# Patient Record
Sex: Male | Born: 1940
Health system: Southern US, Community
[De-identification: ages and names within clinical notes are randomized; demographics above are authoritative.]

## PROBLEM LIST (undated history)

## (undated) DIAGNOSIS — N200 Calculus of kidney: Secondary | ICD-10-CM

## (undated) DIAGNOSIS — G4700 Insomnia, unspecified: Secondary | ICD-10-CM

## (undated) DIAGNOSIS — K5792 Diverticulitis of intestine, part unspecified, without perforation or abscess without bleeding: Secondary | ICD-10-CM

## (undated) DIAGNOSIS — K5780 Diverticulitis of intestine, part unspecified, with perforation and abscess without bleeding: Secondary | ICD-10-CM

## (undated) DIAGNOSIS — Z95828 Presence of other vascular implants and grafts: Secondary | ICD-10-CM

## (undated) DIAGNOSIS — S32010A Wedge compression fracture of first lumbar vertebra, initial encounter for closed fracture: Secondary | ICD-10-CM

## (undated) DIAGNOSIS — K7460 Unspecified cirrhosis of liver: Secondary | ICD-10-CM

## (undated) DIAGNOSIS — I82409 Acute embolism and thrombosis of unspecified deep veins of unspecified lower extremity: Secondary | ICD-10-CM

## (undated) DIAGNOSIS — Z87442 Personal history of urinary calculi: Secondary | ICD-10-CM

## (undated) DIAGNOSIS — Z960 Presence of urogenital implants: Secondary | ICD-10-CM

## (undated) DIAGNOSIS — E785 Hyperlipidemia, unspecified: Secondary | ICD-10-CM

## (undated) DIAGNOSIS — I10 Essential (primary) hypertension: Secondary | ICD-10-CM

## (undated) DIAGNOSIS — R160 Hepatomegaly, not elsewhere classified: Secondary | ICD-10-CM

## (undated) DIAGNOSIS — I639 Cerebral infarction, unspecified: Secondary | ICD-10-CM

## (undated) DIAGNOSIS — R0600 Dyspnea, unspecified: Secondary | ICD-10-CM

## (undated) DIAGNOSIS — K862 Cyst of pancreas: Secondary | ICD-10-CM

## (undated) DIAGNOSIS — I2699 Other pulmonary embolism without acute cor pulmonale: Secondary | ICD-10-CM

## (undated) DIAGNOSIS — D6869 Other thrombophilia: Secondary | ICD-10-CM

## (undated) HISTORY — PX: HERNIA REPAIR: SHX51

## (undated) HISTORY — PX: TONSILLECTOMY AND ADENOIDECTOMY: SHX28

## (undated) HISTORY — DX: Presence of other vascular implants and grafts: Z95.828

## (undated) HISTORY — DX: Essential (primary) hypertension: I10

## (undated) HISTORY — DX: Cerebral infarction, unspecified: I63.9

## (undated) HISTORY — DX: Calculus of kidney: N20.0

## (undated) HISTORY — DX: Hyperlipidemia, unspecified: E78.5

## (undated) HISTORY — PX: BACK SURGERY: SHX140

## (undated) HISTORY — PX: KIDNEY STONE SURGERY: SHX686

## (undated) HISTORY — PX: CARPAL TUNNEL RELEASE: SHX101

---
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
## 1999-02-20 ENCOUNTER — Encounter: Admission: RE | Admit: 1999-02-20 | Discharge: 1999-03-17 | Payer: Self-pay | Admitting: Anesthesiology

## 1999-05-09 ENCOUNTER — Encounter: Payer: Self-pay | Admitting: Orthopedic Surgery

## 1999-05-09 ENCOUNTER — Ambulatory Visit (HOSPITAL_COMMUNITY): Admission: RE | Admit: 1999-05-09 | Discharge: 1999-05-09 | Payer: Self-pay | Admitting: Orthopedic Surgery

## 1999-05-21 ENCOUNTER — Ambulatory Visit (HOSPITAL_COMMUNITY): Admission: RE | Admit: 1999-05-21 | Discharge: 1999-05-21 | Payer: Self-pay | Admitting: Orthopedic Surgery

## 1999-05-21 ENCOUNTER — Encounter: Payer: Self-pay | Admitting: Orthopedic Surgery

## 1999-06-04 ENCOUNTER — Ambulatory Visit (HOSPITAL_COMMUNITY): Admission: RE | Admit: 1999-06-04 | Discharge: 1999-06-04 | Payer: Self-pay | Admitting: Orthopedic Surgery

## 1999-06-04 ENCOUNTER — Encounter: Payer: Self-pay | Admitting: Orthopedic Surgery

## 1999-06-16 ENCOUNTER — Encounter: Payer: Self-pay | Admitting: Orthopedic Surgery

## 1999-06-16 ENCOUNTER — Ambulatory Visit (HOSPITAL_COMMUNITY): Admission: RE | Admit: 1999-06-16 | Discharge: 1999-06-16 | Payer: Self-pay | Admitting: Orthopedic Surgery

## 1999-08-05 ENCOUNTER — Ambulatory Visit (HOSPITAL_COMMUNITY): Admission: RE | Admit: 1999-08-05 | Discharge: 1999-08-05 | Payer: Self-pay | Admitting: Orthopedic Surgery

## 1999-08-05 ENCOUNTER — Encounter: Payer: Self-pay | Admitting: Orthopedic Surgery

## 1999-08-28 ENCOUNTER — Encounter: Payer: Self-pay | Admitting: Orthopedic Surgery

## 1999-08-28 ENCOUNTER — Ambulatory Visit (HOSPITAL_COMMUNITY): Admission: RE | Admit: 1999-08-28 | Discharge: 1999-08-28 | Payer: Self-pay | Admitting: Orthopedic Surgery

## 2000-01-06 DIAGNOSIS — I82409 Acute embolism and thrombosis of unspecified deep veins of unspecified lower extremity: Secondary | ICD-10-CM

## 2000-01-06 DIAGNOSIS — Z95828 Presence of other vascular implants and grafts: Secondary | ICD-10-CM

## 2000-01-06 DIAGNOSIS — I639 Cerebral infarction, unspecified: Secondary | ICD-10-CM

## 2000-01-06 DIAGNOSIS — I2699 Other pulmonary embolism without acute cor pulmonale: Secondary | ICD-10-CM

## 2000-01-06 HISTORY — DX: Presence of other vascular implants and grafts: Z95.828

## 2000-01-06 HISTORY — DX: Cerebral infarction, unspecified: I63.9

## 2000-01-06 HISTORY — DX: Acute embolism and thrombosis of unspecified deep veins of unspecified lower extremity: I82.409

## 2000-01-06 HISTORY — DX: Other pulmonary embolism without acute cor pulmonale: I26.99

## 2000-01-12 ENCOUNTER — Encounter: Payer: Self-pay | Admitting: Orthopedic Surgery

## 2000-01-13 ENCOUNTER — Inpatient Hospital Stay (HOSPITAL_COMMUNITY): Admission: RE | Admit: 2000-01-13 | Discharge: 2000-01-18 | Payer: Self-pay | Admitting: Orthopedic Surgery

## 2000-01-13 ENCOUNTER — Encounter: Payer: Self-pay | Admitting: Orthopedic Surgery

## 2000-07-30 ENCOUNTER — Inpatient Hospital Stay (HOSPITAL_COMMUNITY): Admission: EM | Admit: 2000-07-30 | Discharge: 2000-08-18 | Payer: Self-pay

## 2000-07-30 ENCOUNTER — Encounter: Payer: Self-pay | Admitting: Emergency Medicine

## 2000-07-31 ENCOUNTER — Encounter: Payer: Self-pay | Admitting: Internal Medicine

## 2000-08-01 ENCOUNTER — Encounter: Payer: Self-pay | Admitting: Internal Medicine

## 2000-08-02 ENCOUNTER — Encounter: Payer: Self-pay | Admitting: Internal Medicine

## 2000-08-03 ENCOUNTER — Encounter: Payer: Self-pay | Admitting: Internal Medicine

## 2000-08-04 ENCOUNTER — Encounter: Payer: Self-pay | Admitting: Internal Medicine

## 2000-08-04 ENCOUNTER — Encounter: Payer: Self-pay | Admitting: Oncology

## 2000-08-05 ENCOUNTER — Encounter: Payer: Self-pay | Admitting: Internal Medicine

## 2000-08-09 ENCOUNTER — Encounter: Payer: Self-pay | Admitting: Internal Medicine

## 2000-10-05 ENCOUNTER — Encounter: Admission: RE | Admit: 2000-10-05 | Discharge: 2001-01-03 | Payer: Self-pay | Admitting: Family Medicine

## 2001-01-22 ENCOUNTER — Emergency Department (HOSPITAL_COMMUNITY): Admission: EM | Admit: 2001-01-22 | Discharge: 2001-01-22 | Payer: Self-pay | Admitting: Emergency Medicine

## 2001-04-26 ENCOUNTER — Inpatient Hospital Stay (HOSPITAL_COMMUNITY): Admission: EM | Admit: 2001-04-26 | Discharge: 2001-05-06 | Payer: Self-pay | Admitting: Internal Medicine

## 2001-04-29 ENCOUNTER — Encounter: Payer: Self-pay | Admitting: Urology

## 2001-05-01 ENCOUNTER — Encounter: Payer: Self-pay | Admitting: Internal Medicine

## 2001-05-03 ENCOUNTER — Encounter: Payer: Self-pay | Admitting: Urology

## 2001-05-05 ENCOUNTER — Encounter: Payer: Self-pay | Admitting: Internal Medicine

## 2001-09-23 ENCOUNTER — Ambulatory Visit (HOSPITAL_COMMUNITY): Admission: RE | Admit: 2001-09-23 | Discharge: 2001-09-23 | Payer: Self-pay | Admitting: Orthopedic Surgery

## 2001-09-25 ENCOUNTER — Inpatient Hospital Stay (HOSPITAL_COMMUNITY): Admission: AD | Admit: 2001-09-25 | Discharge: 2001-10-01 | Payer: Self-pay | Admitting: Internal Medicine

## 2001-09-26 ENCOUNTER — Encounter: Payer: Self-pay | Admitting: Internal Medicine

## 2002-05-17 ENCOUNTER — Encounter: Payer: Self-pay | Admitting: Family Medicine

## 2002-05-17 ENCOUNTER — Ambulatory Visit (HOSPITAL_COMMUNITY): Admission: RE | Admit: 2002-05-17 | Discharge: 2002-05-17 | Payer: Self-pay | Admitting: Family Medicine

## 2003-04-13 ENCOUNTER — Emergency Department (HOSPITAL_COMMUNITY): Admission: EM | Admit: 2003-04-13 | Discharge: 2003-04-13 | Payer: Self-pay | Admitting: Emergency Medicine

## 2003-10-23 ENCOUNTER — Encounter: Admission: RE | Admit: 2003-10-23 | Discharge: 2003-12-11 | Payer: Self-pay | Admitting: Family Medicine

## 2006-11-27 ENCOUNTER — Emergency Department (HOSPITAL_COMMUNITY): Admission: EM | Admit: 2006-11-27 | Discharge: 2006-11-27 | Payer: Self-pay | Admitting: *Deleted

## 2008-01-06 HISTORY — PX: LITHOTRIPSY: SUR834

## 2008-01-17 ENCOUNTER — Encounter: Payer: Self-pay | Admitting: Urology

## 2008-01-17 ENCOUNTER — Ambulatory Visit (HOSPITAL_BASED_OUTPATIENT_CLINIC_OR_DEPARTMENT_OTHER): Admission: RE | Admit: 2008-01-17 | Discharge: 2008-01-17 | Payer: Self-pay | Admitting: Urology

## 2009-01-05 HISTORY — PX: OTHER SURGICAL HISTORY: SHX169

## 2009-06-09 ENCOUNTER — Inpatient Hospital Stay (HOSPITAL_COMMUNITY): Admission: EM | Admit: 2009-06-09 | Discharge: 2009-06-23 | Payer: Self-pay | Admitting: Emergency Medicine

## 2009-06-28 ENCOUNTER — Encounter: Admission: RE | Admit: 2009-06-28 | Discharge: 2009-06-28 | Payer: Self-pay | Admitting: Obstetrics and Gynecology

## 2009-07-12 ENCOUNTER — Encounter: Payer: Self-pay | Admitting: Surgery

## 2009-07-12 ENCOUNTER — Inpatient Hospital Stay (HOSPITAL_COMMUNITY): Admission: EM | Admit: 2009-07-12 | Discharge: 2009-07-26 | Payer: Self-pay | Admitting: Emergency Medicine

## 2009-07-19 ENCOUNTER — Encounter (INDEPENDENT_AMBULATORY_CARE_PROVIDER_SITE_OTHER): Payer: Self-pay | Admitting: Surgery

## 2009-07-19 HISTORY — PX: COLON SURGERY: SHX602

## 2010-01-31 LAB — COMPREHENSIVE METABOLIC PANEL
ALT: 52 U/L (ref 0–53)
Alkaline Phosphatase: 42 U/L (ref 39–117)
CO2: 28 mEq/L (ref 19–32)
Calcium: 9.8 mg/dL (ref 8.4–10.5)
Chloride: 105 mEq/L (ref 96–112)
GFR calc non Af Amer: 55 mL/min — ABNORMAL LOW (ref >60)
Total Bilirubin: 0.7 mg/dL (ref 0.3–1.2)
Total Protein: 7.5 g/dL (ref 6.0–8.3)

## 2010-01-31 LAB — CBC
Hemoglobin: 14.7 g/dL (ref 13.0–17.0)
MCH: 30.9 pg (ref 26.0–34.0)
MCV: 89.7 fL (ref 78.0–100.0)
RDW: 14.5 % (ref 11.5–15.5)
WBC: 8.5 10*3/uL (ref 4.0–10.5)

## 2010-01-31 LAB — DIFFERENTIAL
Eosinophils Absolute: 0.3 10*3/uL (ref 0.0–0.7)
Eosinophils Relative: 3 % (ref 0–5)
Lymphocytes Relative: 26 % (ref 12–46)
Monocytes Absolute: 0.7 10*3/uL (ref 0.1–1.0)
Neutrophils Relative %: 63 % (ref 43–77)

## 2010-01-31 LAB — SURGICAL PCR SCREEN: Staphylococcus aureus: POSITIVE — AB

## 2010-02-03 ENCOUNTER — Inpatient Hospital Stay (HOSPITAL_COMMUNITY)
Admission: RE | Admit: 2010-02-03 | Discharge: 2010-02-06 | DRG: 336 | Disposition: A | Discharge status: Home / Self Care | Payer: Medicare HMO | Attending: Surgery | Admitting: Surgery

## 2010-02-03 DIAGNOSIS — K432 Incisional hernia without obstruction or gangrene: Principal | ICD-10-CM | POA: Diagnosis present

## 2010-02-03 DIAGNOSIS — Z8673 Personal history of transient ischemic attack (TIA), and cerebral infarction without residual deficits: Secondary | ICD-10-CM

## 2010-02-03 DIAGNOSIS — I10 Essential (primary) hypertension: Secondary | ICD-10-CM | POA: Diagnosis present

## 2010-02-03 DIAGNOSIS — Z7901 Long term (current) use of anticoagulants: Secondary | ICD-10-CM

## 2010-02-03 DIAGNOSIS — K660 Peritoneal adhesions (postprocedural) (postinfection): Secondary | ICD-10-CM | POA: Diagnosis present

## 2010-02-03 DIAGNOSIS — E7800 Pure hypercholesterolemia, unspecified: Secondary | ICD-10-CM | POA: Diagnosis present

## 2010-02-03 DIAGNOSIS — N200 Calculus of kidney: Secondary | ICD-10-CM | POA: Diagnosis present

## 2010-02-03 DIAGNOSIS — D6859 Other primary thrombophilia: Secondary | ICD-10-CM | POA: Diagnosis present

## 2010-02-03 DIAGNOSIS — Z9049 Acquired absence of other specified parts of digestive tract: Secondary | ICD-10-CM

## 2010-02-03 DIAGNOSIS — E119 Type 2 diabetes mellitus without complications: Secondary | ICD-10-CM | POA: Diagnosis present

## 2010-02-03 LAB — GLUCOSE, CAPILLARY: Glucose-Capillary: 142 mg/dL — ABNORMAL HIGH (ref 70–99)

## 2010-02-03 LAB — PROTIME-INR: Prothrombin Time: 15.1 seconds (ref 11.6–15.2)

## 2010-02-03 LAB — APTT: aPTT: 32 seconds (ref 24–37)

## 2010-02-04 LAB — GLUCOSE, CAPILLARY: Glucose-Capillary: 117 mg/dL — ABNORMAL HIGH (ref 70–99)

## 2010-02-05 LAB — DIFFERENTIAL
Eosinophils Relative: 4 % (ref 0–5)
Lymphocytes Relative: 17 % (ref 12–46)
Monocytes Relative: 10 % (ref 3–12)
Neutro Abs: 6 10*3/uL (ref 1.7–7.7)

## 2010-02-05 LAB — BASIC METABOLIC PANEL
BUN: 9 mg/dL (ref 6–23)
CO2: 25 mEq/L (ref 19–32)
GFR calc Af Amer: >60 mL/min (ref >60)
GFR calc non Af Amer: >60 mL/min (ref >60)
Glucose, Bld: 133 mg/dL — ABNORMAL HIGH (ref 70–99)
Potassium: 3.9 mEq/L (ref 3.5–5.1)

## 2010-02-05 LAB — CBC
HCT: 38.9 % — ABNORMAL LOW (ref 39.0–52.0)
RDW: 14.7 % (ref 11.5–15.5)
WBC: 8.7 10*3/uL (ref 4.0–10.5)

## 2010-02-05 LAB — GLUCOSE, CAPILLARY: Glucose-Capillary: 100 mg/dL — ABNORMAL HIGH (ref 70–99)

## 2010-02-06 LAB — GLUCOSE, CAPILLARY
Glucose-Capillary: 138 mg/dL — ABNORMAL HIGH (ref 70–99)
Glucose-Capillary: 139 mg/dL — ABNORMAL HIGH (ref 70–99)
Glucose-Capillary: 161 mg/dL — ABNORMAL HIGH (ref 70–99)
Glucose-Capillary: 237 mg/dL — ABNORMAL HIGH (ref 70–99)

## 2010-03-18 NOTE — Discharge Summary (Signed)
[name], [name]                   ACCOUNT NO.:  [id number]  MEDICAL RECORD NO.:  [id number]          PATIENT TYPE:  INP  LOCATION:  1523                         FACILITY:  [facility]  PHYSICIAN:  [name], M.D.  DATE OF BIRTH:  10-05-1940  DATE OF ADMISSION:  02/03/2010 DATE OF DISCHARGE:  02/06/2010                              DISCHARGE SUMMARY   DISCHARGE DIAGNOSES: 1. Ventral incisional hernia. 2. Multiple intra-abdominal adhesions. 3. Status post colectomy for diverticular disease. 4. History of nephrolithiasis. 5. History of stroke in 2002. 6. On chronic Coumadin for hypercoagulable state. 7. History of inferior vena cava filter. 8. Morbid obesity. 9. Diabetes mellitus. 10.Hypertension. 11.Hypercholesterolemia.  OPERATION PERFORMED:  The patient had a laparoscopic ventral incisional hernia repair with mesh and enterolysis of adhesions for 45 minutes by Dr. Algis Downs. Ezzard Standing on 03 February 2010.  HISTORY OF ILLNESS:  [name] is a 70 year old white male who sees Dr. Miguel Aschoff as a primary medical doctor.  He did see Dr. Lanell Persons before he left.  He had a sigmoid colectomy on July 19, 2009, for complicated diverticular disease.  Postoperatively, he developed abdominal wall hernia with increasing bulge of his abdomen.  It has caused minimal pain.  He has also added some 20 pounds since his surgery, this has contributed to development of hernia.  Besides his colon surgery, he did have an appendectomy through right upper quadrant incision, then kidney stones operated through the same incisions some 15 to 20 years ago and looks like he has a recurrent of hernia in the right lower quadrant on right flank for which he saw Dr. Zachery Dakins who repaired this hernia through an incision in the past.  His comorbid problems include: 1. He had a stroke in 2002 from which he has recovered. 2. He was diagnosed with a hypercoagulable state  for which he is on     chronic Coumadin.   This was stopped 5 days before surgery. 3. He has his right lower quadrant abdominal hernia separate from the     ventral hernia. 4. He is morbidly obese. 5. He has non-insulin-dependent diabetes mellitus. 6. He has hypertension. 7. He has hypercholesterolemia. 8. He has nephrolithiasis, followed by Dr. Bjorn Pippin.  On the day of admission, the patient was taken to the operating room where he underwent a laparoscopic repair of his ventral incisional hernia.  I spent about 45 minutes doing enterolysis of adhesions through the anterior abdominal wall.  Of note, he does have this right, sort of, lower quadrant flank hernia. It was not amendable to repair at the same time.  He has had it for long time.  It is not really bothering.  So, I just fixed the midline hernia at this operation.  Postoperatively, he was kept on subcu heparin for VTE prophylaxis.  His labs on the second postop day showed white blood count of 8700  and a creatinine of 0.8.  By the third postoperative day, he is tolerating regular diet.  He is passing gas.  His abdominal pain is better controlled.  He is doing well.  His incisions  all look good and he is ready for discharge.  DISCHARGE INSTRUCTIONS: 1. His discharge instructions will include he did not have a job to     get back to.  He will be given Vicodin for pain.  He will resume     his home medications which include Coumadin, he will restart on     Sunday, February 09, 2010; and then check with Dr. Ross within 7 to     10  days for lab checks. 2. Medicine, will resume his Cozaar 100 mg daily. 3. He will restart his glimepiride 8 mg daily. 4. He will restart hydrochlorothiazide 25 mg daily. 5. He will restart metformin 1000 mg XR twice daily.  He has Tylenol     for pain and Zocor, cholesterol medicine.  He can shower.  He should not drive for 4 to 5 days.  [name].  He knows to call for interval problem.   [name],  M.D., FACS   DHN/MEDQ  D:  02/06/2010  T:  02/06/2010  Job:  [id number]  cc:   Miguel Aschoff, M.D.  Electronically Signed by Ovidio Kin M.D. on 03/18/2010 07:21:59 PM

## 2010-03-18 NOTE — Op Note (Signed)
[name], [name]                   ACCOUNT NO.:  [id number]  MEDICAL RECORD NO.:  [id number]          PATIENT TYPE:  INP  LOCATION:  0012                         FACILITY:  [facility]  PHYSICIAN:  [name], M.D.  DATE OF BIRTH:  06/01/1940  DATE OF PROCEDURE:  03 February 2010                              OPERATIVE REPORT   PREOPERATIVE DIAGNOSIS:  Ventral incisional hernia, midline (9 x 15 cm).  POSTOPERATIVE DIAGNOSIS:  Ventral incisional hernia, midline (9 x 15 cm), right flank incisional hernia.  PROCEDURE:  Laparoscopic repair of ventral incisional hernia with 15 x 25 cm Parietex mesh, and lysis of adhesions 45 minutes.  SURGEON:  [name], M.D.  FIRST ASSISTANT:  None.  ANESTHESIA:  General endotracheal with 30 mL of 0.25% Marcaine.  COMPLICATIONS:  None.  INDICATIONS FOR PROCEDURE:  [name] is a 70 year old, white male who sees Dr. Duane Lope as his primary medical doctor.  He had a sigmoid colectomy on July 19, 2009 by Dr. Ovidio Kin and has developed abdominal hernias since that operation.  He now comes for repair of this hernia.  I discussed with him the indications and potential complications of hernia repair.  Potential complications include, but are not limited to, bleeding, infection, nerve injury, recurrence of the hernia.  He also has this right flank hernia that I will look at.  I doubt if I will try to repair this at the same time.  He has had this hernia for some period of time, and my guess it will take  a different approach to access that hernia.  OPERATIVE NOTE:  The patient was placed in the supine position and given a general endotracheal anesthetic.  His right arm was out, his left arm tucked to his side, a Foley catheter was placed, and he was given 1 gram of Ancef at initiation of procedure.  His abdomen was prepped with ChloraPrep and sterilely draped with Ioban drape.  A time-out was held, and a surgical checklist run.  I  accessed the abdominal cavity through the right upper quadrant and a 11-mm Ethicon Optiview trocar.  I then placed a 5-mm on the right lateral abdominal wall and a 5-mm on the left lateral abdominal wall.  I spent about 45 minutes taking down primarily omentum off the anterior wall and out of the hernia defects.  He actually had sort of a combined defect, which included the upper half was more of a Swiss cheese sort of defect, and the lower half more of just an open breakdown of his abdominal wall.  Then in the left lower quadrant, he had about a 1 cm hole that may have been an old trocar site that had herniated.  This was fairly small, and I put a single stitch in this, and then in the right abdomen where he had colon sores stuck along this,  he had this right lower flank hernia that I only took enough adhesions down to get my sutures in but did not try to expose or visualize this hernia.  After taking down the omentum from the hernia, the  hernia measured approximately 15 cm in total.  Again, the upper half of this was more Swiss cheese.  The bottom half was about 8 or 9 cm wide, maybe about 8 or 9 cm in length.  I used a #1 Novafil to parachute the lower incision closed.  I used three of these sutures to pull this together.  After pulling down the midline incision, I then used a piece of 20 x 25 cm Parietex, and I cut the 15 x 25 cm and I placed 10 sutures in a clockwise fashion in this mesh.  The mesh was introduced in the abdominal cavity.  These retention sutures, which was 0 Novafil were retrieved through the abdominal wall to do a transabdominal fixation.   I then used a combination of the ProTack metal stapler with the absorbable Secure Strap staple.  I used a 25 of the Secure Strap and 24 of the ProTack to tack the mesh up to the abdominal wall.  I tried to go around the edge of the mesh about every centimeter to a centimeter and a half. I desufflated the abdomen twice to look  for any defects around the edges of the mesh in which I could find none.  I then removed all the trocars in turn.  The three incisions were closed with 5-0 Vicryl suture, cleaned with Benzoin and Steri-Strips.  He had 13 puncture wounds, 3 for the midline incision and 10 for the mesh retention sutures, and these wounds were Steri-Stripped.  Sponge and needle count were correct in the case.  The patient was transferred to recovery room in good condition.  The patient tolerated the procedure well.   [name], M.D., FACS   DHN/MEDQ  D:  02/03/2010  T:  02/03/2010  Job:  [id number]  cc:   C. Duane Lope, M.D. Fax: [phone]  Electronically Signed by Ovidio Kin M.D. on 03/18/2010 07:20:22 PM

## 2010-03-22 LAB — CBC
HCT: 32.3 % — ABNORMAL LOW (ref 39.0–52.0)
HCT: 38 % — ABNORMAL LOW (ref 39.0–52.0)
MCHC: 33.6 g/dL (ref 30.0–36.0)
MCV: 91.2 fL (ref 78.0–100.0)
Platelets: 245 10*3/uL (ref 150–400)
Platelets: 253 10*3/uL (ref 150–400)
Platelets: 353 10*3/uL (ref 150–400)
RBC: 3.54 MIL/uL — ABNORMAL LOW (ref 4.22–5.81)
RDW: 14.2 % (ref 11.5–15.5)
RDW: 14.3 % (ref 11.5–15.5)
RDW: 14.5 % (ref 11.5–15.5)
WBC: 13.2 10*3/uL — ABNORMAL HIGH (ref 4.0–10.5)
WBC: 9.6 10*3/uL (ref 4.0–10.5)

## 2010-03-22 LAB — GLUCOSE, CAPILLARY
Glucose-Capillary: 108 mg/dL — ABNORMAL HIGH (ref 70–99)
Glucose-Capillary: 112 mg/dL — ABNORMAL HIGH (ref 70–99)
Glucose-Capillary: 116 mg/dL — ABNORMAL HIGH (ref 70–99)
Glucose-Capillary: 116 mg/dL — ABNORMAL HIGH (ref 70–99)
Glucose-Capillary: 118 mg/dL — ABNORMAL HIGH (ref 70–99)
Glucose-Capillary: 120 mg/dL — ABNORMAL HIGH (ref 70–99)
Glucose-Capillary: 129 mg/dL — ABNORMAL HIGH (ref 70–99)
Glucose-Capillary: 131 mg/dL — ABNORMAL HIGH (ref 70–99)
Glucose-Capillary: 131 mg/dL — ABNORMAL HIGH (ref 70–99)
Glucose-Capillary: 134 mg/dL — ABNORMAL HIGH (ref 70–99)
Glucose-Capillary: 135 mg/dL — ABNORMAL HIGH (ref 70–99)
Glucose-Capillary: 141 mg/dL — ABNORMAL HIGH (ref 70–99)
Glucose-Capillary: 141 mg/dL — ABNORMAL HIGH (ref 70–99)
Glucose-Capillary: 143 mg/dL — ABNORMAL HIGH (ref 70–99)
Glucose-Capillary: 144 mg/dL — ABNORMAL HIGH (ref 70–99)
Glucose-Capillary: 150 mg/dL — ABNORMAL HIGH (ref 70–99)
Glucose-Capillary: 157 mg/dL — ABNORMAL HIGH (ref 70–99)
Glucose-Capillary: 185 mg/dL — ABNORMAL HIGH (ref 70–99)
Glucose-Capillary: 81 mg/dL (ref 70–99)
Glucose-Capillary: 83 mg/dL (ref 70–99)
Glucose-Capillary: 84 mg/dL (ref 70–99)
Glucose-Capillary: 95 mg/dL (ref 70–99)

## 2010-03-22 LAB — DIFFERENTIAL
Basophils Absolute: 0 10*3/uL (ref 0.0–0.1)
Eosinophils Relative: 0 % (ref 0–5)
Lymphocytes Relative: 8 % — ABNORMAL LOW (ref 12–46)
Lymphs Abs: 1.1 10*3/uL (ref 0.7–4.0)
Neutro Abs: 11.2 10*3/uL — ABNORMAL HIGH (ref 1.7–7.7)

## 2010-03-22 LAB — BASIC METABOLIC PANEL
BUN: 7 mg/dL (ref 6–23)
BUN: 8 mg/dL (ref 6–23)
CO2: 27 mEq/L (ref 19–32)
Creatinine, Ser: 0.7 mg/dL (ref 0.4–1.5)
Creatinine, Ser: 0.92 mg/dL (ref 0.4–1.5)
GFR calc non Af Amer: >60 mL/min (ref >60)
GFR calc non Af Amer: >60 mL/min (ref >60)
Glucose, Bld: 144 mg/dL — ABNORMAL HIGH (ref 70–99)
Glucose, Bld: 155 mg/dL — ABNORMAL HIGH (ref 70–99)
Potassium: 3.8 mEq/L (ref 3.5–5.1)
Potassium: 4.3 mEq/L (ref 3.5–5.1)
Sodium: 135 mEq/L (ref 135–145)

## 2010-03-22 LAB — TYPE AND SCREEN: ABO/RH(D): O POS

## 2010-03-23 LAB — BASIC METABOLIC PANEL
BUN: 10 mg/dL (ref 6–23)
BUN: 11 mg/dL (ref 6–23)
BUN: 14 mg/dL (ref 6–23)
BUN: 18 mg/dL (ref 6–23)
BUN: 21 mg/dL (ref 6–23)
CO2: 26 mEq/L (ref 19–32)
CO2: 26 mEq/L (ref 19–32)
Calcium: 8.2 mg/dL — ABNORMAL LOW (ref 8.4–10.5)
Calcium: 8.3 mg/dL — ABNORMAL LOW (ref 8.4–10.5)
Calcium: 8.9 mg/dL (ref 8.4–10.5)
Calcium: 9.2 mg/dL (ref 8.4–10.5)
Chloride: 107 mEq/L (ref 96–112)
Creatinine, Ser: 0.81 mg/dL (ref 0.4–1.5)
Creatinine, Ser: 0.84 mg/dL (ref 0.4–1.5)
Creatinine, Ser: 0.9 mg/dL (ref 0.4–1.5)
Creatinine, Ser: 0.97 mg/dL (ref 0.4–1.5)
GFR calc Af Amer: >60 mL/min (ref >60)
GFR calc non Af Amer: >60 mL/min (ref >60)
GFR calc non Af Amer: >60 mL/min (ref >60)
GFR calc non Af Amer: >60 mL/min (ref >60)
Glucose, Bld: 125 mg/dL — ABNORMAL HIGH (ref 70–99)
Glucose, Bld: 46 mg/dL — ABNORMAL LOW (ref 70–99)
Glucose, Bld: 90 mg/dL (ref 70–99)
Potassium: 3.5 mEq/L (ref 3.5–5.1)
Potassium: 3.7 mEq/L (ref 3.5–5.1)
Sodium: 134 mEq/L — ABNORMAL LOW (ref 135–145)

## 2010-03-23 LAB — DIFFERENTIAL
Basophils Absolute: 0.1 10*3/uL (ref 0.0–0.1)
Basophils Absolute: 0.1 10*3/uL (ref 0.0–0.1)
Basophils Relative: 1 % (ref 0–1)
Eosinophils Absolute: 0 10*3/uL (ref 0.0–0.7)
Eosinophils Relative: 0 % (ref 0–5)
Lymphocytes Relative: 11 % — ABNORMAL LOW (ref 12–46)
Lymphocytes Relative: 11 % — ABNORMAL LOW (ref 12–46)
Lymphs Abs: 1.2 10*3/uL (ref 0.7–4.0)
Neutro Abs: 8.6 10*3/uL — ABNORMAL HIGH (ref 1.7–7.7)
Neutrophils Relative %: 77 % (ref 43–77)
Neutrophils Relative %: 78 % — ABNORMAL HIGH (ref 43–77)

## 2010-03-23 LAB — CBC
HCT: 34.9 % — ABNORMAL LOW (ref 39.0–52.0)
HCT: 36.4 % — ABNORMAL LOW (ref 39.0–52.0)
HCT: 38.2 % — ABNORMAL LOW (ref 39.0–52.0)
Hemoglobin: 12.8 g/dL — ABNORMAL LOW (ref 13.0–17.0)
MCH: 31.1 pg (ref 26.0–34.0)
MCH: 31.4 pg (ref 26.0–34.0)
MCHC: 34 g/dL (ref 30.0–36.0)
MCHC: 34.6 g/dL (ref 30.0–36.0)
MCHC: 35.3 g/dL (ref 30.0–36.0)
MCV: 91.2 fL (ref 78.0–100.0)
Platelets: 262 10*3/uL (ref 150–400)
Platelets: 335 10*3/uL (ref 150–400)
Platelets: 341 10*3/uL (ref 150–400)
RBC: 3.99 MIL/uL — ABNORMAL LOW (ref 4.22–5.81)
RBC: 4.34 MIL/uL (ref 4.22–5.81)
RDW: 13 % (ref 11.5–15.5)
RDW: 13.1 % (ref 11.5–15.5)
RDW: 13.7 % (ref 11.5–15.5)
RDW: 14.1 % (ref 11.5–15.5)
RDW: 14.1 % (ref 11.5–15.5)
WBC: 11.1 10*3/uL — ABNORMAL HIGH (ref 4.0–10.5)
WBC: 11.1 10*3/uL — ABNORMAL HIGH (ref 4.0–10.5)
WBC: 6.9 10*3/uL (ref 4.0–10.5)
WBC: 7.7 10*3/uL (ref 4.0–10.5)

## 2010-03-23 LAB — GLUCOSE, CAPILLARY
Glucose-Capillary: 105 mg/dL — ABNORMAL HIGH (ref 70–99)
Glucose-Capillary: 106 mg/dL — ABNORMAL HIGH (ref 70–99)
Glucose-Capillary: 111 mg/dL — ABNORMAL HIGH (ref 70–99)
Glucose-Capillary: 125 mg/dL — ABNORMAL HIGH (ref 70–99)
Glucose-Capillary: 130 mg/dL — ABNORMAL HIGH (ref 70–99)
Glucose-Capillary: 134 mg/dL — ABNORMAL HIGH (ref 70–99)
Glucose-Capillary: 139 mg/dL — ABNORMAL HIGH (ref 70–99)
Glucose-Capillary: 140 mg/dL — ABNORMAL HIGH (ref 70–99)
Glucose-Capillary: 150 mg/dL — ABNORMAL HIGH (ref 70–99)
Glucose-Capillary: 157 mg/dL — ABNORMAL HIGH (ref 70–99)
Glucose-Capillary: 223 mg/dL — ABNORMAL HIGH (ref 70–99)
Glucose-Capillary: 225 mg/dL — ABNORMAL HIGH (ref 70–99)
Glucose-Capillary: 37 mg/dL — CL (ref 70–99)
Glucose-Capillary: 41 mg/dL — CL (ref 70–99)
Glucose-Capillary: 54 mg/dL — ABNORMAL LOW (ref 70–99)
Glucose-Capillary: 54 mg/dL — ABNORMAL LOW (ref 70–99)
Glucose-Capillary: 83 mg/dL (ref 70–99)
Glucose-Capillary: 88 mg/dL (ref 70–99)
Glucose-Capillary: 92 mg/dL (ref 70–99)
Glucose-Capillary: 96 mg/dL (ref 70–99)
Glucose-Capillary: 97 mg/dL (ref 70–99)

## 2010-03-23 LAB — HEMOGLOBIN A1C
Hgb A1c MFr Bld: 6.2 % — ABNORMAL HIGH (ref <5.7)
Mean Plasma Glucose: 131 mg/dL — ABNORMAL HIGH (ref <117)

## 2010-03-23 LAB — CULTURE, ROUTINE-ABSCESS

## 2010-03-23 LAB — PROTIME-INR
INR: 1.52 — ABNORMAL HIGH (ref 0.00–1.49)
Prothrombin Time: 16.2 seconds — ABNORMAL HIGH (ref 11.6–15.2)
Prothrombin Time: 19.9 seconds — ABNORMAL HIGH (ref 11.6–15.2)

## 2010-03-23 LAB — APTT: aPTT: 37 seconds (ref 24–37)

## 2010-03-24 LAB — GLUCOSE, CAPILLARY
Glucose-Capillary: 106 mg/dL — ABNORMAL HIGH (ref 70–99)
Glucose-Capillary: 106 mg/dL — ABNORMAL HIGH (ref 70–99)
Glucose-Capillary: 109 mg/dL — ABNORMAL HIGH (ref 70–99)
Glucose-Capillary: 111 mg/dL — ABNORMAL HIGH (ref 70–99)
Glucose-Capillary: 111 mg/dL — ABNORMAL HIGH (ref 70–99)
Glucose-Capillary: 119 mg/dL — ABNORMAL HIGH (ref 70–99)
Glucose-Capillary: 122 mg/dL — ABNORMAL HIGH (ref 70–99)
Glucose-Capillary: 122 mg/dL — ABNORMAL HIGH (ref 70–99)
Glucose-Capillary: 125 mg/dL — ABNORMAL HIGH (ref 70–99)
Glucose-Capillary: 129 mg/dL — ABNORMAL HIGH (ref 70–99)
Glucose-Capillary: 135 mg/dL — ABNORMAL HIGH (ref 70–99)
Glucose-Capillary: 136 mg/dL — ABNORMAL HIGH (ref 70–99)
Glucose-Capillary: 136 mg/dL — ABNORMAL HIGH (ref 70–99)
Glucose-Capillary: 144 mg/dL — ABNORMAL HIGH (ref 70–99)
Glucose-Capillary: 162 mg/dL — ABNORMAL HIGH (ref 70–99)
Glucose-Capillary: 76 mg/dL (ref 70–99)
Glucose-Capillary: 84 mg/dL (ref 70–99)
Glucose-Capillary: 88 mg/dL (ref 70–99)
Glucose-Capillary: 90 mg/dL (ref 70–99)
Glucose-Capillary: 91 mg/dL (ref 70–99)
Glucose-Capillary: 92 mg/dL (ref 70–99)
Glucose-Capillary: 94 mg/dL (ref 70–99)
Glucose-Capillary: 95 mg/dL (ref 70–99)
Glucose-Capillary: 96 mg/dL (ref 70–99)

## 2010-03-24 LAB — CBC
HCT: 38.8 % — ABNORMAL LOW (ref 39.0–52.0)
HCT: 39 % (ref 39.0–52.0)
HCT: 39.4 % (ref 39.0–52.0)
HCT: 40.4 % (ref 39.0–52.0)
HCT: 41.1 % (ref 39.0–52.0)
HCT: 41.2 % (ref 39.0–52.0)
HCT: 41.3 % (ref 39.0–52.0)
HCT: 43.3 % (ref 39.0–52.0)
Hemoglobin: 13.2 g/dL (ref 13.0–17.0)
Hemoglobin: 13.2 g/dL (ref 13.0–17.0)
Hemoglobin: 13.4 g/dL (ref 13.0–17.0)
Hemoglobin: 13.6 g/dL (ref 13.0–17.0)
Hemoglobin: 13.8 g/dL (ref 13.0–17.0)
Hemoglobin: 14 g/dL (ref 13.0–17.0)
Hemoglobin: 14.4 g/dL (ref 13.0–17.0)
Hemoglobin: 14.6 g/dL (ref 13.0–17.0)
Hemoglobin: 15.3 g/dL (ref 13.0–17.0)
MCHC: 32.9 g/dL (ref 30.0–36.0)
MCHC: 33.2 g/dL (ref 30.0–36.0)
MCHC: 33.9 g/dL (ref 30.0–36.0)
MCHC: 34 g/dL (ref 30.0–36.0)
MCHC: 34 g/dL (ref 30.0–36.0)
MCHC: 34.1 g/dL (ref 30.0–36.0)
MCHC: 34.4 g/dL (ref 30.0–36.0)
MCV: 92.8 fL (ref 78.0–100.0)
MCV: 93.5 fL (ref 78.0–100.0)
MCV: 93.5 fL (ref 78.0–100.0)
MCV: 93.5 fL (ref 78.0–100.0)
MCV: 93.7 fL (ref 78.0–100.0)
MCV: 93.9 fL (ref 78.0–100.0)
MCV: 94 fL (ref 78.0–100.0)
Platelets: 172 10*3/uL (ref 150–400)
Platelets: 196 10*3/uL (ref 150–400)
Platelets: 223 10*3/uL (ref 150–400)
Platelets: 293 10*3/uL (ref 150–400)
Platelets: 371 10*3/uL (ref 150–400)
Platelets: 383 10*3/uL (ref 150–400)
Platelets: 393 10*3/uL (ref 150–400)
Platelets: 408 10*3/uL — ABNORMAL HIGH (ref 150–400)
RBC: 4.12 MIL/uL — ABNORMAL LOW (ref 4.22–5.81)
RBC: 4.15 MIL/uL — ABNORMAL LOW (ref 4.22–5.81)
RBC: 4.32 MIL/uL (ref 4.22–5.81)
RBC: 4.37 MIL/uL (ref 4.22–5.81)
RBC: 4.4 MIL/uL (ref 4.22–5.81)
RDW: 13.4 % (ref 11.5–15.5)
RDW: 13.7 % (ref 11.5–15.5)
RDW: 13.7 % (ref 11.5–15.5)
RDW: 13.7 % (ref 11.5–15.5)
RDW: 13.8 % (ref 11.5–15.5)
RDW: 13.9 % (ref 11.5–15.5)
RDW: 14 % (ref 11.5–15.5)
RDW: 14 % (ref 11.5–15.5)
WBC: 10 10*3/uL (ref 4.0–10.5)
WBC: 10.2 10*3/uL (ref 4.0–10.5)
WBC: 11.8 10*3/uL — ABNORMAL HIGH (ref 4.0–10.5)
WBC: 12.1 10*3/uL — ABNORMAL HIGH (ref 4.0–10.5)
WBC: 12.3 10*3/uL — ABNORMAL HIGH (ref 4.0–10.5)
WBC: 13.1 10*3/uL — ABNORMAL HIGH (ref 4.0–10.5)
WBC: 14.6 10*3/uL — ABNORMAL HIGH (ref 4.0–10.5)
WBC: 16.1 10*3/uL — ABNORMAL HIGH (ref 4.0–10.5)

## 2010-03-24 LAB — DIFFERENTIAL
Basophils Absolute: 0 10*3/uL (ref 0.0–0.1)
Basophils Absolute: 0 10*3/uL (ref 0.0–0.1)
Basophils Absolute: 0 10*3/uL (ref 0.0–0.1)
Basophils Absolute: 0.1 10*3/uL (ref 0.0–0.1)
Basophils Relative: 0 % (ref 0–1)
Basophils Relative: 0 % (ref 0–1)
Basophils Relative: 0 % (ref 0–1)
Basophils Relative: 0 % (ref 0–1)
Basophils Relative: 0 % (ref 0–1)
Basophils Relative: 0 % (ref 0–1)
Eosinophils Absolute: 0.1 10*3/uL (ref 0.0–0.7)
Eosinophils Absolute: 0.2 10*3/uL (ref 0.0–0.7)
Eosinophils Absolute: 0.2 10*3/uL (ref 0.0–0.7)
Eosinophils Absolute: 0.2 10*3/uL (ref 0.0–0.7)
Eosinophils Relative: 0 % (ref 0–5)
Eosinophils Relative: 2 % (ref 0–5)
Eosinophils Relative: 2 % (ref 0–5)
Eosinophils Relative: 2 % (ref 0–5)
Eosinophils Relative: 2 % (ref 0–5)
Lymphocytes Relative: 14 % (ref 12–46)
Lymphocytes Relative: 15 % (ref 12–46)
Lymphocytes Relative: 16 % (ref 12–46)
Lymphocytes Relative: 18 % (ref 12–46)
Lymphocytes Relative: 7 % — ABNORMAL LOW (ref 12–46)
Lymphocytes Relative: 9 % — ABNORMAL LOW (ref 12–46)
Lymphs Abs: 1 10*3/uL (ref 0.7–4.0)
Lymphs Abs: 1.5 10*3/uL (ref 0.7–4.0)
Lymphs Abs: 1.6 10*3/uL (ref 0.7–4.0)
Lymphs Abs: 1.7 10*3/uL (ref 0.7–4.0)
Lymphs Abs: 1.8 10*3/uL (ref 0.7–4.0)
Lymphs Abs: 1.9 10*3/uL (ref 0.7–4.0)
Monocytes Absolute: 0.4 10*3/uL (ref 0.1–1.0)
Monocytes Absolute: 0.6 10*3/uL (ref 0.1–1.0)
Monocytes Absolute: 0.7 10*3/uL (ref 0.1–1.0)
Monocytes Absolute: 0.7 10*3/uL (ref 0.1–1.0)
Monocytes Relative: 10 % (ref 3–12)
Monocytes Relative: 5 % (ref 3–12)
Monocytes Relative: 5 % (ref 3–12)
Monocytes Relative: 6 % (ref 3–12)
Monocytes Relative: 6 % (ref 3–12)
Neutro Abs: 11.5 10*3/uL — ABNORMAL HIGH (ref 1.7–7.7)
Neutro Abs: 13.1 10*3/uL — ABNORMAL HIGH (ref 1.7–7.7)
Neutro Abs: 13.2 10*3/uL — ABNORMAL HIGH (ref 1.7–7.7)
Neutro Abs: 9.2 10*3/uL — ABNORMAL HIGH (ref 1.7–7.7)
Neutrophils Relative %: 72 % (ref 43–77)
Neutrophils Relative %: 80 % — ABNORMAL HIGH (ref 43–77)
Neutrophils Relative %: 82 % — ABNORMAL HIGH (ref 43–77)
Neutrophils Relative %: 87 % — ABNORMAL HIGH (ref 43–77)

## 2010-03-24 LAB — PREPARE FRESH FROZEN PLASMA

## 2010-03-24 LAB — CULTURE, BLOOD (ROUTINE X 2)

## 2010-03-24 LAB — COMPREHENSIVE METABOLIC PANEL
ALT: 19 U/L (ref 0–53)
ALT: 52 U/L (ref 0–53)
AST: 41 U/L — ABNORMAL HIGH (ref 0–37)
Albumin: 3 g/dL — ABNORMAL LOW (ref 3.5–5.2)
Albumin: 3.1 g/dL — ABNORMAL LOW (ref 3.5–5.2)
Albumin: 4.2 g/dL (ref 3.5–5.2)
Alkaline Phosphatase: 29 U/L — ABNORMAL LOW (ref 39–117)
Alkaline Phosphatase: 31 U/L — ABNORMAL LOW (ref 39–117)
Alkaline Phosphatase: 42 U/L (ref 39–117)
Alkaline Phosphatase: 52 U/L (ref 39–117)
BUN: 12 mg/dL (ref 6–23)
BUN: 13 mg/dL (ref 6–23)
CO2: 26 mEq/L (ref 19–32)
Calcium: 9.5 mg/dL (ref 8.4–10.5)
Chloride: 103 mEq/L (ref 96–112)
Chloride: 99 mEq/L (ref 96–112)
Creatinine, Ser: 1.02 mg/dL (ref 0.4–1.5)
GFR calc Af Amer: >60 mL/min (ref >60)
GFR calc non Af Amer: >60 mL/min (ref >60)
GFR calc non Af Amer: >60 mL/min (ref >60)
Glucose, Bld: 128 mg/dL — ABNORMAL HIGH (ref 70–99)
Glucose, Bld: 151 mg/dL — ABNORMAL HIGH (ref 70–99)
Glucose, Bld: 98 mg/dL (ref 70–99)
Potassium: 3.4 mEq/L — ABNORMAL LOW (ref 3.5–5.1)
Potassium: 3.4 mEq/L — ABNORMAL LOW (ref 3.5–5.1)
Potassium: 4 mEq/L (ref 3.5–5.1)
Potassium: 4.3 mEq/L (ref 3.5–5.1)
Sodium: 138 mEq/L (ref 135–145)
Sodium: 139 mEq/L (ref 135–145)
Total Bilirubin: 1.3 mg/dL — ABNORMAL HIGH (ref 0.3–1.2)
Total Bilirubin: 1.5 mg/dL — ABNORMAL HIGH (ref 0.3–1.2)
Total Protein: 7 g/dL (ref 6.0–8.3)
Total Protein: 7.7 g/dL (ref 6.0–8.3)

## 2010-03-24 LAB — URINE CULTURE: Colony Count: 50000

## 2010-03-24 LAB — PROTIME-INR
INR: 1.19 (ref 0.00–1.49)
INR: 1.33 (ref 0.00–1.49)
INR: 1.34 (ref 0.00–1.49)
INR: 1.36 (ref 0.00–1.49)
INR: 2 — ABNORMAL HIGH (ref 0.00–1.49)
INR: 2.14 — ABNORMAL HIGH (ref 0.00–1.49)
INR: 2.14 — ABNORMAL HIGH (ref 0.00–1.49)
INR: 2.22 — ABNORMAL HIGH (ref 0.00–1.49)
Prothrombin Time: 16.2 seconds — ABNORMAL HIGH (ref 11.6–15.2)
Prothrombin Time: 16.2 seconds — ABNORMAL HIGH (ref 11.6–15.2)
Prothrombin Time: 16.5 seconds — ABNORMAL HIGH (ref 11.6–15.2)
Prothrombin Time: 17 seconds — ABNORMAL HIGH (ref 11.6–15.2)
Prothrombin Time: 22.5 seconds — ABNORMAL HIGH (ref 11.6–15.2)
Prothrombin Time: 23.7 seconds — ABNORMAL HIGH (ref 11.6–15.2)
Prothrombin Time: 24.4 seconds — ABNORMAL HIGH (ref 11.6–15.2)
Prothrombin Time: 25.7 seconds — ABNORMAL HIGH (ref 11.6–15.2)

## 2010-03-24 LAB — BASIC METABOLIC PANEL
BUN: 11 mg/dL (ref 6–23)
BUN: 11 mg/dL (ref 6–23)
BUN: 14 mg/dL (ref 6–23)
BUN: 8 mg/dL (ref 6–23)
CO2: 21 mEq/L (ref 19–32)
CO2: 23 mEq/L (ref 19–32)
Calcium: 8.2 mg/dL — ABNORMAL LOW (ref 8.4–10.5)
Calcium: 8.3 mg/dL — ABNORMAL LOW (ref 8.4–10.5)
Calcium: 8.4 mg/dL (ref 8.4–10.5)
Chloride: 105 mEq/L (ref 96–112)
Chloride: 107 mEq/L (ref 96–112)
Creatinine, Ser: 0.81 mg/dL (ref 0.4–1.5)
Creatinine, Ser: 0.91 mg/dL (ref 0.4–1.5)
Creatinine, Ser: 0.97 mg/dL (ref 0.4–1.5)
GFR calc Af Amer: >60 mL/min (ref >60)
GFR calc Af Amer: >60 mL/min (ref >60)
GFR calc non Af Amer: >60 mL/min (ref >60)
GFR calc non Af Amer: >60 mL/min (ref >60)
GFR calc non Af Amer: >60 mL/min (ref >60)
GFR calc non Af Amer: >60 mL/min (ref >60)
Glucose, Bld: 107 mg/dL — ABNORMAL HIGH (ref 70–99)
Glucose, Bld: 151 mg/dL — ABNORMAL HIGH (ref 70–99)
Potassium: 3.7 mEq/L (ref 3.5–5.1)
Potassium: 3.9 mEq/L (ref 3.5–5.1)
Potassium: 4.3 mEq/L (ref 3.5–5.1)
Sodium: 133 mEq/L — ABNORMAL LOW (ref 135–145)
Sodium: 134 mEq/L — ABNORMAL LOW (ref 135–145)
Sodium: 136 mEq/L (ref 135–145)
Sodium: 137 mEq/L (ref 135–145)
Sodium: 139 mEq/L (ref 135–145)

## 2010-03-24 LAB — BODY FLUID CULTURE: Culture: NO GROWTH

## 2010-03-24 LAB — PHOSPHORUS
Phosphorus: 1.8 mg/dL — ABNORMAL LOW (ref 2.3–4.6)
Phosphorus: 3.2 mg/dL (ref 2.3–4.6)
Phosphorus: 3.4 mg/dL (ref 2.3–4.6)
Phosphorus: 3.6 mg/dL (ref 2.3–4.6)

## 2010-03-24 LAB — URINALYSIS, ROUTINE W REFLEX MICROSCOPIC
Ketones, ur: NEGATIVE mg/dL
Protein, ur: NEGATIVE mg/dL
Urobilinogen, UA: 0.2 mg/dL (ref 0.0–1.0)

## 2010-03-24 LAB — MAGNESIUM
Magnesium: 1.5 mg/dL (ref 1.5–2.5)
Magnesium: 2.2 mg/dL (ref 1.5–2.5)
Magnesium: 2.3 mg/dL (ref 1.5–2.5)
Magnesium: 2.3 mg/dL (ref 1.5–2.5)

## 2010-03-24 LAB — URINE MICROSCOPIC-ADD ON

## 2010-03-24 LAB — HEMOCCULT GUIAC POC 1CARD (OFFICE): Fecal Occult Bld: NEGATIVE

## 2010-03-24 LAB — ABO/RH: ABO/RH(D): O POS

## 2010-03-24 LAB — LIPASE, BLOOD: Lipase: 24 U/L (ref 11–59)

## 2010-04-21 LAB — STONE ANALYSIS

## 2010-04-21 LAB — POCT I-STAT 4, (NA,K, GLUC, HGB,HCT)
Hemoglobin: 17 g/dL (ref 13.0–17.0)
Sodium: 139 mEq/L (ref 135–145)

## 2010-05-20 NOTE — Op Note (Signed)
NAMEIDA, UPPAL                   ACCOUNT NO.:  [id number]   MEDICAL RECORD NO.:  [id number]          PATIENT TYPE:  AMB   LOCATION:  [facility]                         FACILITY:  [facility]   PHYSICIAN:  Excell Seltzer. Annabell Howells, M.D.    DATE OF BIRTH:  03-11-1940   DATE OF PROCEDURE:  01/17/2008  DATE OF DISCHARGE:                               OPERATIVE REPORT   PROCEDURE:  Cystoscopy, left retrograde pyelogram with interpretation,  left flexible ureteroscopy, left ureteroscopy with holmium laser tripsy,  insertion of left double-J stent.   PREOPERATIVE DIAGNOSIS:  An 8 mm left ureteropelvic junction stone.   POSTOPERATIVE DIAGNOSIS:  An 8 mm left ureteropelvic junction stone.   SURGEON:  Dr. Bjorn Pippin.   ANESTHESIA:  General.   SPECIMEN:  Stone fragments.   DRAINS:  6-French 24 cm double-J stent.   COMPLICATIONS:  None.   INDICATIONS:  [name] is a 70 year old white male with an 8 mm left UPJ  stone.  He had previously undergone lithotripsy several years ago and  had complications so he elected ureteroscopic management of the stone.   FINDINGS AND PROCEDURE:  He had been taken off his Coumadin and placed  on Lovenox bridge per Dr. Theresia Lo.  He was given 400 mg Cipro I.V. was  taken operating room where general anesthetic was induced.  He was  placed in lithotomy position.  His perineum and genitalia were prepped  with Betadine solution and he was draped in the usual sterile fashion.   Cystoscopy was performed using a 22-French scope and 12 and 70 degrees  lenses.  Examination revealed a normal urethra.  The external sphincter  was intact.  The prostatic urethra had bilobar hyperplasia with some  obstruction.  The bladder neck was a bit elevated.  Examination of  bladder revealed mild to moderate trabeculation.  No tumors or stones  were noted.  Ureteral orifices were initially difficult to see because  they were somewhat deep by the bladder neck.  However, with the 70  degrees lens, I  was able to identify the orifices.  They were  unremarkable.  Then with a 12 once I knew where they were I was able to  find the orifices.   A 5-French open end catheter was passed into the left ureteral orifice.  It was not easily advanced.  Contrast instilled which revealed some J  hooking of the distal ureter.  I then passed a sensor wire through the  opening catheter and advanced the open-end catheter over the wire.  The  wire was removed and the retrograde pyelogram was completed.  Examination revealed a filling defect in the UPJ but it appeared to  obstruct the lower pole of the kidney more than the upper pole.  There  were stone shadows noted in the lower pole which were known but no other  filling defects were noted.   After completion of retrograde pyelogram which revealed a stone at the  UPJ, he underwent placement of a sensor guidewire to the kidney.  The  cystoscope was removed and the inner core of  a 12/14 54-cm access sheath  was passed to the kidney without difficulty.  The access sheath was then  placed on the dilator and advanced to the kidney without difficulty.  The wire and core were removed and the 6-French digital scope was passed  to the kidney.  The stone was identified at the UPJ.  Interestingly it  appeared to be obstructing the lower calix and not the upper caliceal  system.  It was pushed back into the upper caliceal system where it was  visualized and was felt to be in good position for laser tripsy.   The 200 micron fiber was then passed through the scope and the stone was  engaged and reduced to 1-3 mm fragments.  A few small fragments were  removed using the nitinol basket but once I saw how small they were, I  did not pursue extensive retrieval.   At this point the guidewire was passed through the access sheath to the  kidney.  The access sheath was removed.  The cystoscope was reinserted  over the wire and 6-French 24 cm double-J stent was passed over  the wire  to the kidney under fluoroscopic guidance.  The wire was removed leaving  a good coil in the kidney, a good coil in the bladder.  At this point  the patient's bladder was drained.  A B and O suppository was placed.  He was taken down from lithotomy position.  His anesthetic was reversed.  He was removed to the recovery room in stable condition.  There were no  complications.      Excell Seltzer. Annabell Howells, M.D.  Electronically Signed     JJW/MEDQ  D:  01/17/2008  T:  01/17/2008  Job:  [id number]   cc:   Vikki Ports, M.D.  Fax: [phone]

## 2010-05-23 NOTE — Op Note (Signed)
[facility]  Patient:    [name], [name] Visit Number: [id number] MRN: [id number]          Service Type: NES Location: [facility] Attending Physician:  Evlyn Clines Dictated by:   Excell Seltzer. Annabell Howells, M.D. Proc. Date: 04/28/01 Admit Date:  04/28/2001   CC:         Vikki Ports, M.D.  Genene Churn. Cyndie Chime, M.D.  [facility]   Operative Report  PROCEDURE:  Cystoscopy, left retrograde pyelogram with interpretation, insertion of left double J stent.  PREOPERATIVE DIAGNOSIS:  Left renal pelvic stone.  POSTOPERATIVE DIAGNOSIS:  Left renal pelvic stone.  SURGEON:  Excell Seltzer. Annabell Howells, M.D.  ANESTHESIA:  General.  DRAINS:  6 x 24 left double J stent and Foley catheter.  COMPLICATIONS:  None.  INDICATIONS FOR PROCEDURE:  [name] is a 70 year old white male who had some mild left flank pain, seen by Dr. Theresia Lo who ordered a CT scan which revealed a large stone in the left renal pelvis. He also had some stones on the right. He came to see me in the office. A post CT KUB revealed obstruction of the left kidney. It was felt due to the size of the stone and the fact that the patient was Coumadin dependent would have to be managed carefully with preoperative and postoperative heparin that it would be worthwhile to place the stent prior to lithotripsy of the stone. The options of open surgery, percutaneous treatment, ureteroscopy, and lithotripsy were discussed.  FINDINGS AND PROCEDURE:  The patient was given p.o. Tequin. He had been in the hospital off his Coumadin on heparin protocol. His coags normalized this morning after stopping his heparin at 6. He was taken to the operating room where a general anesthetic was induced. He was placed in lithotomy position. His perineum and genitalia were prepped with Betadine and he was draped in the usual sterile fashion. Cystoscopy was performed using the 22 [place] scope and the 12 and 70 degree lenses.  Examination revealed a normal urethra. The prostatic urethra was short without significant obstruction. Examination of the bladder revealed a smooth wall without tumor, stones or inflammation. The ureteral orifices were in their normal anatomic position effluxing clear urine. The left ureteral orifice was cannulated with a 5 [place] open end catheter contrast instilled a retrograde fashion. There was on abnormality of the ureter distal to the kidney but the stone was impacted quite tightly at the UPJ and the contrast only reluctantly entered the kidney around the stone. I then passed the guidewire through the open end catheter but the standard guidewire would not bypass the kidney. I then used the Glidewire and was able to bypass the stone into the kidney. The open end catheter was advanced over the Glidewire. The Glidewire was replaced with a standard guidewire. The open end catheter was removed and a 6 French 24 cm double J stent was then placed over the wire under fluoroscopic and cystoscopic guidance. Once in good position, the wire was removed leaving a good coil in the kidney above the stone and a good coil in the bladder. The cystoscope was removed, a 16 [place] Foley catheter was inserted, balloon was filled with 10 cc sterile fluid. The catheter was placed to straight drainage, the patients anesthetic was reversed and he was removed to the recovery room in stable condition. There were no complications. He will go for lithotripsy later today. Dictated by:   Excell Seltzer. Annabell Howells, M.D. Attending Physician:  Annabell Howells,  [facility] [employer] DD:  04/28/01 TD:  04/28/01 Job: [id number]

## 2010-05-23 NOTE — Discharge Summary (Signed)
[place]. [facility]  Patient:    [name], [name] Visit Number: [id number] MRN: [id number]          Service Type: MED Location: [phone] Attending Physician:  [name]:  07/30/2000 Discharge Harmon: 08/18/2000   CC:         [name], M.D.  Vikki Ports, M.D.  [name], M.D.  [name], M.D. [facility]   Discharge Summary  Harmon OF BIRTH:  05/04/1940  CONSULTING PHYSICIANS:  [name], M.D. [facility], pulmonary critical care. Orthopedics, [name], M.D.  Neurology, [name], M.D. Hematology, [name], M.D.  PROCEDURE: 1. Thrombolytic treatment upon arrival. 2. Transesophageal echocardiogram. 3. Greenfield filter placement.  DISCHARGE DIAGNOSES: 1. Hypercoagulobility disorder with genetic etiology, protein C an S    deficiency suspected. 2. Massive pulmonary emboli, bilateral lower extremity deep venous thromboses,    ischemic and hemorrhagic cerebrovascular accidents secondary to above. 3. Prostatitis and septicemia, Escherichia coli. 4. Dyslipidemia (low HDL 31, LDL 99, total cholesterol 086). 5. Right carpal tunnel release, July 23, with left to be rescheduled. 6. History of hypertension. 7. History of lumbar spine surgery.  DISCHARGE MEDICATIONS: 1. Zocor 40 mg p.o. q.d. 2. Protonix 40 mg p.o. q.d. 3. Coumadin 4 mg p.o. q.d.  The patient is instructed to discontinue the Prinivil and hydrochlorothiazide, not currently indicated.  FOLLOW-UP:  Appointment has been scheduled with a neural optometrist.  He is to see Dr. Theresia Lo on August 21, for recheck and INR.  He is to reschedule his carpal tunnel surgery.  HISTORY OF PRESENT ILLNESS:  [name] is a 70 year old man with history of controlled hypertension and hyperlipidemia, who was brought to the emergency department the morning of admission after experiencing acute onset of right leg pain followed by  shortness of breath and respiratory distress.  Upon immediate evaluation in the emergency department, [name] was delirious with minimal recollection of recent events.  He did not complain of shortness of breath nor chest pain and primarily was concerned about his right leg discomfort.  Initial evaluation by emergency room physicians included spiral CT which indicated extensive bilateral pulmonary emboli as well as extensive right lower extremity DVT and a more limited left popliteal DVT.  I was asked to admit the patient for further therapy.  For further details of admission history and physical, please see my dictated report.  HOSPITAL COURSE:  #1 - Hypercoagulobility disorder with massive PE, bilateral DVT, ischemic and hemorrhagic CVAs.  On arrival, vital signs included blood pressure 103/73, pulse 120, respirations 30, oxygen saturation 80% on room air.  He appeared initially shocky and in respiratory distress improving after oxygen was administered.  The lungs were clear to auscultation.  Right leg was notably larger than the left with palpable superficial veins in the medial thigh extending into the calf.  The extremities were warm and well perfused.  Given the extent of the clot load and his critical condition, [name], M.D. [facility] provided immediate consultative assistance and directed the use of TPA thrombolysis.  Chart review and limited patient history revealed no contraindications to this plan.  He was admitted to intensive care unit.  By the following day, [name] was experiencing no chest pain or shortness of breath and mental status had improved considerably.  There was improvement in the right leg symptoms.  Oxygen saturation was improved and vital signs were stable.  Examination, however, revealed a  right homonymous hemianopsia, at which time heparin was immediately stopped and the patient was sent for a noncontrasted CT to rule out intracranial hemorrhage.  A  noncontrasted study done the day prior revealed an asymptomatic old left cerebellar CVA, and follow-up study showed more extensive low attenuation throughout the left posterior parietal occipital lobes consistent with edema and infarction. There was also a new small focus of hemorrhage within the left posterior cerebellum, the result of which was conveyed to me immediately by [name]. [name], M.D. [name], M.D. was consulted, and Greenfield filter was arranged and placed.  Heparin remained on hold and no other anticoagulants or antiplatelet agents were administered.  Transesophageal echocardiogram ruled out presence of right to left shunt.  MRI/MRA of the brain further clarified the lesions, which revealed confirmation of the prior documented strokes with the addition of a new right occipital lobe infarction.  There was no change in the degree of hemorrhage in the left cerebellar infarct.  There was some concern about irregularity and stenosis of the left posterior cerebral artery branches distal to the P2 segment.  The combination of events presented a therapeutic dilemma.  With a massive pulmonary emboli and peripheral clot burden, heparin was felt to be strongly indicated, however, not in a setting of a hemorrhagic CVA.  [name], M.D. and other consultants conferred in a multidisciplinary approach with pulmonary, neurology, and internal medicine physicians.  The case was presented by phone to Darron Doom, M.D. attending at [facility] hematology who felt that some form of anticoagulation other than aspirin would be indicated despite the vena cava filter.  Dextran was suggested and discussed.  Before it could be initiated, however, [name] experienced an acute episode of shortness of breath and hypoxia accompanied by  tachycardia and shaking chills with delirium.  It was felt that he was extending his pulmonary clots and much deliberation, we  restarted heparin on July 31.  Ultimately it was felt that [name] episode was due to sepsis from a urologic source and not due to clot extension, and fortunately, he experienced no complications from the resumption of heparin.   Brain CT scans were followed on an intrical basis as well as careful clinical examinations and improvement was slow and steady.  Etiology of [name] stroke appears to be genetic, as his father suffered a PE as well.  Hypercoagulobility panel was submitted to [facility].  Negative studies included prothrombin gene mutation, factor V leiden, lupus anticoagulant, and antiphosphylipid antibodies.  Homosystein was slightly elevated which was felt to be insignificant and protein C and S were mildly depressed which is also of questionable significance in the acute setting.  After heparin had been restarted for 10 days and head CT remained stable with no further hemorrhage, low dose Coumadin was carefully initiated.  Goal INR will be 2.3 to 3.3 with management per Dr. Clarnce Flock office with the assistance of Dr. Cyndie Harmon as needed.  At this time, [name] has no pulmonary symptoms and has been able to ambulate about the halls.  His right lower extremity edema has nearly completely resolved and is no longer painful.  The only residual from his strokes is a right homonymous hemianopsia which has been quite debilitating and for which he has been referred to a neural optometrist at the suggestion of neurology team.  [name] was discharged in stable condition.  #2 - Prostatitis versus pyelonephritis with septicemia, Escherichia coli.  As above,  [name] experienced an acute episode of shaking rigors, delirium, fever, leukocytosis, and hypoxia.  Urine and blood cultures revealed Escherichia coli which was treated with broad spectum antibiotics until culture data were finalized.  He recovered quickly from this illness with no untoward  events. Attending Physician:  [name] DD:  10/07/00 TD:  10/08/00 Job: [id number]

## 2010-05-23 NOTE — Discharge Summary (Signed)
NAME:  [name], [name]                             ACCOUNT NO.:  [id number]   MEDICAL RECORD NO.:  [id number]                   PATIENT TYPE:  INP   LOCATION:  5020                                 FACILITY:  [facility]   PHYSICIAN:  Deirdre Peer. Polite, M.D.              DATE OF BIRTH:  11-Dec-1940   DATE OF ADMISSION:  09/25/2001  DATE OF DISCHARGE:  10/01/2001                                 DISCHARGE SUMMARY   DISCHARGE DIAGNOSES:  1. Anticoagulation prior to  surgery.  2. Left carpal tunnel release.  3. History of deep venous thrombosis and pulmonary embolism,     hypercoagulopathy disorder.  4. History of nephrolithiasis x 3 with perinephric hematoma.  5. History of hypertension.  6. History of dyslipidemia.  7. History of cerebrovascular accident.   DISCHARGE MEDICATIONS:  1. Hydrochlorothiazide 25 mg q.d.  2. Prinivil 20 mg q.d.  3. Zocor 10 mg q.d.  4. Coumadin 4 mg q.d.  5. Ultracet 1 to 2 tablets q.6h.  p.r.n.  6. Percocet 5/325 1 to 2 tablets p.o. q.4h. p.r.n. pain. Do not use in     addition to Ultracet.   CONSULTS:  Dr. Doristine Section, orthopedic surgery.   PROCEDURES AND STUDIES:  1. A chest x-ray on September 26, 2001, nodular shadows overlying the lung     basis, most likely representing nipple shadows.  No active disease.  2. An EKG September 26, 2001, normal sinus rhythm, ventricular rate 65.   LABORATORY DATA:  Discharge INR 2, PT 20.5.  Discharge CBC with a WBC count  of 6.5, hemoglobin 4.94, hemoglobin 15.1, hematocrit 43.7.   DISPOSITION:  The patient will be discharged home.   HISTORY OF PRESENT ILLNESS:  This is a 70 year old man with a history of DVT  and pulmonary embolism due to hypercoagulopathy, who was on chronic  Coumadin therapy at home. The patient was scheduled for a left carpal tunnel  release and was admitted for anticoagulation before and after his surgery.   HOSPITAL COURSE:  PROBLEM #1, CHRONIC ANTICOAGULATION DUE TO  HYPERCOAGULOPATHY, DEEP  VENOUS THROMBOSIS AND SUBSEQUENT PULMONARY EMBOLUS:  Preadmission the patient stopped taking his Coumadin on September 22, 2001.  On admission on September 25, 2001, he was  started on IV heparin which was  stopped prior to his surgery. On September 27, 2001, he underwent a left  carpal tunnel release. Post surgery he was started back on IV heparin along  with his Coumadin with better PT and INRs.  Prior to admission he was on 4  mg of Coumadin. At discharge he was  on 4 mg of Coumadin with a discharge PT  of 20.5 and INR of 2.0. The patient will have a repeat PT/INR in Dr.  Aurelio Brash office on Monday or Tuesday of next week.  He will make a  followup appointment with Dr. Theresia Lo in one week.   PROBLEM #  2, CARPAL TUNNEL RELEASE: The patient underwent surgery with no  complications. At discharge orthopedics recommendations were for followup in  the orthopedics office in two weeks with removal of stitches. The patient is  able to move his fingers. Capillary refill was good.   PROBLEM #3,  HYPERTENSION:  The patient was  continued on  his home  medications of hydrochlorothiazide and Prinivil. His blood pressure remained  controlled throughout his hospitalization.   PROBLEM #4, DYSLIPIDEMIA:  The patient was maintained on  his Zocor during  his hospitalization.   DISCHARGE INSTRUCTIONS:  The patient will have a PT/INR drawn either on  October 03, 2001,  or October 04, 2001, at Dr. Aurelio Brash office with a  followup appointment in one week. He will followup with Dr. Lowell Guitar in  approximately one and a half weeks for removal of his stitches.  The patient  has been instructed not to use Percocet in addition to Ultracet due to  Tylenol in both medications.      Stephanie Swaziland, N.P.                    Deirdre Peer. Polite, M.D.    SJ/MEDQ  D:  10/01/2001  T:  10/04/2001  Job:  [id number]   cc:   Vikki Ports, M.D.   Deidre Ala, M.D.  [address]  [place], [place]  [address]  Fax: [phone]

## 2010-05-23 NOTE — Consult Note (Signed)
[employer] [facility]. [facility]  Patient:    [name], [name]                          MRN: [id number] Proc. Date: 08/03/00 Adm. Date:  [id number] Attending:  Miguel Aschoff CC:         Viviana Simpler, M.D.  Candy Sledge, M.D.  Barbaraann Share, M.D. [facility]  Vikki Ports, M.D., [facility]   Consultation Report  BRIEF HISTORY:  This is a hematology/oncology consultation to evaluate this patient for a coagulopathy.  Mr. Lottman is a 70 year old man in overall good health.  He has hypertension and hyperlipidemia controlled on medication.  He developed acute onset of right lower extremity calf swelling and pain.  Later the same day he developed acute dyspnea and called the ambulance.  He was brought to the [facility] Emergency Department.  He was confused and hypoxic.  Initially a CT scan of the brain was done which showed evidence of previous ischemic injury with small lacunar infarcts in the area of the basal ganglia bilaterally and encephalomalacia of the left cerebellum.  A CT scan of the chest was done and showed extensive bilateral pulmonary emboli involving all lobes of the lungs.  In addition there was a saddle embolus within the main pulmonary arteries.  Extensive deep venous thromboses were all seen involving the right popliteal, superficial femoral and common femoral veins.  There was also a thrombus in the left popliteal and superficial veins.  He was admitted to the intensive care unit.  He was started on anticoagulation with heparin and given oxygen.  Pulmonary consultation was obtained.  In view of his young age and the extent of the clots and in the absence of any contraindications, a course of thrombolytic treatment was recommended.  He received a standard dose of t-PA.  On the second hospital day he complained of acute change in vision and visual field defects were noted.  Urgent CT scan of the brain was repeated and  showed an extensive area of infarction in the left parieto-occipital region.  In addition there was a small area of hemorrhage in the left cerebellum.  In view of the hemorrhagic component of the acute stroke, a inferior vena caval filter was placed via a right internal jugular vein approach.  Neurology consultant recommended discontinuing the heparin at that point and starting the patient on aspirin.  MRI of the brain confirmed the CT findings and showed a small less than 1 cm intracranial hemorrhage in the left cerebellum, extensive nonhemorrhagic infarct in the left temporo-occipital lobe and a small acute infarction in the right occipital lobe.  The MR angiogram showed that the major vessels including vertebrobasilar and internal carotid arteries were patent.  A laboratory evaluation revealed on admission a normal CBC except for a mild reactive leukocytosis with a normal white count differential.  Hemoglobin 14.9, hematocrit 42.6, MCV 89, white count 12,400, with 65% neutrophil, 25% lymphocytes, and a platelet count of 216,000.  Baseline coagulation studies were normal with a protime of 13.3, and a PTT of 24.0.  Additional special hematology studies that have returned so far include a protein C level of 74% (normal 91-147), a homocysteine level of 14.86 (4.45-12.42), and a antithrombin III level 101% (75-120).  A transesophageal echocardiogram showed no embolic source for the stroke. There were no ventricular or atrioseptal defects.  Of note, is the fact that the patients father had  a pulmonary embolus when he was 69 years old.  He survived this and did not die until he was 42.  There was no other personal or family history of any bleeding or clotting problems.  He has had a number of surgeries in the past without any difficulty.  In January he had a lumbar laminectomy for spinal stenosis.  In May he had a left carpal tunnel procedure.  He does not smoke or use alcohol.  He has  no signs or symptoms of a collagen vascular disorder.  He has no liver disease.  MEDICATIONS:  Medications on admission included: 1. Prinivil 20 mg daily. 2. Zocor 40 mg daily. 3. Hydrochlorothiazide 25 mg daily.  ALLERGIES:  No known allergies.  FAMILY HISTORY:  As noted above.  Mother is still alive and healthy at age 89. He has a brother age 18 with side effects from previous agent orange exposure. Father died at age 37 and had a PE at age 18.  SOCIAL HISTORY:  He is divorced.  He has a daughter age 19, recently diagnosed with breast cancer and elected to have bilateral mastectomies.  He has a son in his 30s who is healthy.  He works as a Designer, television/film set at [employer]. No tobacco, occasional alcohol.  PHYSICAL EXAMINATION:  GENERAL:  He is alert and oriented.  He has a visual field cut on the right side.  VITAL SIGNS:  Current blood pressure 120/64, pulse 100 and regular, respirations 20, temperature 100.2.  SKIN, HAIR AND NAILS:  Normal.  No ecchymosis, petechiae or rash.  HEENT:  Pupils equal, round, and reactive to light.  Pharynx:  No erythema, exudate or mass.  NECK:  Supple.  No thyromegaly.  CAROTIDS:  2+, no bruits.  LUNGS:  Overall clear with some decreased breath sounds at the bases.  Regular cardiac rhythm without murmur, gallop, or rub.  No jugular venous distension.  ABDOMEN:  Soft, nontender, no mass, no organomegaly.  There is no lymphadenopathy.  No testicular masses.  EXTREMITIES:  No edema.  There is calf swelling and tenderness on the right. The arterial pulses are all 2+ and symmetric, dorsalis pedis and radials.  NEUROLOGIC:  I did not check visual fields.  Mental status intact.  Cranial nerves grossly intact.  Vision and visual fields not tested.  Motor strength  is 5/5, reflexes 2+ symmetric.  Upper body coordination is normal.  Gait not tested.  ADDITIONAL DATA:  A chest radiograph showed no abnormalities.  INITIAL CHEMISTRY PROFILE:   Not recorded.  Urine did positive for blood. Positive for opiates and barbituates.  SUMMARY AND IMPRESSION:  A 70 year old man presents with acute massive pulmonary embolus and associated right lower extremity DVT.  While on anticoagulation he developed a neurologic change and is found to have both thrombotic and hemorrhagic stroke.  Pertinent labs so far show a mild decrease in protein C and a mild increase in plasma homocysteine.  No embolic source for the stroke has been determined.  I believe the clinical findings are certainly compatible with an underlying coagulopathy.  The simultaneous occurrence of thrombosis in both arterial and venous circulations is very rare.  Although this might well be related to a congenital protein S deficiency, especially in view of the family history, protein S and protein C values must be interpreted with caution in the setting of the acute clot and will need to be repeated at an interval.  The borderline elevation of his homocysteine is of questionable significance  but may also be a predisposing risk factor for stroke in young people.  In view of thrombosis in the arterial circulation we should also check for antiphospholipid antibodies.  In addition I will also check for the prothrombin gene mutation, factor VIII level, and the presence of a lupus anticoagulant.  I think a major issue at this time is whether or not he should be put back on heparin anticoagulation.  This really depends on whether or not we feel he does have an underlying coagulopathy.  While the caval filter will protect his lungs, if he has an underlying coagulopathy the rest of his body remains at risk.  I did discuss the situation with the neurologist on call this evening, Dr. Sharene Skeans.  Although he does not have the advantage of having looked at the scans, hearing the patients history and given results of a recent large randomized controlled trial, looking at aspirin versus  Coumadin anticoagulation for stroke, he would favor continuing the aspirin at this time and not putting the patient back on heparin.  I will defer to his expertise in this regard pending results of further hematology tests which are outstanding.  Thank you for this consultation, I will follow the patient with you with interest. NEUROLOGIC DD:  08/03/00 TD:  08/04/00 Job: [id number]

## 2010-05-23 NOTE — Discharge Summary (Signed)
[place]  Harmon:    [name], [name] Visit Number: [id number] MRN: [id number]          Service Type: MED Location: 3W [id number] Attending Physician:  Miguel Aschoff Dictated by:   Haydan Plum, M.D. Admit Date:  04/26/2001 Discharge Date: 05/06/2001   CC:         Vikki Ports, M.D., [facility]  Excell Seltzer. Annabell Howells, M.D.  Genene Churn. Cyndie Chime, M.D.   Discharge Summary  DISCHARGE DIAGNOSES:  1. Left perinephric hematoma--stable.  2. Nephrolithiasis with left ureteral stone, status post lithotripsy.  3. Normocytic anemia, asymptomatic, secondary to problem #1.  4. Hepatic mass, likely hemangioma.  MRI done on May 06, 2001, will need     repeat study in three months for followup.  5. Hypercoagulable disorder--suspected protein C and S deficiency.     a. Negative for protein gene factor V.  Lupus anticoagulant and        antiphospholipid studies, protein C and S to be drawn on followup as an        outpatient on Monday, May 09, 2001 by Dr. Theresia Lo.     b. Massive pleural effusion with bilateral lower extremity deep venous        thrombosis emboli 2002, status post thrombolysis treatment.     c. Status post Greenfield filter in July 2002.     d. Likely increased homocystine levels.     e. On Coumadin treatment, for long-term, goal INR 2.0-2.5.  6. Dyslipidemia.  7. Hypertension.  8. Central canal decompression and foraminostomies involving L3-S1.  9. Ischemic and hemorrhagic cerebrovascular accidents July 2002. 10. Status post right carpal tunnel release in July 2002. 11. Short-term memory difficulty. 12. Status post appendectomy. 13. History of nephrolithiasis x3.  DISCHARGE MEDICATIONS:  1. Zocor 10 mg p.o. q.h.s.  2. ______ 325 mg p.o. t.i.d.  3. Pepcid 20 mg p.o. b.i.d.  4. Augmentin 875 mg p.o. b.i.d. for three days only.  5. Pyridium 200 mg p.o. t.i.d. p.r.n.  6. Maalox 30 cc p.o. t.i.d. p.r.n.  7. Tylox 1-2 tablets  p.o. q.4h. p.r.n.  8. [name] is also on lisinopril 20 mg p.o. q.d. and hydrochlorothiazide     25 mg p.o. q.d. prior to admission.  He has enough supply of these     medications at home and will be taking these medications at home on     discharge.  DISCHARGE LABORATORY DATA:  Hemoglobin 9.9, hematocrit 27.6, WBC 6.1, MCV 19.6, platelet count 217.  Pro time 20.6, INR 2.0, PTT 36.  Sodium 135, potassium 4.3, chloride 103, bilirubin 1.0, alkaline phosphatase 44, SGOT 25, SGPT 29, total protein 5.9, albumin 2.9.  ACTIVITY:  As tolerated.  DIET:  Low-salt diet.  SPECIAL INSTRUCTIONS:  [name] is to report to his M.D. if he has present any episode of dizziness or any flank pain.  He has been counseled to get his children to be tested for hypercoagulable disorder by their own doctors at their own convenience.  I have mentioned to [name] that Dr. Theresia Lo will start his Coumadin on his evaluation on Monday, May 09, 2001.  FOLLOWUP:  Followup appointment will be with Dr. Theresia Lo on Monday, May 09, 2001, at 9 a.m.  He will also follow up with Dr. Annabell Howells on Friday, May 13, 2001 at 10 a.m.  HISTORY OF PRESENT ILLNESS:  [name] was admitted electively on April 26, 2001 by Dr. Jetty Duhamel for management of  his left-sided kidney stone with lithotripsy.  He was admitted to Dustin medical floor, where upon he was prepared for surgery with withholding his Coumadin and being started on heparin.  [name] successfully underwent Dustin procedure, lithotripsy by Dr. Annabell Howells, but post surgery sustained a perinephric hematoma.  HOSPITAL COURSE: #1 - LEFT PERINEPHRIC HEMATOMA:  As mentioned above, [name] successfully underwent lithotripsy by Dr. Annabell Howells upon admission, but during observation post surgery [name] showed a drifting of his hemoglobin with some mild hypotension and followup evaluation with imaging studies revealed a left perinephric hematoma.  At this point, his heparin  was withheld and Dr. Cyndie Chime, who knows [name] very well as an outpatient with respect to his hypercoagulable disorder, was consulted, who agreed to plan of management.  Advised that [name] be discharged home and have a followup repeat of protein C and protein S and then reinstitute his Coumadin as an outpatient with followup of his INR with target of 2.0-2.5.  I have discussed Dustin patients hospital course and plan of care with Dustin patients primary care physician, Dr. Theresia Lo, this morning by telephone.  I have mentioned to him that [name] should be restarted on his Coumadin on his evaluation on Monday, May 09, 2001, with repeat of blood draws for protein C and protein S. I advised him that he should monitor carefully Dustin patients hemoglobin and his vital signs, especially his BP and pulse, to document instability with respect to his perinephric hemorrhage.  [name] has also been advised that he should report to his M.D. or Dustin emergency room should he experience any episode of dizziness.  At time of discharge, [name] was clinically stable.  His vital signs have been stable for Dustin last 72 hours or so.  His hemoglobin had been stable without any evidence of any drifting down, and he is being discharged home for further management, as mentioned above.  #2 - NEPHROLITHIASIS, LEFT URETERAL STONE, STATUS POST LITHOTRIPSY:  As indicated above, [name] successfully had GU manipulation on admission. It was performed by Dr. Annabell Howells of urology.  Dr. Annabell Howells has been involved in Dustin patients discharge planning and is aware of Dustin patients discharge home.  He will still see [name] on followup on Friday, May 13, 2001, for KUB.  #3 - NORMOCYTIC ANEMIA:  Dustin patients hemoglobin dropped during hospitalization on account of problem #1.  By Dustin time of discharge, Dustin  Harmon was asymptomatic, with respect to his anemia.  He had been started on iron and he would need  ongoing intermittent checks of his hemoglobin and continue with his iron therapy for at least 2-3 months.  #4 - HYPERCOAGULABLE DISORDER:  As mentioned above, [name] has history of hypercoagulable disorder.  This is not new, for which he was on chronic Coumadin long-term.  Our target INR should be 2.0-2.5.  [name] is going to be reinitiated on his Coumadin on Monday, May 09, 2001, by primary care physician, Dr. Theresia Lo, and I have already discussed long-term plan of care with Dr. Theresia Lo.  I discussed Dustin patients plan of care with Dr. Cyndie Chime, who thought ______ that it is not necessary for [name] to follow up with him now, unless he has any complications, and Dr. Theresia Lo will oversee his anticoagulation management with Dustin above-target INR.  #5 - LIVER MASS:  During Dustin evaluation in hospital, imaging studies indicated a liver mass, which was subsequently reevaluated for clarity with MRI today and discussions  with radiology is suspicious for hemangioma.  Primary care physician is to follow up on Dustin results of this study and will need a repeat followup study in about three months.  DISPOSITION:  [name] is going home with his Biomedical engineer.  CONSULTANTS: 1. Dr. Cyndie Chime. 2. Dr. Annabell Howells.  CONDITION ON DISCHARGE:  Improved.  I spent more than 45 minutes today in preparing and coordinating Dustin patients discharge today. Dictated by:   Ohn Plum, M.D. Attending Physician:  Miguel Aschoff DD:  05/06/01 TD:  05/09/01 Job: [id number]

## 2010-05-23 NOTE — Discharge Summary (Signed)
[place]  Patient:    [name], [name]                          MRN: [id number] Adm. Date:  [id number] Disc. Date: [id number] Attending:  Drema Pry Dictator:   Vilinda Blanks. Moye, P.A.-C. CC:         Vikki Ports, M.D.   Discharge Summary  DIAGNOSIS:  Central canal stenosis, L3-4, L4-5, and L5-S1 with low back and right leg pain.  PROCEDURES:  Central canal decompression of L3-4, L4-5, and L5-S1 with foraminotomies and bilateral lateral posterior fusion using bone graft only from local autograft, cancellous allograft, and Grafton putty by Jearld Adjutant, M.D., on January 13, 2000.  MEDICATIONS AT DISCHARGE:  Tylox as needed for pain.  DISPOSITION:  The patient was discharged home in chair back brace with wound care instructions.  He was to follow up in the office with Jearld Adjutant, M.D., in 10 days post discharge.  HISTORY OF PRESENT ILLNESS:  This is a 70 year old male with a history of low back and right leg pain which was recurrent after nerve root blocks.  His pain was mostly in the L5 distribution.  Myelogram CT showed significant narrowing of L4-5 with bulging disk on the right.  HOSPITAL COURSE:  He was admitted and taken to the operating room on January 13, 2000, and underwent central canal decompression by Jearld Adjutant, M.D., as described above.  He tolerated the procedure well.  Postoperatively he had much less leg pain.  He did experience some bladder spasms postoperatively which were helped with Ditropan.  He advanced well with physical therapy and was discharged home on postoperative day #5 with medications and instructions as above. DD:  02/07/00 TD:  02/09/00 Job: [id number]

## 2010-05-23 NOTE — Op Note (Signed)
NAME:  [name], [name]                             ACCOUNT NO.:  [id number]   MEDICAL RECORD NO.:  [id number]                   PATIENT TYPE:  INP   LOCATION:  5020                                 FACILITY:  [facility]   PHYSICIAN:  Deidre Ala, M.D.                 DATE OF BIRTH:  1940/11/30   DATE OF PROCEDURE:  DATE OF DISCHARGE:                                 OPERATIVE REPORT   PREOPERATIVE DIAGNOSES:  Left carpal tunnel syndrome, severe.   POSTOPERATIVE DIAGNOSES:  Left carpal tunnel syndrome, severe.   PROCEDURE:  Left carpal tunnel release.   SURGEON:  Bradley Ferris, M.D.   SURGEON:  Sueanne Margarita, P.A.-C.   ANESTHESIA:  General endotracheal.   CULTURES:  None.   DRAINS:  None.   ESTIMATED BLOOD LOSS:  Minimal.   TOURNIQUET TIME:  20 minutes.   PATHOLOGIC FINDINGS AND HISTORY:  This patient has had bilateral carpal  tunnel syndrome in the past.  On July 20, 2000, he was taken to the  operating room and had a carpal tunnel release.  This followed some previous  spinal stenosis surgery which we had done.  He did well with the carpal  tunnel and healed well, but on August 02, 2000, his surgery having been on  July 20, 2000, he developed a massive pulmonary embolus, not felt to be  related to his surgeries, back or leg.  In any case, he has, therefore, not  done anything about the left carpal tunnel.  Since he has had this problem,  he had a Greenfield filter and is on Coumadin, but ultimately, the problem  worsened, and he desired release, and so he came back to me.  We talked to  Dr. Theresia Lo and Dr. Julio Sicks who followed him, and they felt it was safe  to proceed with heparinization preoperatively.  It should be noted that  prior to his right carpal tunnel, nerve conduction tests showed severe  bilateral median nerve entrapment neuropathy with evidence of sensory axonal  loss.  In surgery today, he had classic hourglass constrictioning with  redness on either end of the  constriction and actually some whiteness to the  nerve at the middle, indicative of ischemia.  This was right under the  transverse carpal ligament.  This was released completely, as well as  epineurectomy over the volar surface, and then we released well up into the  forearm as well as took some of the transverse carpal ligament out.  He was  on heparin preoperatively, and we will put him on heparin postoperatively  and on Coumadin, and he will be discharged per Dr. Theresia Lo and Dr. Cloyd Stagers-  Bonsu.   PROCEDURE:  With adequate anesthesia obtained using endotracheal technique,  1 gm of Ancef given IV prophylactically, the patient was placed in a supine  position.  The right upper extremity was prepped from the  fingertips to the  tourniquet in a standard fashion.  After standard prepping and draping,  Esmarch exsanguination was used.  The tourniquet was inflated up to 250  mmHg.  We then made a longitudinal palmar incision at the base of the palm  in the skin lines.  The incision was deepened sharply with a knife, and  hemostasis was obtained using a Associate Professor. The incision was  deepened sharply further down to the palmar fascia, which was then incised  with protection of a freer elevator over the nerve using a 64 Beaver blade.  I then released the distal wrist retinaculum over the nerve on the ulnar  side up the forearm underneath the skin.  I then distally placed all  branches, including the motor branch and then released the volar epineurium  over the constriction.  Irrigation was carried out.  There were no other  mass lesions within the canal, and the wound was closed with a running 4-0  nylon.  A bulky sterile compressive dressing was applied with a volar  plaster splint, and the patient having tolerated the procedure well was  awakened and taken to the recovery room in satisfactory condition to be  admitted back to the floor for routine postoperative care, given his  special  anticoagulation situation.                                               Deidre Ala, M.D.    VEP/MEDQ  D:  09/27/2001  T:  09/28/2001  Job:  [id number]   cc:   Jarriel Plum, M.D.  [address]  [place]  [place] [address]  Fax: [phone]   Vikki Ports, M.D.   Darden Palmer., M.D.  [address]  [place]  [place] [address]  Fax: [phone]

## 2010-05-23 NOTE — Op Note (Signed)
[place]  Patient:    [name], [name]                            MRN: [id number] Proc. Date: 01/13/00 Attending:  Jearld Adjutant, M.D. CC:         [name], M.D.  [name], M.D.   Operative Report  PREOPERATIVE DIAGNOSIS:  Central canal stenosis, lumbar 3-4, 4-5, and 5-1.  POSTOPERATIVE DIAGNOSIS:  Central canal stenosis, lumbar 3-4, 4-5, and 5-1.  PROCEDURES: 1. Central canal decompression and foraminotomies, lumbar 3-4, 4-5, and 5-1    with: 2. Bilateral lateral posterior fusion using bone graft only from local    autograft, cancellous allograft, and graft-on putty.  SURGEON:  Jearld Adjutant, M.D.  ASSISTANTClide Cliff D. Gasper Harmon, M.D. and [name], P.A.C.  ANESTHESIA:  General endotracheal.  CULTURES:  None.  DRAINS:  None.  ESTIMATED BLOOD LOSS:  700 cc.  PATHOLOGIC FINDINGS AND HISTORY:  [name] is a 70 year old male who first presented to me a year and a month ago with low back and right leg pain.  He was sent for consultation by Dr. Theresia Lo.  He did have some degenerative narrowing in the lumbar spine, a spondyloretrolisthesis, and perhaps a little bit of forward position of 4 on 5.  I felt he had a degenerative disk segment at 4-5, 5-1 with a bulging disk and lateral recessed stenosis.  We gave him a cortisone dose pack, Neurontin, and got an MRI scan.  It showed moderate central canal stenosis at 3-4 and 4-5 with a subarticular recessed stenosis at 4-5, largely due to congenital factors with short pedicles and annular disk bulging.  At this point, we sent him for a V-Lok support, and he was improved. We held on the epidurals.  By February 05, 1999, he was still having radiation into his legs.  We sent him for 3 lumbar epidurals.  By May 05, 1999, he had gotten good relief with the 3 epidural steroids, but the pain came back to level 10 on a scale of 0 to 10.  The pain is back, radiating down his right leg.   He had taken Vioxx.  At this point, we got further workup and did a myelogram and post myelogram CT with worsening of pain in the right L5 distribution to his calf.  A CT scan showed mild spinal stenosis at 3-4, moderate at 4-5 with an asymmetric disk bulge to the right.  His problem was that he really could not work because he hurt too bad if he tries to work.  We sent him for a right L5 nerve root block.  He came back June 16, 1999, no pain going down his leg, very pleased, 3 right L5 nerve root blocks.  He was continued on Neurontin.  He came back on July 14, 1999, feeling better, taking Neurontin and Vioxx.  He came back in on August 18, 1999, still doing well but now is getting left-sided pain in the 5 distribution, and he did give him one more L5 nerve root injection that helped for a while, but the pain has recurred.  Now, the pain in both 5 distributions.  Now mostly painful on the left side.  I felt in reviewing his studies that he had a clear central canal stenosis at 4-5 and 5-1, but I also was concerned about 5-1, and I felt it was very narrow on the  cross-sectional views.  Lateral flexion extension bending films showed 4-5 was not loose, but it seemed a little bit slipped on one view but no motion between flexion and extension.  I did not think it was significantly unstable; however, at this point, I felt we should do a 3-4, 4-5, 5-1 central canal decompression.  Initially, I felt he probably could get balance out of fusion; however, I reconsidered it and felt that since he was relatively young and active, while I would not do an instrumented fusion, I would at least take the cartilage out of the facets at 3-4, 4-5, and 5-1, clean the lateral gutters down to the ala in all the transverse processes and pack autograft mixed with allograft and graft-on putty in the gutters to stabilize him somewhat since he was having this central canal decompression. At surgery, he was not overtly  loose after laminectomy, but I still feel that in his age group, he should have some sort of stabilization.  I did feel that 4-5 was a little slipped forward even though it did not move on flexion extension.  Therefore, this is what was done as above.  We had a small 5-mm partial thickness tear of the dura that did not effectively leak at lumbar 4-5, but we went ahead and sutured it with 6-0 Ethilon over Surgicel with a patch down of Surgical over top and then sealed it further with Tissel and made sure that there was no leak at closure.  We decompressed nicely; 4-5 was obviously the tightest spot.  We decompressed the lateral recesses and the foramina on both sides and probed them with a hockey stick to make sure they were free, especially the left and right L4, L5, and the 5 nerve root.  DESCRIPTION OF PROCEDURE:  After anesthesia was obtained using endotracheal technique, 1 g Ancef was given IV prophylaxis and another one later in the case.  The patient was placed prone in a flexed position.  The lumbar spine was then prepped and draped in a standard fashion.  A localizing needle was placed with scout film, pointing [place] to the body and spinous process of L5. After standard prepping and draping, an incision was deep and sharp with a knife, and hemostasis was obtained using the Bovie electrocoagulator. Dissection was carried down through the subcutaneous tissue and lumbar fascia. We then dissected paraspinus musculature and soft tissues off the lamina and ala from L3 to the sacrum.  We then meticulously cleaned both gutters, putting a large McEvoy-type retractor in place and cleaned out the lateral gutters, the transverse processes, the acrae, the alae, and then resected cartilage out of the facets.  We then packed these gutters for hemostasis.  We then removed the spinous processes, inferior half of 3, 4-5, and superior 1.  These were then cleaned of soft tissue and morcelized as bone  graft.  We then completed  central canal decompressive laminectomies using Kerrison punch and Leksell as above for 3-4, 4-5, and 5-1.  The dural tear was repaired, and then we did foraminotomies at each level, checked each nerve root with a hockey stick to make sure they were clear and free.  I did not explore the bulging disk, which I felt was not a herniation as I did not want to remove disks to further destabilize the segment.  We attained a meticulous hemostasis.  We then placed Surgicel over the dura with Tissel to seal on top.  We checked under Valsalva 40 mmHg for a  CSF leak.  There was none.  We then roughened up the lateral recesses and placed the graft-on putty, allograft, and autograft and blood in the lateral gutters and facet joints.  We then carefully removed the McEvoy retractor, again irrigated and closed the wound in layers in the dead space of the lumbar fascia over the central laminectomy site with #1 Vicryl, the lumbar fascia with #1 Vicryl, 2-0 Vicryl in the subcu, and skin staples. Bulky sterile compressive dressing was applied, and the patient, having tolerated the procedure well, was awakened and taken to the recovery room in satisfactory condition for routine postoperative care, chair-back brace, and analgesia. DD:  01/13/00 TD:  01/13/00 Job: [id number]

## 2010-05-23 NOTE — H&P (Signed)
[facility]  Patient:    [name], [name]                          MRN: [id number] Adm. Date:  [id number] Attending:  Miguel Aschoff CC:         Vikki Ports, M.D.   History and Physical  DATE OF BIRTH:  [birth year]  CHIEF COMPLAINT:  Asked to evaluate for pulmonary embolus.  HISTORY OF PRESENT ILLNESS:  Mr. Sar is a 70 year old man with controlled hypertension and hyperlipidemia, who was brought to the emergency room this morning by EMS after being summoned for acute onset of right leg pain followed by shortness of breath and respiratory distress.  Mr. Jelinski states currently (after having been in the emergency room for nearly two hours) that he is no longer short of breath.  He really has no recollection of events, and is notably confused.  He denies chest pain, although he states that his right leg is uncomfortable.  He cannot recall prior suggestion/symptoms of oncoming event.  Old records indicate that he underwent a three-level lumbar spinal stenosis decompression surgery in January 2002 by Dr. Jearld Adjutant, although the postoperative course details are unavailable currently.  On initial evaluation prior to my involvement, he was noted to have swollen leg and hypoxia, for which spiral CTs were obtained indicating extensive bilateral pulmonary emboli as well as extensive right lower extremity DVT and more limited left popliteal DVT.  I was asked to admit the patient for further therapy.  PAST MEDICAL HISTORY:  Recent palm surgery, etiology/indication unknown, as the patient does not recall having this done (sutures noted in palm).  History of hypertension, controlled.  History of hyperlipidemia, controlled.  History of lumbar stenosis with decompression surgery as indicated above, January 2002, status post L3-4, 4-5, 5-S1 decompression and grafting.  History of appendectomy.  History of nephrolithiasis x 3.  MEDICATIONS: 1.  Prinivil 20 mg p.o. q.d. 2. Zocor 40 mg p.o. q.d. 3. Hydrochlorothiazide 25 mg p.o. q.d.  ALLERGIES:  Noted in the chart to PREDNISONE, details unknown.  FAMILY HISTORY:  Patient denies any knowledge of familial clotting disorders, although his history is currently unreliable, given his amnestic state.  SOCIAL HISTORY:  Patient is known to have a girlfriend and he denies tobacco use.  I am not aware of his employment status.  We are attempting to contact family currently.  PHYSICAL EXAMINATION:  VITAL SIGNS:  On arrival, 8:10 a.m., blood pressure 103/73, pulse 120, respirations 30, oxygen saturation 80% on room air.  GENERAL:  At that time, he was noted to appear somewhat shocky and in respiratory distress, improved after oxygen was administered.  On my evaluation, he has nonlabored respirations and is resting comfortably. He is well-built and well-nourished, although somewhat deconditioned.  HEENT:  The eyes are clear with conjugate gaze and symmetric reactive pupils.  NECK:  There was no obvious JVD although he does have some supraclavicular fullness which is undefined bilaterally.  LUNGS:  Clear to auscultation with no audible rub.  HEART:  Tachycardic, regular rhythm, with no significant murmur, rub or gallop.  ABDOMEN:  Softly distended and symptomatic with no tenderness on palpation or discrete masses.  EXTREMITIES:  The right leg is notably larger than the left, with palpable superficial veins on the medial thigh, extending into the calf.  There is slight increased warmth to touch.  Peripheral pulses are strong bilaterally, and  capillary refill is brisk.  The extremities are warm and well-perfused.  SKIN:  Warm and dry.  NEUROLOGIC:  Mental status is confused.  He is able to respond to yes and no questions, although he is not able to recall recent events, remote event, or instant recall.  Speech is clear and he is able to follow commands. Neurologically, he moves  all extremities equally and sensation is grossly intact to touch.  Visual fields are full by confrontation.  LABORATORY DATA:  CT scan with results as noted above, formal report pending.  Urinalysis unremarkable.  Urine drug screen positive for barbiturates. Troponin 0.11.  Total CK 114.  Creatinine 1.2.  Antithrombin III 101, normal. PTT 24.  WBC of 12.4, hemoglobin 14.9, MCV 88.7.  EKG:  Sinus tachycardia, rate 111, with right bundle branch block.  ASSESSMENT AND PLAN:  Mr. Milhouse has extensive thromboembolic disease with multiple bilateral pulmonary emboli, some preserved flow, although not compensating as well as we would currently prefer.  I discussed situation briefly with Dr. Charlcie Cradle. Delford Field and Dr. Barbaraann Share, who have kindly provided consultation.  TPA has been initiated, which will be followed by heparin, given no discernible contraindications.  He was heme-negative on arrival.  Intensive care unit admission will occur, maintenance of oxygen saturation as required with supplemental O2.  He is currently saturating 95% on 40% FIO2.  Blood pressure systolic has increased to 125 after TPA initiated, pulse is still tachycardia at 110 to 120.  Etiology of his extensive thromboembolic disease not yet clear.  Possible this may have been developing over the preceding several months as a consequence of his spinal surgery (PAS hose were used as deep venous thrombosis prophylaxis while in the hospital), although I am unaware of what his activity level has been in the interim.  Will investigate familial disorders.  Initial hypercoagulability studies have been submitted prior to the initiation of medication.  He is not known to be a smoker, nor have obvious risk factors.  Mr. Straughter is currently in serious but stable condition, with close observation to ensue. DD:  07/30/00 TD:  07/30/00 Job: [id number]

## 2010-05-26 ENCOUNTER — Encounter (INDEPENDENT_AMBULATORY_CARE_PROVIDER_SITE_OTHER): Payer: Self-pay | Admitting: Surgery

## 2010-10-14 LAB — COMPREHENSIVE METABOLIC PANEL
AST: 29
Albumin: 4.2
Calcium: 9.6
Creatinine, Ser: 1.07
GFR calc Af Amer: >60
GFR calc non Af Amer: >60
Total Protein: 7.4

## 2010-10-14 LAB — DIFFERENTIAL
Eosinophils Relative: 1
Lymphocytes Relative: 12
Lymphs Abs: 1.5
Monocytes Absolute: 0.8
Monocytes Relative: 6

## 2010-10-14 LAB — URINALYSIS, ROUTINE W REFLEX MICROSCOPIC
Glucose, UA: NEGATIVE
Protein, ur: NEGATIVE
Specific Gravity, Urine: 1.019
Urobilinogen, UA: 0.2

## 2010-10-14 LAB — LIPASE, BLOOD: Lipase: 20

## 2010-10-14 LAB — CBC
MCHC: 34.2
MCV: 92
Platelets: 253
RDW: 13.7

## 2010-10-14 LAB — URINE MICROSCOPIC-ADD ON

## 2010-10-14 LAB — PROTIME-INR
INR: 2 — ABNORMAL HIGH
Prothrombin Time: 23.5 — ABNORMAL HIGH

## 2011-08-21 ENCOUNTER — Ambulatory Visit (INDEPENDENT_AMBULATORY_CARE_PROVIDER_SITE_OTHER): Payer: Medicare Other | Admitting: Surgery

## 2011-08-21 ENCOUNTER — Encounter (INDEPENDENT_AMBULATORY_CARE_PROVIDER_SITE_OTHER): Payer: Self-pay | Admitting: Surgery

## 2011-08-21 VITALS — BP 138/70 | HR 72 | Temp 97.9°F | Resp 18 | Ht 64.0 in | Wt 180.0 lb

## 2011-08-21 DIAGNOSIS — K432 Incisional hernia without obstruction or gangrene: Secondary | ICD-10-CM | POA: Insufficient documentation

## 2011-08-21 NOTE — Progress Notes (Addendum)
Re:   [name] DOB:   1940-12-26 MRN:   [id number]  ASSESSMENT AND PLAN: 1.  Abdominal wall hernia x 2- superior to midline incision and right lower quadrant through old appendectomy/ureterotomy incision.  I discussed the indications and complications of hernia surgery with the patient.  I discussed both the laparoscopic and open approach to hernia repair..  The potential risks of hernia surgery include, but are not limited to, bleeding, infection, open surgery, nerve injury, and recurrence of the hernia.  I provided the patient literature about hernia surgery.  Will schedule at a time convenient for him.  2.  Repair of ventral hernia - 01/24/2010  Parietex mesh. (15 x 25 cm) 3.  History of colectomy for diverticular disease - July 2011 4.  History of nephrolithiasis 5.  History of stroke - 2002  6.  On chronic coumadin for hypercoagulable state. 7.  Inferior vena caval filter - 2002 (done at the same time he had his stroke) 8.  Morbid obesity - BMI 30.1 9.  Diabetes Mellitus 10.  Hypertension.  11.  Hypercholesterolemia. 12.  Short term memory difficulties. [13.  Patient had cirrhotic changes in liver seen at laparoscopy.  Discussed with Christy at Dr. Mervyn Skeeters. Ross's office.  DN 09/16/2011]  Chief Complaint  Patient presents with  . Hernia   REFERRING PHYSICIAN: [name], MD  HISTORY OF PRESENT ILLNESS: [name] is a 71 y.o. (DOB: 09-02-1940)  white male whose primary care physician is [name], MD and comes to me today for follow up of abdominal wall hernias.  The one in his upper mid abdomen has gotten larger and that is why he is here today.  Neither hernia is bothering him, from a pain standpoint.  He is interested in getting both the hernias fixed.  I do not think there is a reason for a CT scan.  He has no other GI history and no new history since I last saw him.    Past Medical History  Diagnosis Date  . Hypertension   . Diabetes mellitus   .  Stroke   . Kidney stones   . Presence of inferior vena cava filter   . Hyperlipidemia       Past Surgical History  Procedure Date  . Appendectomy   . Back surgery   . Tonsillectomy and adenoidectomy   . Colon surgery 07/19/2009    sigmoid colectomy   . Hernia repair 02/03/2010    lap ventral hernia repair      Current Outpatient Prescriptions  Medication Sig Dispense Refill  . benazepril-hydrochlorthiazide (LOTENSIN HCT) 20-12.5 MG per tablet Take 1 tablet by mouth daily. Patient takes half a tab in the am and half tab in the pm      . glimepiride (AMARYL) 4 MG tablet Take 4 mg by mouth daily.        Marland Kitchen losartan (COZAAR) 100 MG tablet Take 100 mg by mouth daily.        . metFORMIN (GLUCOPHAGE) 500 MG tablet Take 500 mg by mouth daily.        . simvastatin (ZOCOR) 80 MG tablet Take 80 mg by mouth daily.        Marland Kitchen warfarin (COUMADIN) 2 MG tablet Take 2 mg by mouth daily.           Not on File  REVIEW OF SYSTEMS: Skin:  No history of rash.  No history of abnormal moles. Infection:  No history of hepatitis or  HIV.  No history of MRSA. Neurologic: History of stroke in 2002. Cardiac:  History of hypertension. Pulmonary:  Does not smoke cigarettes.  No asthma or bronchitis.  No OSA/CPAP.  Endocrine:  Diabetes mellitus - stable now.  Hypercholesterolemia. Gastrointestinal:  No history of stomach disease.  No history of liver disease.  No history of gall bladder disease.  No history of pancreas disease.  History of colectomy for diverticular disease - July 2011.  Appendectomy at age 59 - then kidney stone extraction through the same incision - then Dr. Zachery Dakins tried to repair hernia. Urologic:  History of nephrolithiasis.  Followed by Dr. Ranelle Oyster. Musculoskeletal:  No history of joint or back disease. Hematologic: Hypercoagulable.  On coumadin.  IVC filter about 2002. Psycho-social:  The patient is oriented.   The patient has no obvious psychologic or social impairment to  understanding our conversation and plan.  SOCIAL and FAMILY HISTORY: Single. But has girl friend, Kizzie Fantasia, who checks on him regularly. 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  PHYSICAL EXAM: BP 138/70  Pulse 72  Temp 97.9 F (36.6 C) (Oral)  Resp 18  Ht 5\' 4"  (1.626 m)  Wt 180 lb (81.647 kg)  BMI 30.90 kg/m2  General: WN mildly obese WM who is alert and generally healthy appearing.  HEENT: Normal. Pupils equal. Neck: Supple. No mass.  No thyroid mass. Lymph Nodes:  No supraclavicular or cervical nodes. Lungs: Clear to auscultation and symmetric breath sounds. Heart:  RRR. No murmur or rub. Abdomen: Soft. No mass. No tenderness. Normal bowel sounds.  Well healed midline incision.  Hernia cranial to the midline incision with 3 to 4 cm fascial defect.  The bulge is about 7 to 8 cm.  RLQ hernia with 2 to 3 cm fascial defect (this has not changed.) Rectal: Not done. Extremities:  Good strength and ROM  in upper and lower extremities. Neurologic:  Grossly intact to motor and sensory function. Psychiatric: Has normal mood and affect. Behavior is normal.   DATA REVIEWED: Old chart.  Ovidio Kin, MD,  [facility] [address] [place]  [address]   [place], [place]    [address] Phone:  [phone] FAX:  [phone]

## 2011-08-27 ENCOUNTER — Encounter (HOSPITAL_COMMUNITY): Payer: Self-pay

## 2011-08-27 ENCOUNTER — Encounter (HOSPITAL_COMMUNITY)
Admission: RE | Admit: 2011-08-27 | Discharge: 2011-08-27 | Disposition: A | Discharge status: Home / Self Care | Payer: Medicare Other | Source: Ambulatory Visit | Attending: Surgery | Admitting: Surgery

## 2011-08-27 ENCOUNTER — Encounter (HOSPITAL_COMMUNITY): Payer: Self-pay | Admitting: Pharmacy Technician

## 2011-08-27 ENCOUNTER — Ambulatory Visit (HOSPITAL_COMMUNITY)
Admission: RE | Admit: 2011-08-27 | Discharge: 2011-08-27 | Disposition: A | Discharge status: Home / Self Care | Payer: Medicare Other | Source: Ambulatory Visit | Attending: Surgery | Admitting: Surgery

## 2011-08-27 DIAGNOSIS — Z01812 Encounter for preprocedural laboratory examination: Secondary | ICD-10-CM | POA: Insufficient documentation

## 2011-08-27 DIAGNOSIS — K439 Ventral hernia without obstruction or gangrene: Secondary | ICD-10-CM | POA: Insufficient documentation

## 2011-08-27 DIAGNOSIS — Z01818 Encounter for other preprocedural examination: Secondary | ICD-10-CM | POA: Insufficient documentation

## 2011-08-27 LAB — CBC WITH DIFFERENTIAL/PLATELET
Basophils Relative: 0 % (ref 0–1)
Eosinophils Absolute: 0.2 10*3/uL (ref 0.0–0.7)
HCT: 43.7 % (ref 39.0–52.0)
Hemoglobin: 15 g/dL (ref 13.0–17.0)
MCH: 30.5 pg (ref 26.0–34.0)
MCHC: 34.3 g/dL (ref 30.0–36.0)
MCV: 89 fL (ref 78.0–100.0)
Monocytes Absolute: 0.6 10*3/uL (ref 0.1–1.0)
Monocytes Relative: 8 % (ref 3–12)

## 2011-08-27 LAB — COMPREHENSIVE METABOLIC PANEL
Albumin: 4.2 g/dL (ref 3.5–5.2)
BUN: 17 mg/dL (ref 6–23)
Creatinine, Ser: 0.94 mg/dL (ref 0.50–1.35)
GFR calc Af Amer: >90 mL/min (ref >90)
Total Bilirubin: 0.4 mg/dL (ref 0.3–1.2)
Total Protein: 7.4 g/dL (ref 6.0–8.3)

## 2011-08-27 LAB — SURGICAL PCR SCREEN
MRSA, PCR: NEGATIVE
Staphylococcus aureus: POSITIVE — AB

## 2011-08-27 LAB — PROTIME-INR
INR: 1.92 — ABNORMAL HIGH (ref 0.00–1.49)
Prothrombin Time: 22.3 seconds — ABNORMAL HIGH (ref 11.6–15.2)

## 2011-08-27 NOTE — Pre-Procedure Instructions (Signed)
Pre op instructions given using the etach back method

## 2011-08-27 NOTE — Patient Instructions (Addendum)
20 [name]  08/27/2011   Your procedure is scheduled on:  09-11-2011  Report to [facility] at  0530 AM.  Call this number if you have problems the morning of surgery: [phone]   Remember: come to hospital entrance right across from cancer center, after you turn on [address]. 1st driveway on left. Park in paved parking lot   Do not eat food or drink liquids:After Midnight.  .  Take these medicines the morning of surgery with A SIP OF WATER: no meds to take   Do not wear jewelry or make up.  Do not wear lotions, powders, or perfumes.Do not wear deodorant.    Do not bring valuables to the hospital.  Contacts, dentures or bridgework may not be worn into surgery.  Leave suitcase in the car. After surgery it may be brought to your room.  For patients admitted to the hospital, checkout time is 11:00 AM the day of discharge                             Patients discharged the day of surgery will not be allowed to drive home. If going home same day of surgery, you must have someone stay with you the first 24 hours at home and arrange for some one to drive you home from hospital.    Special Instructions: CHG Shower Use Special Wash: 1/2 bottle night before surgery and 1/2 bottle morning  of surgery, use regular soap on face and front and back private area. Women do not shave legs or underarms for 2 days before showers. Men may shave face morning of surgery.    Please read over the following fact sheets that you were given: MRSA Information  [facility] pre op nurse phone number [phone], call if needed

## 2011-09-11 ENCOUNTER — Encounter (HOSPITAL_COMMUNITY): Payer: Self-pay | Admitting: *Deleted

## 2011-09-11 ENCOUNTER — Encounter (HOSPITAL_COMMUNITY)
Admission: RE | Disposition: A | Discharge status: Home / Self Care | Payer: Self-pay | Source: Ambulatory Visit | Attending: Surgery

## 2011-09-11 ENCOUNTER — Encounter (INDEPENDENT_AMBULATORY_CARE_PROVIDER_SITE_OTHER): Payer: Self-pay | Admitting: Surgery

## 2011-09-11 ENCOUNTER — Inpatient Hospital Stay (HOSPITAL_COMMUNITY)
Admission: RE | Admit: 2011-09-11 | Discharge: 2011-09-14 | DRG: 336 | Disposition: A | Discharge status: Home / Self Care | Payer: Medicare Other | Source: Ambulatory Visit | Attending: Surgery | Admitting: Surgery

## 2011-09-11 ENCOUNTER — Encounter (HOSPITAL_COMMUNITY): Payer: Self-pay | Admitting: Registered Nurse

## 2011-09-11 ENCOUNTER — Ambulatory Visit (HOSPITAL_COMMUNITY): Payer: Medicare Other | Admitting: Registered Nurse

## 2011-09-11 DIAGNOSIS — K7460 Unspecified cirrhosis of liver: Secondary | ICD-10-CM | POA: Diagnosis present

## 2011-09-11 DIAGNOSIS — E119 Type 2 diabetes mellitus without complications: Secondary | ICD-10-CM | POA: Diagnosis present

## 2011-09-11 DIAGNOSIS — K660 Peritoneal adhesions (postprocedural) (postinfection): Secondary | ICD-10-CM | POA: Diagnosis present

## 2011-09-11 DIAGNOSIS — Z7901 Long term (current) use of anticoagulants: Secondary | ICD-10-CM

## 2011-09-11 DIAGNOSIS — E7800 Pure hypercholesterolemia, unspecified: Secondary | ICD-10-CM | POA: Diagnosis present

## 2011-09-11 DIAGNOSIS — K432 Incisional hernia without obstruction or gangrene: Principal | ICD-10-CM | POA: Diagnosis present

## 2011-09-11 DIAGNOSIS — Z9049 Acquired absence of other specified parts of digestive tract: Secondary | ICD-10-CM

## 2011-09-11 DIAGNOSIS — I69998 Other sequelae following unspecified cerebrovascular disease: Secondary | ICD-10-CM

## 2011-09-11 DIAGNOSIS — D6859 Other primary thrombophilia: Secondary | ICD-10-CM | POA: Diagnosis present

## 2011-09-11 DIAGNOSIS — R413 Other amnesia: Secondary | ICD-10-CM | POA: Diagnosis present

## 2011-09-11 DIAGNOSIS — I10 Essential (primary) hypertension: Secondary | ICD-10-CM | POA: Diagnosis present

## 2011-09-11 DIAGNOSIS — R339 Retention of urine, unspecified: Secondary | ICD-10-CM | POA: Diagnosis present

## 2011-09-11 DIAGNOSIS — K458 Other specified abdominal hernia without obstruction or gangrene: Secondary | ICD-10-CM | POA: Diagnosis present

## 2011-09-11 HISTORY — PX: VENTRAL HERNIA REPAIR: SHX424

## 2011-09-11 LAB — GLUCOSE, CAPILLARY
Glucose-Capillary: 122 mg/dL — ABNORMAL HIGH (ref 70–99)
Glucose-Capillary: 155 mg/dL — ABNORMAL HIGH (ref 70–99)

## 2011-09-11 SURGERY — REPAIR, HERNIA, VENTRAL, LAPAROSCOPIC
Anesthesia: General | Site: Abdomen | Wound class: Clean

## 2011-09-11 MED ORDER — METFORMIN HCL 500 MG PO TABS
1000.0000 mg | ORAL_TABLET | Freq: Two times a day (BID) | ORAL | Status: DC
  Filled 2011-09-11 (×2): qty 2

## 2011-09-11 MED ORDER — LOSARTAN POTASSIUM 50 MG PO TABS: 100.0000 mg | ORAL_TABLET | Freq: Every morning | ORAL | Status: DC

## 2011-09-11 MED ORDER — CEFAZOLIN SODIUM-DEXTROSE 2-3 GM-% IV SOLR
2.0000 g | INTRAVENOUS | Status: AC
  Administered 2011-09-11: 2 g via INTRAVENOUS

## 2011-09-11 MED ORDER — ACETAMINOPHEN 10 MG/ML IV SOLN
INTRAVENOUS | Status: AC
  Filled 2011-09-11: qty 100

## 2011-09-11 MED ORDER — NEOSTIGMINE METHYLSULFATE 1 MG/ML IJ SOLN
INTRAMUSCULAR | Status: DC | PRN
  Administered 2011-09-11: 3 mg via INTRAVENOUS

## 2011-09-11 MED ORDER — MORPHINE SULFATE 2 MG/ML IJ SOLN
1.0000 mg | INTRAMUSCULAR | Status: DC | PRN
  Administered 2011-09-11 (×2): 2 mg via INTRAVENOUS
  Filled 2011-09-11 (×2): qty 1

## 2011-09-11 MED ORDER — HYDROCHLOROTHIAZIDE 12.5 MG PO CAPS
12.5000 mg | ORAL_CAPSULE | Freq: Every day | ORAL | Status: DC
  Administered 2011-09-12 – 2011-09-14 (×3): 12.5 mg via ORAL
  Filled 2011-09-11 (×3): qty 1

## 2011-09-11 MED ORDER — INSULIN ASPART 100 UNIT/ML ~~LOC~~ SOLN
0.0000 [IU] | SUBCUTANEOUS | Status: DC
  Administered 2011-09-11: 2 [IU] via SUBCUTANEOUS
  Administered 2011-09-11: 1 [IU] via SUBCUTANEOUS
  Administered 2011-09-12 (×2): 2 [IU] via SUBCUTANEOUS
  Administered 2011-09-12: 3 [IU] via SUBCUTANEOUS

## 2011-09-11 MED ORDER — ACETAMINOPHEN 10 MG/ML IV SOLN
INTRAVENOUS | Status: DC | PRN
  Administered 2011-09-11: 1000 mg via INTRAVENOUS

## 2011-09-11 MED ORDER — BUPIVACAINE HCL (PF) 0.5 % IJ SOLN
INTRAMUSCULAR | Status: AC
  Filled 2011-09-11: qty 30

## 2011-09-11 MED ORDER — FENTANYL CITRATE 0.05 MG/ML IJ SOLN: 25.0000 ug | INTRAMUSCULAR | Status: DC | PRN

## 2011-09-11 MED ORDER — CEFAZOLIN SODIUM-DEXTROSE 2-3 GM-% IV SOLR
INTRAVENOUS | Status: AC
  Filled 2011-09-11: qty 50

## 2011-09-11 MED ORDER — LABETALOL HCL 5 MG/ML IV SOLN
INTRAVENOUS | Status: DC | PRN
  Administered 2011-09-11 (×2): 5 mg via INTRAVENOUS

## 2011-09-11 MED ORDER — LACTATED RINGERS IV SOLN: INTRAVENOUS | Status: DC

## 2011-09-11 MED ORDER — ONDANSETRON HCL 4 MG/2ML IJ SOLN: 4.0000 mg | Freq: Four times a day (QID) | INTRAMUSCULAR | Status: DC | PRN

## 2011-09-11 MED ORDER — LOSARTAN POTASSIUM 50 MG PO TABS
100.0000 mg | ORAL_TABLET | Freq: Every day | ORAL | Status: DC
  Filled 2011-09-11: qty 2

## 2011-09-11 MED ORDER — GLIPIZIDE 5 MG PO TABS
5.0000 mg | ORAL_TABLET | Freq: Every morning | ORAL | Status: DC
Start: 2011-09-12 — End: 2011-09-14
  Administered 2011-09-12 – 2011-09-14 (×3): 5 mg via ORAL
  Filled 2011-09-11 (×3): qty 1

## 2011-09-11 MED ORDER — PROPOFOL 10 MG/ML IV BOLUS
INTRAVENOUS | Status: DC | PRN
  Administered 2011-09-11: 180 mg via INTRAVENOUS

## 2011-09-11 MED ORDER — PHENYLEPHRINE HCL 10 MG/ML IJ SOLN
INTRAMUSCULAR | Status: DC | PRN
  Administered 2011-09-11 (×4): 80 ug via INTRAVENOUS

## 2011-09-11 MED ORDER — LOSARTAN POTASSIUM 50 MG PO TABS
100.0000 mg | ORAL_TABLET | Freq: Every day | ORAL | Status: DC
  Administered 2011-09-12 – 2011-09-14 (×3): 100 mg via ORAL
  Filled 2011-09-11 (×3): qty 2

## 2011-09-11 MED ORDER — BUPIVACAINE HCL 0.5 % IJ SOLN
INTRAMUSCULAR | Status: DC | PRN
  Administered 2011-09-11: 30 mL

## 2011-09-11 MED ORDER — HETASTARCH-ELECTROLYTES 6 % IV SOLN
INTRAVENOUS | Status: DC | PRN
  Administered 2011-09-11: 10:00:00 via INTRAVENOUS

## 2011-09-11 MED ORDER — LACTATED RINGERS IV SOLN
INTRAVENOUS | Status: DC
Start: 2011-09-11 — End: 2011-09-11

## 2011-09-11 MED ORDER — KCL IN DEXTROSE-NACL 20-5-0.45 MEQ/L-%-% IV SOLN
INTRAVENOUS | Status: DC
  Administered 2011-09-11 (×2): via INTRAVENOUS
  Administered 2011-09-12: 75 mL/h via INTRAVENOUS
  Administered 2011-09-13: 02:00:00 via INTRAVENOUS
  Filled 2011-09-11 (×6): qty 1000

## 2011-09-11 MED ORDER — ROCURONIUM BROMIDE 100 MG/10ML IV SOLN
INTRAVENOUS | Status: DC | PRN
  Administered 2011-09-11: 20 mg via INTRAVENOUS
  Administered 2011-09-11: 5 mg via INTRAVENOUS
  Administered 2011-09-11: 10 mg via INTRAVENOUS
  Administered 2011-09-11: 45 mg via INTRAVENOUS

## 2011-09-11 MED ORDER — SUFENTANIL CITRATE 50 MCG/ML IV SOLN
INTRAVENOUS | Status: DC | PRN
  Administered 2011-09-11 (×4): 5 ug via INTRAVENOUS
  Administered 2011-09-11 (×2): 10 ug via INTRAVENOUS
  Administered 2011-09-11: 15 ug via INTRAVENOUS

## 2011-09-11 MED ORDER — HYDROMORPHONE HCL PF 1 MG/ML IJ SOLN
INTRAMUSCULAR | Status: DC | PRN
  Administered 2011-09-11: 1 mg via INTRAVENOUS

## 2011-09-11 MED ORDER — HYDROCHLOROTHIAZIDE 25 MG PO TABS: 12.5000 mg | ORAL_TABLET | Freq: Every day | ORAL | Status: DC

## 2011-09-11 MED ORDER — LACTATED RINGERS IV SOLN
INTRAVENOUS | Status: DC | PRN
  Administered 2011-09-11 (×2): via INTRAVENOUS

## 2011-09-11 MED ORDER — ONDANSETRON HCL 4 MG/2ML IJ SOLN
INTRAMUSCULAR | Status: DC | PRN
  Administered 2011-09-11: 4 mg via INTRAVENOUS

## 2011-09-11 MED ORDER — ONDANSETRON HCL 4 MG PO TABS: 4.0000 mg | ORAL_TABLET | Freq: Four times a day (QID) | ORAL | Status: DC | PRN

## 2011-09-11 MED ORDER — KCL IN DEXTROSE-NACL 20-5-0.45 MEQ/L-%-% IV SOLN
INTRAVENOUS | Status: AC
  Filled 2011-09-11: qty 1000

## 2011-09-11 MED ORDER — 0.9 % SODIUM CHLORIDE (POUR BTL) OPTIME
TOPICAL | Status: DC | PRN
  Administered 2011-09-11: 1000 mL

## 2011-09-11 MED ORDER — HYDROCHLOROTHIAZIDE 12.5 MG PO CAPS
12.5000 mg | ORAL_CAPSULE | Freq: Every day | ORAL | Status: DC
  Filled 2011-09-11: qty 1

## 2011-09-11 MED ORDER — LIDOCAINE HCL (CARDIAC) 20 MG/ML IV SOLN
INTRAVENOUS | Status: DC | PRN
  Administered 2011-09-11: 30 mg via INTRAVENOUS

## 2011-09-11 MED ORDER — HEPARIN SODIUM (PORCINE) 5000 UNIT/ML IJ SOLN
5000.0000 [IU] | Freq: Three times a day (TID) | INTRAMUSCULAR | Status: DC
  Administered 2011-09-11 – 2011-09-14 (×8): 5000 [IU] via SUBCUTANEOUS
  Filled 2011-09-11 (×12): qty 1

## 2011-09-11 MED ORDER — PROMETHAZINE HCL 25 MG/ML IJ SOLN: 6.2500 mg | INTRAMUSCULAR | Status: DC | PRN

## 2011-09-11 MED ORDER — OXYCODONE HCL 5 MG PO TABS
5.0000 mg | ORAL_TABLET | ORAL | Status: DC | PRN
  Administered 2011-09-11 – 2011-09-13 (×3): 5 mg via ORAL
  Filled 2011-09-11 (×3): qty 1

## 2011-09-11 MED ORDER — GLYCOPYRROLATE 0.2 MG/ML IJ SOLN
INTRAMUSCULAR | Status: DC | PRN
  Administered 2011-09-11: 0.4 mg via INTRAVENOUS

## 2011-09-11 MED ORDER — LIDOCAINE HCL 4 % MT SOLN
OROMUCOSAL | Status: DC | PRN
  Administered 2011-09-11: 4 mL via TOPICAL

## 2011-09-11 MED ORDER — METFORMIN HCL 500 MG PO TABS
1000.0000 mg | ORAL_TABLET | Freq: Two times a day (BID) | ORAL | Status: DC
  Administered 2011-09-12 – 2011-09-14 (×4): 1000 mg via ORAL
  Filled 2011-09-11 (×6): qty 2

## 2011-09-11 MED ORDER — SUCCINYLCHOLINE CHLORIDE 20 MG/ML IJ SOLN
INTRAMUSCULAR | Status: DC | PRN
  Administered 2011-09-11: 100 mg via INTRAVENOUS

## 2011-09-11 SURGICAL SUPPLY — 52 items
ADH SKN CLS APL DERMABOND .7 (GAUZE/BANDAGES/DRESSINGS) ×1
APL SKNCLS STERI-STRIP NONHPOA (GAUZE/BANDAGES/DRESSINGS) ×1
BENZOIN TINCTURE PRP APPL 2/3 (GAUZE/BANDAGES/DRESSINGS) ×2 IMPLANT
BINDER ABD UNIV 12 45-62 (WOUND CARE) ×1 IMPLANT
BINDER ABDOMINAL 46IN 62IN (WOUND CARE) ×2
CANISTER SUCTION 2500CC (MISCELLANEOUS) ×2 IMPLANT
CLOTH BEACON ORANGE TIMEOUT ST (SAFETY) ×2 IMPLANT
CLSR STERI-STRIP ANTIMIC 1/2X4 (GAUZE/BANDAGES/DRESSINGS) ×1 IMPLANT
DECANTER SPIKE VIAL GLASS SM (MISCELLANEOUS) ×2 IMPLANT
DERMABOND ADVANCED (GAUZE/BANDAGES/DRESSINGS) ×1
DERMABOND ADVANCED .7 DNX12 (GAUZE/BANDAGES/DRESSINGS) IMPLANT
DEVICE SECURE STRAP 25 ABSORB (INSTRUMENTS) ×2 IMPLANT
DEVICE TROCAR PUNCTURE CLOSURE (ENDOMECHANICALS) ×2 IMPLANT
DISSECTOR BLUNT TIP ENDO 5MM (MISCELLANEOUS) IMPLANT
DRAIN CHANNEL RND F F (WOUND CARE) IMPLANT
DRAPE INCISE IOBAN 66X45 STRL (DRAPES) ×2 IMPLANT
DRAPE LAPAROSCOPIC ABDOMINAL (DRAPES) ×2 IMPLANT
DRAPE UTILITY XL STRL (DRAPES) ×2 IMPLANT
ELECT REM PT RETURN 9FT ADLT (ELECTROSURGICAL) ×2
ELECTRODE REM PT RTRN 9FT ADLT (ELECTROSURGICAL) ×1 IMPLANT
EVACUATOR SILICONE 100CC (DRAIN) IMPLANT
GLOVE BIOGEL PI IND STRL 7.0 (GLOVE) ×1 IMPLANT
GLOVE BIOGEL PI INDICATOR 7.0 (GLOVE) ×1
GLOVE SURG SIGNA 7.5 PF LTX (GLOVE) ×2 IMPLANT
GOWN STRL NON-REIN LRG LVL3 (GOWN DISPOSABLE) ×2 IMPLANT
GOWN STRL REIN XL XLG (GOWN DISPOSABLE) ×5 IMPLANT
KIT BASIN OR (CUSTOM PROCEDURE TRAY) ×2 IMPLANT
MESH PARIETEX 20X15 (Mesh General) ×1 IMPLANT
NDL INSUFFLATION 14GA 150MM (NEEDLE) IMPLANT
NDL SPNL 22GX3.5 QUINCKE BK (NEEDLE) ×1 IMPLANT
NEEDLE INSUFFLATION 14GA 150MM (NEEDLE) IMPLANT
NEEDLE SPNL 22GX3.5 QUINCKE BK (NEEDLE) ×2 IMPLANT
NS IRRIG 1000ML POUR BTL (IV SOLUTION) ×2 IMPLANT
PEN SKIN MARKING BROAD (MISCELLANEOUS) ×2 IMPLANT
PENCIL BUTTON HOLSTER BLD 10FT (ELECTRODE) ×2 IMPLANT
SCALPEL HARMONIC ACE (MISCELLANEOUS) ×2 IMPLANT
SCISSORS LAP 5X35 DISP (ENDOMECHANICALS) ×2 IMPLANT
SET IRRIG TUBING LAPAROSCOPIC (IRRIGATION / IRRIGATOR) ×1 IMPLANT
SET TUBE IRRIG SUCTION NO TIP (IRRIGATION / IRRIGATOR) ×1 IMPLANT
SOLUTION ANTI FOG 6CC (MISCELLANEOUS) ×2 IMPLANT
STAPLER VISISTAT 35W (STAPLE) IMPLANT
STRIP CLOSURE SKIN 1/4X4 (GAUZE/BANDAGES/DRESSINGS) ×2 IMPLANT
SUT NOVA 0 T19/GS 22DT (SUTURE) ×5 IMPLANT
SUT VIC AB 5-0 PS2 18 (SUTURE) ×4 IMPLANT
TACKER 5MM HERNIA 3.5CML NAB (ENDOMECHANICALS) ×4 IMPLANT
TOWEL OR 17X26 10 PK STRL BLUE (TOWEL DISPOSABLE) ×3 IMPLANT
TOWEL OR NON WOVEN STRL DISP B (DISPOSABLE) ×1 IMPLANT
TRAY FOLEY CATH 14FRSI W/METER (CATHETERS) ×2 IMPLANT
TRAY LAP CHOLE (CUSTOM PROCEDURE TRAY) ×2 IMPLANT
TROCAR BLADELESS OPT 5 75 (ENDOMECHANICALS) ×7 IMPLANT
TROCAR XCEL NON-BLD 11X100MML (ENDOMECHANICALS) ×2 IMPLANT
TUBING INSUFFLATION 10FT LAP (TUBING) ×2 IMPLANT

## 2011-09-11 NOTE — H&P (View-Only) (Signed)
Re:   [name] DOB:   Mar 02, 1940 MRN:   [id number]  ASSESSMENT AND PLAN: 1.  Abdominal wall hernia x 2- superior to midline incision and right lower quadrant through old appendectomy/ureterotomy incision.  I discussed the indications and complications of hernia surgery with the patient.  I discussed both the laparoscopic and open approach to hernia repair..  The potential risks of hernia surgery include, but are not limited to, bleeding, infection, open surgery, nerve injury, and recurrence of the hernia.  I provided the patient literature about hernia surgery.  Will schedule at a time convenient for him.  2.  Repair of ventral hernia - 01/24/2010  Parietex mesh. (15 x 25 cm) 3.  History of colectomy for diverticular disease - July 2011 4.  History of nephrolithiasis 5.  History of stroke - 2002  6.  On chronic coumadin for hypercoagulable state. 7.  Inferior vena caval filter - 2002 (done at the same time he had his stroke) 8.  Morbid obesity - BMI 30.1 9.  Diabetes Mellitus 10.  Hypertension.  11.  Hypercholesterolemia. 12.  Short term memory difficulties.   Chief Complaint  Patient presents with  . Hernia   REFERRING PHYSICIAN: [name], MD  HISTORY OF PRESENT ILLNESS: [name] is a 71 y.o. (DOB: [birth year])  white male whose primary care physician is [name], MD and comes to me today for follow up of abdominal wall hernias.  The one in his upper mid abdomen has gotten larger and that is why he is here today.  Neither hernia is bothering him, from a pain standpoint.  He is interested in getting both the hernias fixed.  I do not think there is a reason for a CT scan.  He has no other GI history and no new history since I last saw him.    Past Medical History  Diagnosis Date  . Hypertension   . Diabetes mellitus   . Stroke   . Kidney stones   . Presence of inferior vena cava filter   . Hyperlipidemia       Past Surgical History    Procedure Date  . Appendectomy   . Back surgery   . Tonsillectomy and adenoidectomy   . Colon surgery 07/19/2009    sigmoid colectomy   . Hernia repair 02/03/2010    lap ventral hernia repair      Current Outpatient Prescriptions  Medication Sig Dispense Refill  . benazepril-hydrochlorthiazide (LOTENSIN HCT) 20-12.5 MG per tablet Take 1 tablet by mouth daily. Patient takes half a tab in the am and half tab in the pm      . glimepiride (AMARYL) 4 MG tablet Take 4 mg by mouth daily.        Marland Kitchen losartan (COZAAR) 100 MG tablet Take 100 mg by mouth daily.        . metFORMIN (GLUCOPHAGE) 500 MG tablet Take 500 mg by mouth daily.        . simvastatin (ZOCOR) 80 MG tablet Take 80 mg by mouth daily.        Marland Kitchen warfarin (COUMADIN) 2 MG tablet Take 2 mg by mouth daily.           Not on File  REVIEW OF SYSTEMS: Skin:  No history of rash.  No history of abnormal moles. Infection:  No history of hepatitis or HIV.  No history of MRSA. Neurologic: History of stroke in 2002. Cardiac:  History of hypertension. Pulmonary:  Does not  smoke cigarettes.  No asthma or bronchitis.  No OSA/CPAP.  Endocrine:  Diabetes mellitus - stable now.  Hypercholesterolemia. Gastrointestinal:  No history of stomach disease.  No history of liver disease.  No history of gall bladder disease.  No history of pancreas disease.  History of colectomy for diverticular disease - July 2011.  Appendectomy at age 73 - then kidney stone extraction through the same incision - then Dr. Zachery Dakins tried to repair hernia. Urologic:  History of nephrolithiasis.  Followed by Dr. Ranelle Oyster. Musculoskeletal:  No history of joint or back disease. Hematologic: Hypercoagulable.  On coumadin.  IVC filter about 2002. Psycho-social:  The patient is oriented.   The patient has no obvious psychologic or social impairment to understanding our conversation and plan.  SOCIAL and FAMILY HISTORY: Single. But has girl friend, Kizzie Fantasia, who checks on  him regularly. 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  PHYSICAL EXAM: BP 138/70  Pulse 72  Temp 97.9 F (36.6 C) (Oral)  Resp 18  Ht 5\' 4"  (1.626 m)  Wt 180 lb (81.647 kg)  BMI 30.90 kg/m2  General: WN mildly obese WM who is alert and generally healthy appearing.  HEENT: Normal. Pupils equal. Neck: Supple. No mass.  No thyroid mass. Lymph Nodes:  No supraclavicular or cervical nodes. Lungs: Clear to auscultation and symmetric breath sounds. Heart:  RRR. No murmur or rub. Abdomen: Soft. No mass. No tenderness. Normal bowel sounds.  Well healed midline incision.  Hernia cranial to the midline incision with 3 to 4 cm fascial defect.  The bulge is about 7 to 8 cm.  RLQ hernia with 2 to 3 cm fascial defect (this has not changed.) Rectal: Not done. Extremities:  Good strength and ROM  in upper and lower extremities. Neurologic:  Grossly intact to motor and sensory function. Psychiatric: Has normal mood and affect. Behavior is normal.   DATA REVIEWED: Old chart.  Ovidio Kin, MD,  [facility] [address] [place]  [address]   [place], [place]    [address] Phone:  [phone] FAX:  [phone]

## 2011-09-11 NOTE — Brief Op Note (Signed)
09/11/2011  10:47 AM  PATIENT:  [name], 71 y.o., male, MRN: [id number]  PREOP DIAGNOSIS:  ventral incisional hernia x 2  POSTOP DIAGNOSIS:   2 ventral incisional hernias, superior hernia (4 x 6 cm) and right lower quadrant (3 x 5 cm), adhesions to undersurface of prior mesh repair, moderate cirrhotic changes in liver  PROCEDURE:   Procedure(s): LAPAROSCOPIC VENTRAL HERNIA x 2, INSERTION OF Parietex MESH (10 x 15 cm mesh for superior hernia and 10 x 12 cm mesh for right lower quadrant mesh), LAPAROSCOPIC LYSIS OF ADHESIONS (45 minutes)  SURGEON:   Ovidio Kin, M.D.  ASSISTANT:   none  ANESTHESIA:   general  Einar Pheasant, MD - Anesthesiologist Illene Silver, CRNA - CRNA Elisabeth Cara, CRNA - CRNA  General  EBL:  minimal  ml  BLOOD ADMINISTERED: none  DRAINS: none   LOCAL MEDICATIONS USED:   25 cc 1/4% marcaine  SPECIMEN:   none  COUNTS CORRECT:  YES  INDICATIONS FOR PROCEDURE:  [name] is a 71 y.o. (DOB: February 10, 1940) white male whose primary care physician is [name], MD and comes for repair of ventral incisional hernias x 2 (superior hernia and right lower quadrant hernia)   The indications and risks of the surgery were explained to the patient.  The risks include, but are not limited to, infection, bleeding, and nerve injury.

## 2011-09-11 NOTE — Anesthesia Preprocedure Evaluation (Signed)
Anesthesia Evaluation  Patient identified by MRN, date of birth, ID band Patient awake    Reviewed: Allergy & Precautions, H&P , NPO status , Patient's Chart, lab work & pertinent test results  Airway Mallampati: III TM Distance: >3 FB Neck ROM: Full  Mouth opening: Limited Mouth Opening  Dental  (+) Teeth Intact, Poor Dentition and Chipped,    Pulmonary neg pulmonary ROS,  breath sounds clear to auscultation  Pulmonary exam normal       Cardiovascular hypertension, Pt. on medications Rhythm:Regular Rate:Normal     Neuro/Psych CVA, Residual Symptoms negative psych ROS   GI/Hepatic negative GI ROS, Neg liver ROS,   Endo/Other  diabetes, Type 2, Oral Hypoglycemic Agents  Renal/GU Renal disease  negative genitourinary   Musculoskeletal negative musculoskeletal ROS (+)   Abdominal (+) + obese,   Peds negative pediatric ROS (+)  Hematology negative hematology ROS (+)   Anesthesia Other Findings   Reproductive/Obstetrics negative OB ROS                           Anesthesia Physical Anesthesia Plan  ASA: III  Anesthesia Plan: General   Post-op Pain Management:    Induction: Intravenous  Airway Management Planned: Oral ETT  Additional Equipment:   Intra-op Plan:   Post-operative Plan: Extubation in OR  Informed Consent: I have reviewed the patients History and Physical, chart, labs and discussed the procedure including the risks, benefits and alternatives for the proposed anesthesia with the patient or authorized representative who has indicated his/her understanding and acceptance.   Dental advisory given  Plan Discussed with: CRNA  Anesthesia Plan Comments:         Anesthesia Quick Evaluation

## 2011-09-11 NOTE — Anesthesia Procedure Notes (Addendum)
Performed by: Anastasio Champion E   Procedure Name: Intubation Date/Time: 09/11/2011 7:46 AM Performed by: Illene Silver Pre-anesthesia Checklist: Patient identified, Emergency Drugs available, Suction available and Patient being monitored Patient Re-evaluated:Patient Re-evaluated prior to inductionOxygen Delivery Method: Circle system utilized Preoxygenation: Pre-oxygenation with 100% oxygen Intubation Type: IV induction and Cricoid Pressure applied Ventilation: Oral airway inserted - appropriate to patient size Grade View: Grade III Tube type: Oral Tube size: 8.0 mm Number of attempts: 2 Airway Equipment and Method: Stylet and Bougie stylet Placement Confirmation: ETT inserted through vocal cords under direct vision,  breath sounds checked- equal and bilateral and positive ETCO2 Secured at: 21 cm Tube secured with: Tape Dental Injury: Teeth and Oropharynx as per pre-operative assessment  Difficulty Due To: Difficulty was anticipated, Difficult Airway- due to large tongue, Difficult Airway- due to anterior larynx and Difficult Airway- due to limited oral opening Future Recommendations: Recommend- induction with short-acting agent, and alternative techniques readily available Comments: Pt preoxygenated well. Unalbe to lift epiglottis from posterior arytenoids with cricoid MAC4. Slide bougie under epiglottis and Intubated atraumaticaly, BBS= +ETCO2

## 2011-09-11 NOTE — Anesthesia Postprocedure Evaluation (Signed)
Anesthesia Post Note  Patient: [name]  Procedure(s) Performed: Procedure(s) (LRB): LAPAROSCOPIC VENTRAL HERNIA (N/A) INSERTION OF MESH (N/A) LAPAROSCOPIC LYSIS OF ADHESIONS (N/A)  Anesthesia type: General  Patient location: PACU  Post pain: Pain level controlled  Post assessment: Post-op Vital signs reviewed  Last Vitals:  Filed Vitals:   09/11/11 1800  BP: 122/70  Pulse: 78  Temp: 36.9 C  Resp: 18    Post vital signs: Reviewed  Level of consciousness: sedated  Complications: No apparent anesthesia complications

## 2011-09-11 NOTE — Interval H&P Note (Signed)
History and Physical Interval Note:  09/11/2011 7:28 AM  [name]  has presented today for surgery, with the diagnosis of ventral incisional hernia  The various methods of treatment have been discussed with the patient and family.  Kizzie Fantasia, his friend, is here today.  His INR (1.15) is in the normal range.  After consideration of risks, benefits and other options for treatment, the patient has consented to  Procedure(s) (LRB) with comments: LAPAROSCOPIC VENTRAL HERNIA (N/A) - Lapaaroscopic Repair Ventral Incisional Hernias as a surgical intervention .   The patient's history has been reviewed, patient examined, no change in status, stable for surgery.  I have reviewed the patient's chart and labs.  Questions were answered to the patient's satisfaction.     Tejal Monroy H

## 2011-09-11 NOTE — Transfer of Care (Signed)
Immediate Anesthesia Transfer of Care Note  Patient: [name]  Procedure(s) Performed: Procedure(s) (LRB) with comments: LAPAROSCOPIC VENTRAL HERNIA (N/A) - Lapaaroscopic Repair Ventral Incisional Hernias x 2 INSERTION OF MESH (N/A) LAPAROSCOPIC LYSIS OF ADHESIONS (N/A) - 35 minutes total time  Patient Location: PACU  Anesthesia Type: General  Level of Consciousness: awake, sedated and patient cooperative  Airway & Oxygen Therapy: Patient Spontanous Breathing and Patient connected to face mask oxygen  Post-op Assessment: Report given to PACU RN, Post -op Vital signs reviewed and stable and Patient moving all extremities X 4  Post vital signs: stable  Complications: No apparent anesthesia complications

## 2011-09-12 LAB — GLUCOSE, CAPILLARY
Glucose-Capillary: 145 mg/dL — ABNORMAL HIGH (ref 70–99)
Glucose-Capillary: 154 mg/dL — ABNORMAL HIGH (ref 70–99)
Glucose-Capillary: 121 mg/dL — ABNORMAL HIGH (ref 70–99)
Glucose-Capillary: 95 mg/dL (ref 70–99)
Glucose-Capillary: 102 mg/dL — ABNORMAL HIGH (ref 70–99)

## 2011-09-12 LAB — CBC
Hemoglobin: 13.3 g/dL (ref 13.0–17.0)
MCHC: 33.7 g/dL (ref 30.0–36.0)
Platelets: 204 10*3/uL (ref 150–400)
RBC: 4.4 MIL/uL (ref 4.22–5.81)

## 2011-09-12 LAB — COMPREHENSIVE METABOLIC PANEL
CO2: 26 mEq/L (ref 19–32)
Calcium: 8.6 mg/dL (ref 8.4–10.5)
Chloride: 96 mEq/L (ref 96–112)
Creatinine, Ser: 1.06 mg/dL (ref 0.50–1.35)
GFR calc Af Amer: 80 mL/min — ABNORMAL LOW (ref >90)
GFR calc non Af Amer: 69 mL/min — ABNORMAL LOW (ref >90)
Glucose, Bld: 145 mg/dL — ABNORMAL HIGH (ref 70–99)
Total Bilirubin: 1 mg/dL (ref 0.3–1.2)

## 2011-09-12 MED ORDER — INSULIN ASPART 100 UNIT/ML ~~LOC~~ SOLN
0.0000 [IU] | Freq: Three times a day (TID) | SUBCUTANEOUS | Status: DC
  Administered 2011-09-12 – 2011-09-13 (×2): 2 [IU] via SUBCUTANEOUS
  Administered 2011-09-13: 3 [IU] via SUBCUTANEOUS
  Administered 2011-09-14: 2 [IU] via SUBCUTANEOUS

## 2011-09-12 NOTE — Progress Notes (Signed)
General Surgery Note  LOS: 1 day  POD# 1 Room - 1536  Assessment/Plan: 1.  LAPAROSCOPIC VENTRAL HERNIA repair x 2, INSERTION OF MESH, LAPAROSCOPIC LYSIS OF ADHESIONS - D. Ipek Westra - 09/11/2011  Doing okay.  To increase diet.  Needs to ambulate.   2. Repair of ventral hernia - 01/24/2010 - Parietex mesh. (15 x 25 cm)  3. History of colectomy for diverticular disease - July 2011  4. History of nephrolithiasis  5. History of stroke - 2002   6. On chronic coumadin for hypercoagulable state.  7. Inferior vena caval filter - 2002 (done at the same time he had his stroke)  8. Morbid obesity - BMI 30.1  9. Diabetes Mellitus  10. Hypertension.   11. Hypercholesterolemia.  12. Short term memory difficulties. 13.  Required cath x 2 - will leave foley if done again 14.  DVT proph - SQ hep  Subjective:  A little passive.  Tolerating liquids well.  Has required cath x 2.  Needs to work on IS. Objective:   Filed Vitals:   09/12/11 0539  BP: 109/68  Pulse: 81  Temp: 99.7 F (37.6 C)  Resp: 18     Intake/Output from previous day:  09/06 0701 - 09/07 0700 In: 5170.8 [I.V.:4670.8; IV Piggyback:500] Out: 2080 [Urine:1980; Blood:100]  Intake/Output this shift:      Physical Exam:   General: Obese older WM who is alert and oriented.    HEENT: Normal. Pupils equal. .   Lungs: Clear, though IS only 750.   Abdomen: Few BS.   Wound: dressed   Neurologic:  Grossly intact to motor and sensory function.     Lab Results:    [facility] 09/12/11 0450  WBC 9.0  HGB 13.3  HCT 39.5  PLT 204    BMET   Basename 09/12/11 0450  NA 133*  K 3.9  CL 96  CO2 26  GLUCOSE 145*  BUN 11  CREATININE 1.06  CALCIUM 8.6    PT/INR   Basename 09/11/11 0545  LABPROT 14.9  INR 1.15    ABG  No results found for this basename: PHART:2,PCO2:2,PO2:2,HCO3:2 in the last 72 hours   Studies/Results:  No results found.   Anti-infectives:   Anti-infectives     Start     Dose/Rate Route Frequency  Ordered Stop   09/11/11 0526   ceFAZolin (ANCEF) IVPB 2 g/50 mL premix        2 g 100 mL/hr over 30 Minutes Intravenous 60 min pre-op 09/11/11 0526 09/11/11 0737          Dustin Kin, MD, FACS Pager: [phone],   [facility] [place] [facility]: [phone] 09/12/2011

## 2011-09-12 NOTE — Plan of Care (Signed)
Problem: Phase I Progression Outcomes Goal: Voiding-avoid urinary catheter unless indicated Outcome: Not Progressing Unable to void adequate amounts spontaneously therefore foley placed per MD order at 1240.

## 2011-09-12 NOTE — Progress Notes (Signed)
Pt unable to void twice last night. Stated that he felt pressure in his bladder.  Bladder scanned 900 cc around 2100 and catherized 800 cc of clear yellow urine.  At approx. 0500 pt still only able to void small amounts.  Scanned 600 cc urine.  Catherized 600 cc of urine.  Will continue to monitor.

## 2011-09-13 LAB — GLUCOSE, CAPILLARY
Glucose-Capillary: 176 mg/dL — ABNORMAL HIGH (ref 70–99)
Glucose-Capillary: 116 mg/dL — ABNORMAL HIGH (ref 70–99)

## 2011-09-13 MED ORDER — TAMSULOSIN HCL 0.4 MG PO CAPS
0.4000 mg | ORAL_CAPSULE | Freq: Every day | ORAL | Status: DC
  Administered 2011-09-13 – 2011-09-14 (×2): 0.4 mg via ORAL
  Filled 2011-09-13 (×2): qty 1

## 2011-09-13 NOTE — Progress Notes (Signed)
General Surgery Note  LOS: 2 days  POD# 2 Room - 1536  Assessment/Plan: 1.  LAPAROSCOPIC VENTRAL HERNIA repair x 2, INSERTION OF MESH, LAPAROSCOPIC LYSIS OF ADHESIONS - D. Bonnetta Allbee - 09/11/2011  Doing okay.  Tolerated reg diet.  Ambulating   2. Repair of ventral hernia - 01/24/2010 - Parietex mesh. (15 x 25 cm)  3. History of colectomy for diverticular disease - July 2011  4. History of nephrolithiasis  5. History of stroke - 2002   6. On chronic coumadin for hypercoagulable state.  7. Inferior vena caval filter - 2002 (done at the same time he had his stroke)  8. Morbid obesity - BMI 30.1  9. Diabetes Mellitus  10. Hypertension.   11. Hypercholesterolemia.  12. Short term memory difficulties. 13.  Urinary retention  To start Flomax and try foley out first thing in the AM. 14.  DVT proph - SQ hep  Subjective:  Tolerating food.  Looks good.  But needed foley. Objective:   Filed Vitals:   09/13/11 0530  BP: 148/82  Pulse: 77  Temp: 98.6 F (37 C)  Resp: 20     Intake/Output from previous day:  09/07 0701 - 09/08 0700 In: 1188.8 [P.O.:340; I.V.:848.8] Out: 3400 [Urine:3400]  Intake/Output this shift:      Physical Exam:   General: Obese older WM who is alert and oriented.    HEENT: Normal. Pupils equal. .   Lungs: Clear, though IS only 750.   Abdomen: Good BS.   Wound: Look good   Neurologic:  Grossly intact to motor and sensory function.     Lab Results:     [facility] 09/12/11 0450  WBC 9.0  HGB 13.3  HCT 39.5  PLT 204    BMET    Basename 09/12/11 0450  NA 133*  K 3.9  CL 96  CO2 26  GLUCOSE 145*  BUN 11  CREATININE 1.06  CALCIUM 8.6    PT/INR    Basename 09/11/11 0545  LABPROT 14.9  INR 1.15    ABG  No results found for this basename: PHART:2,PCO2:2,PO2:2,HCO3:2 in the last 72 hours   Studies/Results:  No results found.   Anti-infectives:   Anti-infectives     Start     Dose/Rate Route Frequency Ordered Stop   09/11/11 0526    ceFAZolin (ANCEF) IVPB 2 g/50 mL premix        2 g 100 mL/hr over 30 Minutes Intravenous 60 min pre-op 09/11/11 0526 09/11/11 0737          Ovidio Kin, MD, FACS Pager: [phone],   [facility] [place] [facility]: [phone] 09/13/2011

## 2011-09-13 NOTE — Progress Notes (Signed)
Pt brought incentive spirometer from home to use to not be charged for a new one

## 2011-09-14 ENCOUNTER — Encounter (HOSPITAL_COMMUNITY): Payer: Self-pay | Admitting: Surgery

## 2011-09-14 MED ORDER — HYDROCODONE-ACETAMINOPHEN 5-325 MG PO TABS: 1.0000 | ORAL_TABLET | Freq: Four times a day (QID) | ORAL | Status: AC | PRN

## 2011-09-14 NOTE — Progress Notes (Signed)
Patient provided with discharge instructions and prescription, Patient verbalized understanding. Patient discharged to home. 

## 2011-09-14 NOTE — Progress Notes (Signed)
General Surgery Note  LOS: 3 days  POD# 3 Room - 1536  Assessment/Plan: 1.  LAPAROSCOPIC VENTRAL HERNIA repair x 2, INSERTION OF MESH, LAPAROSCOPIC LYSIS OF ADHESIONS - D. Marjean Imperato - 09/11/2011  Tolerated reg diet.  Ready for discharge.  D/C - #[id number]   2. Repair of ventral hernia - 01/24/2010 - Parietex mesh. (15 x 25 cm)  3. History of colectomy for diverticular disease - July 2011  4. History of nephrolithiasis  5. History of stroke - 2002   6. On chronic coumadin for hypercoagulable state.  7. Inferior vena caval filter - 2002 (done at the same time he had his stroke)  8. Morbid obesity - BMI 30.1  9. Diabetes Mellitus  10. Hypertension.   11. Hypercholesterolemia.  12. Short term memory difficulties. 13.  Urinary retention  Tolerated foley out.  Sees Dr. Annabell Howells for urology. 14.  DVT proph - SQ hep 15.  Changes of cirrhosis seen at laparoscopy.  Subjective:  Doing well.  Has voided since foley out.  Passing flatus, but not BM yet. Objective:   Filed Vitals:   09/14/11 0505  BP: 145/82  Pulse: 75  Temp: 98.2 F (36.8 C)  Resp: 18     Intake/Output from previous day:  09/08 0701 - 09/09 0700 In: 480 [P.O.:480] Out: 2800 [Urine:2800]  Intake/Output this shift:  Total I/O In: -  Out: 250 [Urine:250]   Physical Exam:   General: Obese older WM who is alert and oriented.    HEENT: Normal. Pupils equal. .   Lungs: Clear.   Abdomen: Good BS.   Wound: Look good   Neurologic:  Grossly intact to motor and sensory function.     Lab Results:     [facility] 09/12/11 0450  WBC 9.0  HGB 13.3  HCT 39.5  PLT 204    BMET    Basename 09/12/11 0450  NA 133*  K 3.9  CL 96  CO2 26  GLUCOSE 145*  BUN 11  CREATININE 1.06  CALCIUM 8.6    PT/INR   No results found for this basename: LABPROT:2,INR:2 in the last 72 hours  ABG  No results found for this basename: PHART:2,PCO2:2,PO2:2,HCO3:2 in the last 72 hours   Studies/Results:  No results  found.   Anti-infectives:   Anti-infectives     Start     Dose/Rate Route Frequency Ordered Stop   09/11/11 0526   ceFAZolin (ANCEF) IVPB 2 g/50 mL premix        2 g 100 mL/hr over 30 Minutes Intravenous 60 min pre-op 09/11/11 0526 09/11/11 0737          Ovidio Kin, MD, FACS Pager: [phone],   [facility] [place] [facility]: [phone] 09/14/2011

## 2011-09-14 NOTE — Care Management Note (Signed)
    Page 1 of 1   09/14/2011     10:35:06 AM   CARE MANAGEMENT NOTE 09/14/2011  Patient:  [name], [name]   Account Number:  [id number]  Date Initiated:  09/14/2011  Documentation initiated by:  Lorenda Ishihara  Subjective/Objective Assessment:   71 yo male admitted s/p ventral hernia repair. PTA lived at home with spouse.     Action/Plan:   Anticipated DC Date:  09/14/2011   Anticipated DC Plan:  HOME/SELF CARE      DC Planning Services  CM consult      Choice offered to / List presented to:             Status of service:  Completed, signed off Medicare Important Message given?   (If response is "NO", the following Medicare IM given date fields will be blank) Date Medicare IM given:   Date Additional Medicare IM given:    Discharge Disposition:  HOME/SELF CARE  Per UR Regulation:  Reviewed for med. necessity/level of care/duration of stay  If discussed at Long Length of Stay Meetings, dates discussed:    Comments:

## 2011-09-15 NOTE — Op Note (Signed)
NAMEHORATIO, BERTZ NO.:  [id number]  MEDICAL RECORD NO.:  [id number]  LOCATION:  1536                         FACILITY:  [facility]  PHYSICIAN:  [name], M.D.  DATE OF BIRTH:  February 17, 1940  DATE OF PROCEDURE:  09/11/2011 DATE OF DISCHARGE:  09/14/2011                              OPERATIVE REPORT   PREOPERATIVE DIAGNOSIS:  Ventral incisional hernia x2 (upper abdomen and right lower quadrant).  POSTOPERATIVE DIAGNOSES:  Ventral incisional hernias x 2, upper abdomen hernia 4 x 6 cm and right lower quadrant hernia 3 x 5 cm, adhesions undersurface of Parietex mesh, moderate cirrhotic changes of the liver.  PROCEDURE:  Laparoscopic ventral hernia repair x2 with insertion of Parietex mesh 10 x 15 cm for the upper abdominal hernia and 10 x 12 cm Parietex mesh for the right lower quadrant hernia, laparoscopic lysis of adhesions approximately 45 minutes.  SURGEON:  [name], M.D.  FIRST ASSISTANT:  None.  ANESTHESIA:  General endotracheal, supervised by Dr. Lucille Passy.  LOCAL ANESTHETIC:  25 mL of 0.25% Marcaine.  COMPLICATIONS:  None.  INDICATION FOR PROCEDURE:  Mr. Hoselton is a 71 year old white male who sees Dr. Miguel Aschoff as his primary medical doctor who comes with two ventral incisional hernias, one in the upper abdomen and one in the right lower quadrant.  He had a sigmoid colectomy for diverticular disease by Dr. Ovidio Kin in July 2010, developed an abdominal wall hernia, in which I took him to the operating room on January 24, 2010, for repaired a ventral hernia with a 15 x 25 cm piece of Parietex mesh.  He developed a hernia cranial or superior to the prior repair.  He also has an old scar in his right lower quadrant with a hernia through an old appendectomy incision.  I discussed with the patient by repairing both these hernia defects.  I discussed both the indications and potential complications of surgery. Potential complications of  surgery include, but are not limited to, bleeding, infection, bowel injury, and recurrence of the hernias.  His complicating history is that he has a hypercoagulable state.  He is on chronic Coumadin, which we had taken him off of.  He has an inferior vena cava placed in 2002.  He is morbidly obese and diabetic.  OPERATIVE NOTE:  The patient was taken to the operating room on the day of admission.  He was taken to room #1, underwent a general endotracheal anesthesia supervised by Dr. Lucille Passy.  He was given 2 g of Ancef at the initiation of procedure.  His abdomen was prepped with ChloraPrep and draped with Ioban draped.  A time-out was held and the surgical checklist run.  I accessed the abdominal cavity through the left upper quadrant using an 11-mm Ethicon Optiview trocar.  I placed three additional trocars, a left lower quadrant 5-mm trocar, the right upper quadrant 5-mm trocar, and a right mid abdomen 5-mm trocar.  He had a prior repair with Parietex in the mid and lower abdomen.  He had a moderate amount of adhesions which were primarily omentum to the undersurface of this mesh, most of these adhesions swept away well.  I spent about 45 minutes taking these adhesions down, and there appeared to be no injury or damage to the Parietex mesh and he had a good repair from this mesh before.  He had cranial or superior to the old Mesh a hernia defect in the fascia.  He also had a hernia in his right lower quadrant with bowel stuck over this.  It took some time to do enterolysis, was taken this bowel down under a prior appendectomy incision.  After both hernias were exposed, I measured the hernias, the superior or hernia in the upper abdomen measured 4 x 6 cm.  The hernia in the right lower quadrant measured 3 x 5 cm.  I used two separate pieces of Parietex mesh.  A 10 x 15 cm piece of mesh for the upper abdominal hernia and a 10 x 12 cm Parietex mesh for the right lower quadrant  hernia.  I used stay sutures involving 0 Novafil sutures.  I used 4 stay sutures for the right lower quadrant mesh.  I used 6 stay sutures for the upper abdominal mesh.  I then used an absorbable tack to tack this and both meshes in place.  The mesh lay flat at the end of the operation.  Photos were taken and placed in the chart.  The abdomen was irrigated with about 500 mL.  This was primarily for bleeding from the mesial takedown.  The remainder of the abdominal exploration, his stomach was unremarkable.  His pelvis and bowel that I could see was unremarkable. He did however have moderate changes cirrhosis of his liver.  Photos were taken and placed in the chart of this.  The trocars were removed in turn.  I did close his 11-mm trocar site in the left upper quadrant with 2-0 Vicryl sutures.  I closed the skin at each site with a 5-0 Monocryl suture, painted the wounds with Dermabond and Steri-Strips the stab wounds for pulling the retention sutures from the mesh.  I then placed an abdominal binder on him.  He tolerated the procedure well.  He was transported to recovery room in good condition.  Sponge and needle counts were correct at the end of the case.   [name], M.D., FACS   DHN/MEDQ  D:  09/14/2011  T:  09/15/2011  Job:  [id number]  cc:   Miguel Aschoff, M.D.

## 2011-09-15 NOTE — Discharge Summary (Signed)
NAMEDYAMI, UMBACH NO.:  [id number]  MEDICAL RECORD NO.:  [id number]  LOCATION:  1536                         FACILITY:  [facility]  PHYSICIAN:  [name], M.D.  DATE OF BIRTH:  [birth year]  DATE OF ADMISSION:  09/11/2011 DATE OF DISCHARGE:  09/14/2011                              DISCHARGE SUMMARY   DISCHARGE DIAGNOSES: 1. Abdominal ventral incisional hernias x2, upper abdomen and right lower quadrant. 2. History of laparoscopic ventral hernia repair on January 24, 2010. 3. History of colectomy for diverticular disease, July 2011. 4. History of nephrolithiasis. 5. History of stroke in 2002. 6. On chronic Coumadin for hypercoagulable state. 7. Inferior vena caval filter in 2002. 8. Morbid obesity with a BMI of 30.1. 9. Diabetes mellitus. 10.Hypertension. 11.Hypercholesterolemia. 12.Short-term memory difficulties. 13.Postop urinary retention which is now resolved. 14.  Cirrhotic changes in liver seen at laparoscopy.  OPERATIONS PERFORMED:  The patient had a laparoscopic repair of 2 ventral incisional hernias, one in the upper abdomen and  one in the right lower quadrant, enterolysis of adhesions by Dr. Ovidio Kin on September 11, 2011.  HISTORY OF ILLNESS:  Mr. Sime is a 71 year old white male that I have known for the last couple of years.  He developed diverticulitis which required a sigmoid colon resection in July 2011.  He then developed an abdominal wall hernia, and I repaired this hernia with Parietex mesh on January 24, 2010.  He did well with this repair, however, he developed the defect superior to the mesh and he has had a prior appendectomy remotely where he developed a hernia in his right lower quadrant.  He now comes to for this hospitalization repair of the upper abdominal ventral hernia and the right lower quadrant ventral hernia.  PAST MEDICAL HISTORY: 1. Significant that he has a hypercoagulable state.  He has been on     chronic Coumadin.   This was stopped prior to surgery.  His INR     reverted to normal. 2. He has had a history of a stroke in 2002, which left him with short-     term memory problems. 3. He had an inferior vena caval filter placed in 2002, and done well     with this. 4. He is diabetic. 5. He has hypertension. 6. He is mildly obese with a BMI of 30.1. 7. He has hypercholesterolemia.  HOSPITAL COURSE:  On the day of admission, he was taken to the operating room where he underwent a laparoscopic ventral hernia repair x 2 with insertion of mesh, and laparoscopic lysis of adhesions.  He was also noted to have changes in his liver consistent with cirrhosis.  The surgery was done by Dr. Algis Downs. Eliannah Hinde.  He is also noted to have changes of cirrhosis in his liver and I discussed this with him postoperatively.  It may be noted that on his preoperative labs dated August 27, 2011, his only abnormal liver function was an AST which was mildly elevated at 39 with high normal being 37.  His ALT, alk phos, and bilirubin were all within normal limits.  He did develop urinary retention post op and required Foley being placed  on the first postoperative day after 2 catheterizations.  I placed him on Flomax and I left the foely in for 2 days until this morning.  The Foley is now being removed.  He voided since the Foley has come out.  He is afebrile.  He is tolerating regular food.  He is passing flatus, but no bowel movement.  He has no fevers.  Abdominal incision looks good.  He is ready for discharge.  DISCHARGE INSTRUCTIONS:   Activity:  He can start driving in 2-3 days if he is doing well.  He should not do heavy lifting more than 15 pounds for 4 weeks. He can shower as he showered in the hospital.  His diet is as tolerated that will be related to his diabetes. Has should call my office for followup appointment in 2-3 weeks.  His medicine, I am giving him Vicodin for pain.  He can resume his other home medicines.  He can  restart his Coumadin.   He should check with Dr. Tenny Craw' office in 7-10 days for check of anticoagulation levels.  His discharge condition is good.   [name], M.D., FACS   DHN/MEDQ  D:  09/14/2011  T:  09/15/2011  Job:  [id number]  cc:   Dr. Miguel Aschoff  John L. Rendall, M.D. Fax: [phone]

## 2011-09-21 ENCOUNTER — Other Ambulatory Visit: Payer: Self-pay | Admitting: Gastroenterology

## 2011-09-21 DIAGNOSIS — K7460 Unspecified cirrhosis of liver: Secondary | ICD-10-CM

## 2011-09-22 ENCOUNTER — Ambulatory Visit
Admission: RE | Admit: 2011-09-22 | Discharge: 2011-09-22 | Disposition: A | Discharge status: Home / Self Care | Payer: Medicare Other | Source: Ambulatory Visit | Attending: Gastroenterology | Admitting: Gastroenterology

## 2011-09-22 DIAGNOSIS — K7460 Unspecified cirrhosis of liver: Secondary | ICD-10-CM

## 2011-10-08 ENCOUNTER — Encounter (INDEPENDENT_AMBULATORY_CARE_PROVIDER_SITE_OTHER): Payer: Self-pay | Admitting: Surgery

## 2011-10-08 ENCOUNTER — Ambulatory Visit (INDEPENDENT_AMBULATORY_CARE_PROVIDER_SITE_OTHER): Payer: Medicare Other | Admitting: Surgery

## 2011-10-08 VITALS — BP 128/80 | HR 68 | Temp 97.7°F | Resp 18 | Ht 64.0 in | Wt 176.6 lb

## 2011-10-08 DIAGNOSIS — K432 Incisional hernia without obstruction or gangrene: Secondary | ICD-10-CM

## 2011-10-08 NOTE — Progress Notes (Signed)
Re:   [name] DOB:   01-Mar-1940 MRN:   [id number]  ASSESSMENT AND PLAN: 1.  Abdominal wall hernia x 2- superior to midline incision and right lower quadrant through old appendectomy/ureterotomy incision.  Laparoscopic repair x 2 - 09/11/2011.  He has done well.   In good spirits.  Return PRN.  2.  Repair of ventral hernia - 01/24/2010  Parietex mesh. (15 x 25 cm) 3.  History of colectomy for diverticular disease - July 2011 4.  History of nephrolithiasis 5.  History of stroke - 2002  6.  On chronic coumadin for hypercoagulable state. 7.  Inferior vena caval filter - 2002 (done at the same time he had his stroke) 8.  Morbid obesity - BMI 30.1 9.  Diabetes Mellitus 10.  Hypertension.  11.  Hypercholesterolemia. 12.  Short term memory difficulties. 13.  Patient had cirrhotic changes in liver seen at laparoscopy.    Has seen Dr. Greggory Harmon.  Chief Complaint  Patient presents with  . Follow-up    vent. herina   REFERRING PHYSICIAN: [name], MD  HISTORY OF PRESENT ILLNESS: [name] is a 71 y.o. (DOB: 09/06/1940)  white male whose primary care physician is Dustin Floro, MD and comes to me today for follow up of abdominal wall hernia repair x 2 on 09/11/2011.  Doing well. Asked about some bulging in his lower abdomen, but this is a mild pannus that he has.  Otherwise he is in good spirits and doing well.   Past Medical History  Diagnosis Date  . Hypertension   . Diabetes mellitus   . Presence of inferior vena cava filter 2002  . Hyperlipidemia   . Stroke 2002    right eye no peripheral vision, short term memory loss  . Kidney stones     hx of     Current Outpatient Prescriptions  Medication Sig Dispense Refill  . glipiZIDE (GLUCOTROL) 5 MG tablet Take 5 mg by mouth every morning.      . hydrochlorothiazide (HYDRODIURIL) 12.5 MG tablet Take 12.5 mg by mouth daily.      Marland Kitchen losartan (COZAAR) 100 MG tablet Take 100 mg by mouth every morning.       .  metFORMIN (GLUCOPHAGE) 500 MG tablet Take 1,000 mg by mouth 2 (two) times daily.       . simvastatin (ZOCOR) 40 MG tablet Take 40 mg by mouth every evening.      . warfarin (COUMADIN) 2 MG tablet Take 3-4 mg by mouth daily. Patient alternates taking 1 and 1/2 tablets on one day and two tablets on the next day.          Allergies  Allergen Reactions  . Zithromax (Azithromycin) Other (See Comments)    Weak and flulike symptoms    REVIEW OF SYSTEMS: Neurologic: History of stroke in 2002. Cardiac:  History of hypertension. Endocrine:  Diabetes mellitus - stable now.  Hypercholesterolemia. Gastrointestinal:  No history of stomach disease.  No history of liver disease.  No history of gall bladder disease.  No history of pancreas disease.  History of colectomy for diverticular disease - July 2011.  Appendectomy at age 30 - then kidney stone extraction through the same incision - then Dr. Zachery Harmon tried to repair hernia. Urologic:  History of nephrolithiasis.  Followed by Dr. Ranelle Harmon. Hematologic: Hypercoagulable.  On coumadin.  IVC filter about 2002. Psycho-social:  He has some memory problems.  SOCIAL and FAMILY HISTORY: Single. But has girl  friend, [name], who checks on him regularly. 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  PHYSICAL EXAM: BP 128/80  Pulse 68  Temp 97.7 F (36.5 C) (Oral)  Resp 18  Ht 5\' 4"  (1.626 m)  Wt 176 lb 9.6 oz (80.105 kg)  BMI 30.31 kg/m2  General: WN mildly obese WM who is alert and generally healthy appearing.  Abdomen: Soft. No mass.  Wounds look good.  BS present.  No localized tenderness.  DATA REVIEWED: None new.  Dustin Kin, MD,  [facility] [address] [place]  [address]   [place], [place]    [address] Phone:  [phone] FAX:  [phone]

## 2016-04-27 ENCOUNTER — Ambulatory Visit: Payer: Self-pay | Admitting: Podiatry

## 2016-05-05 DIAGNOSIS — S32010A Wedge compression fracture of first lumbar vertebra, initial encounter for closed fracture: Secondary | ICD-10-CM

## 2016-05-05 HISTORY — DX: Wedge compression fracture of first lumbar vertebra, initial encounter for closed fracture: S32.010A

## 2016-05-28 ENCOUNTER — Observation Stay (HOSPITAL_COMMUNITY)
Admission: EM | Admit: 2016-05-28 | Discharge: 2016-06-01 | Disposition: A | Discharge status: Home / Self Care | Payer: Medicare Other | Attending: Internal Medicine | Admitting: Internal Medicine

## 2016-05-28 ENCOUNTER — Emergency Department (HOSPITAL_COMMUNITY): Payer: Medicare Other

## 2016-05-28 ENCOUNTER — Encounter (HOSPITAL_COMMUNITY): Payer: Self-pay

## 2016-05-28 DIAGNOSIS — I10 Essential (primary) hypertension: Secondary | ICD-10-CM

## 2016-05-28 DIAGNOSIS — W19XXXA Unspecified fall, initial encounter: Secondary | ICD-10-CM

## 2016-05-28 DIAGNOSIS — W11XXXA Fall on and from ladder, initial encounter: Secondary | ICD-10-CM | POA: Insufficient documentation

## 2016-05-28 DIAGNOSIS — Z7984 Long term (current) use of oral hypoglycemic drugs: Secondary | ICD-10-CM | POA: Insufficient documentation

## 2016-05-28 DIAGNOSIS — R935 Abnormal findings on diagnostic imaging of other abdominal regions, including retroperitoneum: Secondary | ICD-10-CM

## 2016-05-28 DIAGNOSIS — M545 Low back pain: Secondary | ICD-10-CM | POA: Diagnosis present

## 2016-05-28 DIAGNOSIS — K7460 Unspecified cirrhosis of liver: Secondary | ICD-10-CM | POA: Diagnosis not present

## 2016-05-28 DIAGNOSIS — Z8673 Personal history of transient ischemic attack (TIA), and cerebral infarction without residual deficits: Secondary | ICD-10-CM | POA: Insufficient documentation

## 2016-05-28 DIAGNOSIS — D649 Anemia, unspecified: Secondary | ICD-10-CM | POA: Insufficient documentation

## 2016-05-28 DIAGNOSIS — Z87442 Personal history of urinary calculi: Secondary | ICD-10-CM | POA: Diagnosis not present

## 2016-05-28 DIAGNOSIS — D696 Thrombocytopenia, unspecified: Secondary | ICD-10-CM | POA: Diagnosis not present

## 2016-05-28 DIAGNOSIS — K8689 Other specified diseases of pancreas: Secondary | ICD-10-CM | POA: Insufficient documentation

## 2016-05-28 DIAGNOSIS — Z7901 Long term (current) use of anticoagulants: Secondary | ICD-10-CM | POA: Insufficient documentation

## 2016-05-28 DIAGNOSIS — Z79899 Other long term (current) drug therapy: Secondary | ICD-10-CM | POA: Insufficient documentation

## 2016-05-28 DIAGNOSIS — Z66 Do not resuscitate: Secondary | ICD-10-CM | POA: Diagnosis not present

## 2016-05-28 DIAGNOSIS — S32000A Wedge compression fracture of unspecified lumbar vertebra, initial encounter for closed fracture: Secondary | ICD-10-CM | POA: Diagnosis present

## 2016-05-28 DIAGNOSIS — S32010A Wedge compression fracture of first lumbar vertebra, initial encounter for closed fracture: Secondary | ICD-10-CM

## 2016-05-28 DIAGNOSIS — I4891 Unspecified atrial fibrillation: Secondary | ICD-10-CM | POA: Diagnosis not present

## 2016-05-28 DIAGNOSIS — E119 Type 2 diabetes mellitus without complications: Secondary | ICD-10-CM | POA: Insufficient documentation

## 2016-05-28 DIAGNOSIS — E785 Hyperlipidemia, unspecified: Secondary | ICD-10-CM | POA: Diagnosis not present

## 2016-05-28 DIAGNOSIS — S32019A Unspecified fracture of first lumbar vertebra, initial encounter for closed fracture: Secondary | ICD-10-CM | POA: Diagnosis not present

## 2016-05-28 DIAGNOSIS — R160 Hepatomegaly, not elsewhere classified: Secondary | ICD-10-CM | POA: Insufficient documentation

## 2016-05-28 HISTORY — DX: Wedge compression fracture of first lumbar vertebra, initial encounter for closed fracture: S32.010A

## 2016-05-28 LAB — I-STAT CHEM 8, ED
BUN: 15 mg/dL (ref 6–20)
CALCIUM ION: 1.14 mmol/L — AB (ref 1.15–1.40)
Chloride: 105 mmol/L (ref 101–111)
Creatinine, Ser: 0.8 mg/dL (ref 0.61–1.24)
Glucose, Bld: 203 mg/dL — ABNORMAL HIGH (ref 65–99)
HEMATOCRIT: 40 % (ref 39.0–52.0)
Hemoglobin: 13.6 g/dL (ref 13.0–17.0)
Potassium: 4.2 mmol/L (ref 3.5–5.1)
Sodium: 137 mmol/L (ref 135–145)
TCO2: 22 mmol/L (ref 0–100)

## 2016-05-28 LAB — BASIC METABOLIC PANEL
BUN: 15 mg/dL (ref 4–21)
Creatinine: 0.8 mg/dL (ref 0.6–1.3)
GLUCOSE: 203 mg/dL
POTASSIUM: 4.2 mmol/L (ref 3.4–5.3)
SODIUM: 137 mmol/L (ref 137–147)

## 2016-05-28 LAB — URINALYSIS, ROUTINE W REFLEX MICROSCOPIC
BILIRUBIN URINE: NEGATIVE
Glucose, UA: 50 mg/dL — AB
Ketones, ur: 5 mg/dL — AB
NITRITE: NEGATIVE
PH: 5 (ref 5.0–8.0)
Protein, ur: NEGATIVE mg/dL
SPECIFIC GRAVITY, URINE: 1.015 (ref 1.005–1.030)
Squamous Epithelial / LPF: NONE SEEN

## 2016-05-28 LAB — PROTIME-INR
INR: 1.75
PROTHROMBIN TIME: 20.7 s — AB (ref 11.4–15.2)

## 2016-05-28 LAB — CBC AND DIFFERENTIAL
HCT: 40 % — AB (ref 41–53)
Hemoglobin: 13.6 g/dL (ref 13.5–17.5)

## 2016-05-28 LAB — GLUCOSE, CAPILLARY
Glucose-Capillary: 173 mg/dL — ABNORMAL HIGH (ref 65–99)
Glucose-Capillary: 120 mg/dL — ABNORMAL HIGH (ref 65–99)

## 2016-05-28 MED ORDER — HYDROMORPHONE HCL 1 MG/ML IJ SOLN
1.0000 mg | Freq: Once | INTRAMUSCULAR | Status: AC
  Administered 2016-05-28: 1 mg via INTRAVENOUS
  Filled 2016-05-28: qty 1

## 2016-05-28 MED ORDER — INSULIN ASPART 100 UNIT/ML ~~LOC~~ SOLN
0.0000 [IU] | Freq: Three times a day (TID) | SUBCUTANEOUS | Status: DC
  Administered 2016-05-28 – 2016-05-29 (×2): 3 [IU] via SUBCUTANEOUS
  Administered 2016-05-29 – 2016-05-30 (×5): 2 [IU] via SUBCUTANEOUS
  Administered 2016-05-31: 3 [IU] via SUBCUTANEOUS
  Administered 2016-06-01: 2 [IU] via SUBCUTANEOUS
  Administered 2016-06-01: 3 [IU] via SUBCUTANEOUS

## 2016-05-28 MED ORDER — HYDRALAZINE HCL 20 MG/ML IJ SOLN: 10.0000 mg | Freq: Three times a day (TID) | INTRAMUSCULAR | Status: DC | PRN

## 2016-05-28 MED ORDER — POLYETHYLENE GLYCOL 3350 17 G PO PACK
17.0000 g | PACK | Freq: Every day | ORAL | Status: DC | PRN
Start: 2016-05-28 — End: 2016-06-01
  Administered 2016-06-01: 17 g via ORAL
  Filled 2016-05-28: qty 1

## 2016-05-28 MED ORDER — HYDROMORPHONE HCL 1 MG/ML IJ SOLN
1.0000 mg | INTRAMUSCULAR | Status: DC | PRN
  Administered 2016-05-28 – 2016-05-31 (×10): 1 mg via INTRAVENOUS
  Filled 2016-05-28 (×10): qty 1

## 2016-05-28 MED ORDER — ACETAMINOPHEN 650 MG RE SUPP: 650.0000 mg | Freq: Four times a day (QID) | RECTAL | Status: DC | PRN

## 2016-05-28 MED ORDER — IOPAMIDOL (ISOVUE-300) INJECTION 61%
INTRAVENOUS | Status: AC
  Administered 2016-05-28: 100 mL via INTRAVENOUS
  Filled 2016-05-28: qty 100

## 2016-05-28 MED ORDER — MORPHINE SULFATE (PF) 4 MG/ML IV SOLN
4.0000 mg | Freq: Once | INTRAVENOUS | Status: AC
  Administered 2016-05-28: 4 mg via INTRAVENOUS
  Filled 2016-05-28: qty 1

## 2016-05-28 MED ORDER — ONDANSETRON HCL 4 MG/2ML IJ SOLN
4.0000 mg | Freq: Once | INTRAMUSCULAR | Status: AC
  Administered 2016-05-28: 4 mg via INTRAVENOUS
  Filled 2016-05-28: qty 2

## 2016-05-28 MED ORDER — SODIUM CHLORIDE 0.9 % IV SOLN
INTRAVENOUS | Status: AC
  Administered 2016-05-28: 16:00:00 via INTRAVENOUS

## 2016-05-28 MED ORDER — SIMVASTATIN 40 MG PO TABS
40.0000 mg | ORAL_TABLET | Freq: Every evening | ORAL | Status: DC
  Administered 2016-05-28 – 2016-05-31 (×4): 40 mg via ORAL
  Filled 2016-05-28 (×4): qty 1

## 2016-05-28 MED ORDER — ACETAMINOPHEN 325 MG PO TABS
650.0000 mg | ORAL_TABLET | Freq: Four times a day (QID) | ORAL | Status: DC | PRN
  Administered 2016-06-01: 650 mg via ORAL
  Filled 2016-05-28: qty 2

## 2016-05-28 MED ORDER — ONDANSETRON HCL 4 MG PO TABS: 4.0000 mg | ORAL_TABLET | Freq: Four times a day (QID) | ORAL | Status: DC | PRN

## 2016-05-28 MED ORDER — LOSARTAN POTASSIUM 50 MG PO TABS
100.0000 mg | ORAL_TABLET | Freq: Every morning | ORAL | Status: DC
Start: 2016-05-29 — End: 2016-06-01
  Administered 2016-05-29 – 2016-06-01 (×4): 100 mg via ORAL
  Filled 2016-05-28 (×5): qty 2

## 2016-05-28 MED ORDER — ONDANSETRON HCL 4 MG/2ML IJ SOLN: 4.0000 mg | Freq: Four times a day (QID) | INTRAMUSCULAR | Status: DC | PRN

## 2016-05-28 NOTE — ED Notes (Signed)
Pt ambulated approximately 245ft with 2 assist. Pt c/o extreme pain in lower back while ambulating. Pt returned back to stretcher and call bell within in reach.

## 2016-05-28 NOTE — ED Notes (Signed)
Patient transported to X-ray 

## 2016-05-28 NOTE — H&P (Signed)
[facility] History and Physical  [name] JWJ:[id number] DOB: [birth year] DOA: 05/28/2016  Referring physician:  PCP: [name], MD   Chief Complaint: "I lost my footing on a little two step ladder."  HPI: [name] is a 76 y.o. male  with past medical history of diabetes, hyperlipidemia, hypertension, kidney stones, stroke since to the emergency room after falling off a ladder. Patient states he was well on the days leading up to admission. States felt well this morning. Was lifting a oversized mirror while standing atop a two-step ladder. Patient lost his balance but managed to throw the mirroe to the side mid air. Landed flat on his back. EMS activated. Came to ED.  ED course: CT scan revealed vertebral body compression fracture. Patient was given at dose of morphine and Dilaudid and still was unable to ambulate secondary to pain. EDP call requesting admission for pain control and physical therapy. In addition, patient feels unsafe going home with his current state.  Review of Systems:  As per HPI otherwise 10 point review of systems negative.    Past Medical History:  Diagnosis Date  . Diabetes mellitus   . Hyperlipidemia   . Hypertension   . Kidney stones    hx of  . Presence of inferior vena cava filter 2002  . Stroke [facility]) 2002   right eye no peripheral vision, short term memory loss   Past Surgical History:  Procedure Laterality Date  . APPENDECTOMY  1949  . BACK SURGERY    . CARPAL TUNNEL RELEASE  2002 both wrists  . COLON SURGERY  07/19/2009   sigmoid colectomy   . HERNIA REPAIR   1983, 1983, 02/03/2010   lap ventral hernia repair  . KIDNEY STONE SURGERY  1977, 1983  . LITHOTRIPSY  2010   bleeding in kidney after  . surgery for diverticulosis  2011  . TONSILLECTOMY  1947  . TONSILLECTOMY AND ADENOIDECTOMY    . VENTRAL HERNIA REPAIR  09/11/2011   Procedure: LAPAROSCOPIC VENTRAL HERNIA;  Surgeon: Kandis Cocking, MD;  Location: [facility] ORS;   Service: General;  Laterality: N/A;  Lapaaroscopic Repair Ventral Incisional Hernias x 2   Social History:  reports that he has never smoked. He has never used smokeless tobacco. He reports that he does not drink alcohol or use drugs.  Allergies  Allergen Reactions  . Zithromax [Azithromycin] Other (See Comments)    Weak and flulike symptoms    No family history on file.   Prior to Admission medications   Medication Sig Start Date End Date Taking? Authorizing Provider  glipiZIDE (GLUCOTROL) 5 MG tablet Take 5 mg by mouth every morning.    [provider]  hydrochlorothiazide (HYDRODIURIL) 12.5 MG tablet Take 12.5 mg by mouth daily.    [provider]  losartan (COZAAR) 100 MG tablet Take 100 mg by mouth every morning.     [provider]  metFORMIN (GLUCOPHAGE) 500 MG tablet Take 1,000 mg by mouth 2 (two) times daily.     [provider]  simvastatin (ZOCOR) 40 MG tablet Take 40 mg by mouth every evening.    [provider]  warfarin (COUMADIN) 2 MG tablet Take 3-4 mg by mouth daily. Patient alternates taking 1 and 1/2 tablets on one day and two tablets on the next day.    [provider]   Physical Exam: Vitals:   05/28/16 1400 05/28/16 1411 05/28/16 1415 05/28/16 1430  BP: 135/81 135/81 (!) 146/82 132/80  Pulse: 96 97 90 93  Resp: (!) 22 18 (!) 25 (!) 24  Temp:      TempSrc:      SpO2: 94% 96% 91% 91%  Weight:      Height:        Wt Readings from Last 3 Encounters:  05/28/16 79.4 kg (175 lb)  10/08/11 80.1 kg (176 lb 9.6 oz)  09/11/11 81.6 kg (180 lb)    General:  Appears calm and comfortable Eyes:  PERRL, EOMI, normal lids, iris ENT:  grossly normal hearing, lips & tongue Neck:  no LAD, masses or thyromegaly Cardiovascular:  RRR, no m/r/g. No LE edema.  Respiratory:  CTA bilaterally, no w/r/r. Normal respiratory effort. Abdomen:  soft, ntnd Skin:  no rash or induration seen on limited exam Musculoskeletal:   grossly normal tone BUE/BLE Psychiatric:  grossly normal mood and affect, speech fluent and appropriate Neurologic:  CN 2-12 grossly intact, moves all extremities in coordinated fashion.          Labs on Admission:  Basic Metabolic Panel:  Recent Labs Lab 05/28/16 1059  NA 137  K 4.2  CL 105  GLUCOSE 203*  BUN 15  CREATININE 0.80   Liver Function Tests: No results for input(s): AST, ALT, ALKPHOS, BILITOT, PROT, ALBUMIN in the last 168 hours. No results for input(s): LIPASE, AMYLASE in the last 168 hours. No results for input(s): AMMONIA in the last 168 hours. CBC:  Recent Labs Lab 05/28/16 1059  HGB 13.6  HCT 40.0   Cardiac Enzymes: No results for input(s): CKTOTAL, CKMB, CKMBINDEX, TROPONINI in the last 168 hours.  BNP (last 3 results) No results for input(s): BNP in the last 8760 hours.  ProBNP (last 3 results) No results for input(s): PROBNP in the last 8760 hours.   Serum creatinine: 0.8 mg/dL 85/88/50 2774 Estimated creatinine clearance: 77.1 mL/min  CBG: No results for input(s): GLUCAP in the last 168 hours.  Radiological Exams on Admission: Dg Chest 2 View  Result Date: 05/28/2016 CLINICAL DATA:  76 year old male with a history of fall from ladder EXAM: CHEST  2 VIEW COMPARISON:  08/27/2011, 01/31/2010 FINDINGS: Cardiomediastinal silhouette unchanged in size and contour. Calcifications of the aortic arch. No pneumothorax. No pleural effusion. Similar appearance of coarsened interstitial markings when compared to prior plain films. No confluent airspace disease. Lateral view demonstrates new compression fracture of L1 which was not present on a remote CT dated 06/13/2009. Multilevel degenerative changes of the visualized spine. IMPRESSION: Low lung volumes and chronic lung changes without evidence of superimposed acute cardiopulmonary disease. Lateral view of the chest incidentally image is a new compression fracture of L1. Comparison study is dated 06/13/2009.  Chronicity is indeterminate, and if there is concern for acute pain at this level and possibility of acute fracture, plain film series may be useful. Aortic atherosclerosis. Electronically Signed   By: Gilmer Mor D.O.   On: 05/28/2016 11:40   Dg Ankle Complete Right  Result Date: 05/28/2016 CLINICAL DATA:  Fall from ladder with right ankle pain, initial encounter EXAM: RIGHT ANKLE - COMPLETE 3+ VIEW COMPARISON:  None. FINDINGS: There is no evidence of fracture, dislocation, or joint effusion. There is no evidence of arthropathy or other focal bone abnormality. Soft tissues are unremarkable. IMPRESSION: No acute abnormality noted. Electronically Signed   By: Alcide Clever M.D.   On: 05/28/2016 11:38   Ct Abdomen Pelvis W Contrast  Result Date: 05/28/2016 CLINICAL DATA:  Fall from ladder this morning. Low back pain.  History of ventral hernia repair. EXAM: CT ABDOMEN AND PELVIS WITH CONTRAST TECHNIQUE: Multidetector CT imaging of the abdomen and pelvis was performed using the standard protocol following bolus administration of intravenous contrast. CONTRAST:  100mL ISOVUE-300 IOPAMIDOL (ISOVUE-300) INJECTION 61% COMPARISON:  01/16/2016 abdominal radiograph. 07/12/2009 CT abdomen/pelvis. FINDINGS: Lower chest: No significant pulmonary nodules or acute consolidative airspace disease. Coronary atherosclerosis. Hepatobiliary: Liver surface is diffusely irregular with relative hypertrophy of the lateral segment left liver and caudate lobes, compatible cirrhosis. Peripheral segment 7 right liver lobe 2.8 x 2.4 cm hypoenhancing mass (series 3/ image 21). subcentimeter hypodense lateral segment left liver lobe lesion. No additional liver lesions. No liver laceration. Normal gallbladder with no radiopaque cholelithiasis. No biliary ductal dilatation. Pancreas: Heterogeneous fatty infiltration of the pancreas. Cystic 1.2 cm pancreatic tail lesion (series 3/image 28). No additional pancreatic lesions. No pancreatic duct  dilation. Scattered calcifications throughout the pancreas are favored to represent vascular calcifications. Spleen: Normal size. No laceration or mass. Adrenals/Urinary Tract: Normal adrenals. No hydronephrosis. No renal laceration. Indeterminate hypodense partially calcified 2.6 x 1.8 cm posterior interpolar left renal lesion (series 3/ image 42). Simple 3.4 cm anterior lower left renal cyst. Nonobstructing 9 mm interpolar and 9 mm lower right renal stones. Nonobstructing 11 mm lower left renal stone. Normal bladder. Stomach/Bowel: Grossly normal stomach. Normal caliber small bowel with no small bowel wall thickening. Appendectomy. Normal large bowel with no diverticulosis, large bowel wall thickening or pericolonic fat stranding. Vascular/Lymphatic: Atherosclerotic nonaneurysmal abdominal aorta. IVC filter is in place below the level of the renal veins. Patent portal, splenic and renal veins. Small gastroesophageal and paraumbilical varices. No pathologically enlarged lymph nodes in the abdomen or pelvis. Reproductive: Moderately enlarged prostate with nonspecific internal prostatic calcifications. Other: No pneumoperitoneum, ascites or focal fluid collection. No evidence of a recurrent ventral abdominal hernia status post mesh repair. Musculoskeletal: No aggressive appearing focal osseous lesions. Mild compression fracture of the superior L1 vertebral body with minimal 20% loss of vertebral body height, probably acute. No additional fracture. Moderate thoracolumbar spondylosis. IMPRESSION: 1. Mild L1 vertebral compression fracture, probably acute. No additional acute traumatic injury in the abdomen or pelvis. 2. Cirrhosis. Hypoenhancing 2.8 cm segment 7 right liver lobe mass, cannot exclude hepatocellular carcinoma. MRI abdomen without and with IV contrast recommended on a short term outpatient basis for further characterization. 3. Small gastroesophageal and paraumbilical varices. No ascites. Normal size  spleen. 4. Cystic 1.2 cm pancreatic tail lesion, which can also be further characterized at MRI abdomen without and with IV contrast. 5. Indeterminate low-attenuation posterior interpolar left renal mass, which can also be further characterized at MRI abdomen without and with IV contrast. 6. Additional findings include aortic atherosclerosis, coronary atherosclerosis, nonobstructing bilateral nephrolithiasis and moderate prostatomegaly. Electronically Signed   By: Delbert PhenixJason A Poff M.D.   On: 05/28/2016 12:25    EKG: pending  Assessment/Plan Principal Problem:   Closed compression fracture of L1 lumbar vertebra (HCC) Active Problems:   HTN (hypertension)   DM II (diabetes mellitus, type II), controlled (HCC)   L1 compression fracture Physical therapy Back brace IV pain control to be weaned to by mouth pain control CT head and neck due to fall causing fracture as it could be distracting injury  Hypertension When necessary hydralazine 10 mg IV as needed for severe blood pressure Cont cozaar  DM SSI ACHS Hold glipizide, actos  Hyperlipidemia Continue statin   Code Status: FULL  DVT Prophylaxis: SCD, left Family Communication: pt asked MD not to call anyone Disposition  Plan: Pending Improvement  Status: obs tele  Haydee Salter, MD Family Medicine [facility] [email or web address] Password [email or web address]

## 2016-05-28 NOTE — ED Notes (Signed)
Ortho tech called and notified about TLSO brace

## 2016-05-28 NOTE — Progress Notes (Signed)
Orthopedic Tech Progress Note Patient Details:  [name] [birth year] [id number] Called [employer] for brace. Patient ID: [name], male   DOB: [birth year], 76 y.o.   MRN: [id number]   [name], [name] Craig 05/28/2016, 2:54 PM

## 2016-05-28 NOTE — ED Triage Notes (Signed)
Pt brought in by EMS due to falling while trying to hang mirror. Per EMS, pt was standing on a ledge and fell backwards on to his back. Pt denies hitting head, LOC, or neck pain. Pt c/o lower back pain. Pt has hx of CVA. Pt is on coumadin. Pt is a&ox4.

## 2016-05-28 NOTE — Progress Notes (Addendum)
ANTICOAGULATION CONSULT NOTE  Pharmacy Consult for warfarin Indication: atrial fibrillation   Assessment: [age] yof with afib and CVA hx on warfarin PTA admitted s/p fall from ladder. Pharmacy consulted to dose warfarin inpatient. INR subtherapeutic on admit at 1.75. CBC wnl. No bleed documented. CT head and cervical spine pending.  PTA warfarin dose: 3mg  daily (last dose 5/23 PTA)  Goal of Therapy:  INR 2-3 Monitor platelets by anticoagulation protocol: Yes   Plan:  Warfarin 4.5mg  PO x 1 dose tonight (if scans negative for bleed) Daily INR Monitor CBC, s/sx bleeding  Babs BertinHaley Hersh Minney, PharmD, BCPS Clinical Pharmacist 05/28/2016 5:49 PM

## 2016-05-28 NOTE — ED Provider Notes (Signed)
[facility]EM[facility]ENCY DEPT Provider Note   CSN: [id number] Arrival date & time: 05/28/16  [id number]     History   Chief Complaint Chief Complaint  Patient presents with  . Fall    HPI Lilyan GilfordJackie L Gingrich is a 76 y.o. male.  HPI 76 year old Caucasian male past medical history significant for diabetes, hypertension, CVA and currently on Coumadin presents to the emergency department today after sustaining a mechanical fall prior to arrival. Patient was brought in by EMS. The patient states that he was trying to hang demeanor was standing on a approximately 2-3 foot stool when he fell backwards landing on his back. The patient denies hitting his head or LOC. He complains of back pain. Movement makes the pain worse. Has not tried anything for his pain prior to arrival. Pt was unable to get himself up. Patient states he has a history of back pain with surgical intervention. Patient denies any bowel or bladder incontinence, urinary retention, saddle paresthesias. Patient denies any headache, vision changes, lightheadedness, dizziness, chest pain, shortness of breath, abdominal pain, nausea, emesis, urinary symptoms, change in bowel habits, lower extremity paresthesias. Past Medical History:  Diagnosis Date  . Diabetes mellitus   . Hyperlipidemia   . Hypertension   . Kidney stones    hx of  . Presence of inferior vena cava filter 2002  . Stroke [facility] 2002   right eye no peripheral vision, short term memory loss    Patient Active Problem List   Diagnosis Date Noted  . Incisional hernia x 2.  Repaired 9/60/2013. 08/21/2011    Past Surgical History:  Procedure Laterality Date  . APPENDECTOMY  1949  . BACK SURGERY    . CARPAL TUNNEL RELEASE  2002 both wrists  . COLON SURGERY  07/19/2009   sigmoid colectomy   . HERNIA REPAIR   1983, 1983, 02/03/2010   lap ventral hernia repair  . KIDNEY STONE SURGERY  1977, 1983  . LITHOTRIPSY  2010   bleeding in kidney after  . surgery for diverticulosis  2011  .  TONSILLECTOMY  1947  . TONSILLECTOMY AND ADENOIDECTOMY    . VENTRAL HERNIA REPAIR  09/11/2011   Procedure: LAPAROSCOPIC VENTRAL HERNIA;  Surgeon: Kandis Cockingavid H Newman, MD;  Location: [facility] ORS;  Service: General;  Laterality: N/A;  Lapaaroscopic Repair Ventral Incisional Hernias x 2       Home Medications    Prior to Admission medications   Medication Sig Start Date End Date Taking? Authorizing Provider  glipiZIDE (GLUCOTROL) 5 MG tablet Take 5 mg by mouth every morning.    [provider]  hydrochlorothiazide (HYDRODIURIL) 12.5 MG tablet Take 12.5 mg by mouth daily.    [provider]  losartan (COZAAR) 100 MG tablet Take 100 mg by mouth every morning.     [provider]  metFORMIN (GLUCOPHAGE) 500 MG tablet Take 1,000 mg by mouth 2 (two) times daily.     [provider]  simvastatin (ZOCOR) 40 MG tablet Take 40 mg by mouth every evening.    [provider]  warfarin (COUMADIN) 2 MG tablet Take 3-4 mg by mouth daily. Patient alternates taking 1 and 1/2 tablets on one day and two tablets on the next day.    [provider]    Family History No family history on file.  Social History Social History  Substance Use Topics  . Smoking status: Never Smoker  . Smokeless tobacco: Never Used  . Alcohol use No     Allergies  Zithromax [azithromycin]   Review of Systems Review of Systems  Constitutional: Negative for chills and fever.  HENT: Negative for congestion.   Eyes: Negative for visual disturbance.  Respiratory: Negative for cough and shortness of breath.   Cardiovascular: Negative for chest pain.  Gastrointestinal: Negative for abdominal pain, diarrhea, nausea and vomiting.  Musculoskeletal: Positive for back pain. Negative for neck pain and neck stiffness.  Skin: Positive for color change.  Neurological: Negative for dizziness, syncope, weakness, light-headedness, numbness and headaches.     Physical Exam Updated Vital  Signs BP 133/75 (BP Location: Right Arm)   Pulse 88   Temp 97.7 F (36.5 C) (Oral)   Resp (!) 24   Ht 5\' 4"  (1.626 m)   Wt 79.4 kg (175 lb)   SpO2 96%   BMI 30.04 kg/m   Physical Exam  Constitutional: He is oriented to person, place, and time. He appears well-developed and well-nourished. No distress.  HENT:  Head: Normocephalic and atraumatic.  Mouth/Throat: Oropharynx is clear and moist.  Eyes: Conjunctivae and EOM are normal. Pupils are equal, round, and reactive to light. Right eye exhibits no discharge. Left eye exhibits no discharge. No scleral icterus.  Neck: Normal range of motion. Neck supple. No thyromegaly present.  Cardiovascular: Normal rate, regular rhythm, normal heart sounds and intact distal pulses.   Pulmonary/Chest: Effort normal and breath sounds normal.  Abdominal: Soft. Bowel sounds are normal. He exhibits no distension. There is generalized tenderness. There is no rigidity, no rebound, no guarding and no CVA tenderness.  No ecchymosis   Musculoskeletal: Normal range of motion. He exhibits no edema, tenderness or deformity.  Midline T-spine and L-spine tenderness. No deformity or step-offs noted. Paraspinal tenderness noted. Patient moving all 4 extremities without any difficulties. Radial and DP pulses are 2+ bilaterally in all extremities.  Pelvis is stable.  Lymphadenopathy:    He has no cervical adenopathy.  Neurological: He is alert and oriented to person, place, and time.  The patient is alert, attentive, and oriented x 3. Speech is clear. Cranial nerve II-VII grossly intact. Negative pronator drift. Sensation intact. Strength 5/5 in all extremities. Reflexes 2+ and symmetric at biceps, triceps, knees, and ankles. Rapid alternating movement and fine finger movements intact. Did not access romberg and gait due to back pain   Skin: Skin is warm and dry. Capillary refill takes less than 2 seconds.  Nursing note and vitals reviewed.    ED Treatments /  Results  Labs (all labs ordered are listed, but only abnormal results are displayed) Labs Reviewed  PROTIME-INR - Abnormal; Notable for the following:       Result Value   Prothrombin Time 20.7 (*)    All other components within normal limits  URINALYSIS, ROUTINE W REFLEX MICROSCOPIC - Abnormal; Notable for the following:    APPearance HAZY (*)    Glucose, UA 50 (*)    Hgb urine dipstick LARGE (*)    Ketones, ur 5 (*)    Leukocytes, UA MODERATE (*)    Bacteria, UA RARE (*)    All other components within normal limits  I-STAT CHEM 8, ED - Abnormal; Notable for the following:    Glucose, Bld 203 (*)    Calcium, Ion 1.14 (*)    All other components within normal limits    EKG  EKG Interpretation None       Radiology Dg Chest 2 View  Result Date: 05/28/2016 CLINICAL DATA:  76 year old male with a history of fall  from ladder EXAM: CHEST  2 VIEW COMPARISON:  08/27/2011, 01/31/2010 FINDINGS: Cardiomediastinal silhouette unchanged in size and contour. Calcifications of the aortic arch. No pneumothorax. No pleural effusion. Similar appearance of coarsened interstitial markings when compared to prior plain films. No confluent airspace disease. Lateral view demonstrates new compression fracture of L1 which was not present on a remote CT dated 06/13/2009. Multilevel degenerative changes of the visualized spine. IMPRESSION: Low lung volumes and chronic lung changes without evidence of superimposed acute cardiopulmonary disease. Lateral view of the chest incidentally image is a new compression fracture of L1. Comparison study is dated 06/13/2009. Chronicity is indeterminate, and if there is concern for acute pain at this level and possibility of acute fracture, plain film series may be useful. Aortic atherosclerosis. Electronically Signed   By: Gilmer Mor D.O.   On: 05/28/2016 11:40   Dg Ankle Complete Right  Result Date: 05/28/2016 CLINICAL DATA:  Fall from ladder with right ankle pain,  initial encounter EXAM: RIGHT ANKLE - COMPLETE 3+ VIEW COMPARISON:  None. FINDINGS: There is no evidence of fracture, dislocation, or joint effusion. There is no evidence of arthropathy or other focal bone abnormality. Soft tissues are unremarkable. IMPRESSION: No acute abnormality noted. Electronically Signed   By: Alcide Clever M.D.   On: 05/28/2016 11:38   Ct Abdomen Pelvis W Contrast  Result Date: 05/28/2016 CLINICAL DATA:  Fall from ladder this morning. Low back pain. History of ventral hernia repair. EXAM: CT ABDOMEN AND PELVIS WITH CONTRAST TECHNIQUE: Multidetector CT imaging of the abdomen and pelvis was performed using the standard protocol following bolus administration of intravenous contrast. CONTRAST:  ISOVUE-300 IOPAMIDOL (ISOVUE-300) INJECTION 61% COMPARISON:  01/16/2016 abdominal radiograph. 07/12/2009 CT abdomen/pelvis. FINDINGS: Lower chest: No significant pulmonary nodules or acute consolidative airspace disease. Coronary atherosclerosis. Hepatobiliary: Liver surface is diffusely irregular with relative hypertrophy of the lateral segment left liver and caudate lobes, compatible cirrhosis. Peripheral segment 7 right liver lobe 2.8 x 2.4 cm hypoenhancing mass (series 3/ image 21). subcentimeter hypodense lateral segment left liver lobe lesion. No additional liver lesions. No liver laceration. Normal gallbladder with no radiopaque cholelithiasis. No biliary ductal dilatation. Pancreas: Heterogeneous fatty infiltration of the pancreas. Cystic 1.2 cm pancreatic tail lesion (series 3/image 28). No additional pancreatic lesions. No pancreatic duct dilation. Scattered calcifications throughout the pancreas are favored to represent vascular calcifications. Spleen: Normal size. No laceration or mass. Adrenals/Urinary Tract: Normal adrenals. No hydronephrosis. No renal laceration. Indeterminate hypodense partially calcified 2.6 x 1.8 cm posterior interpolar left renal lesion (series 3/ image 42).  Simple 3.4 cm anterior lower left renal cyst. Nonobstructing 9 mm interpolar and 9 mm lower right renal stones. Nonobstructing 11 mm lower left renal stone. Normal bladder. Stomach/Bowel: Grossly normal stomach. Normal caliber small bowel with no small bowel wall thickening. Appendectomy. Normal large bowel with no diverticulosis, large bowel wall thickening or pericolonic fat stranding. Vascular/Lymphatic: Atherosclerotic nonaneurysmal abdominal aorta. IVC filter is in place below the level of the renal veins. Patent portal, splenic and renal veins. Small gastroesophageal and paraumbilical varices. No pathologically enlarged lymph nodes in the abdomen or pelvis. Reproductive: Moderately enlarged prostate with nonspecific internal prostatic calcifications. Other: No pneumoperitoneum, ascites or focal fluid collection. No evidence of a recurrent ventral abdominal hernia status post mesh repair. Musculoskeletal: No aggressive appearing focal osseous lesions. Mild compression fracture of the superior L1 vertebral body with minimal 20% loss of vertebral body height, probably acute. No additional fracture. Moderate thoracolumbar spondylosis. IMPRESSION: 1. Mild L1 vertebral compression  fracture, probably acute. No additional acute traumatic injury in the abdomen or pelvis. 2. Cirrhosis. Hypoenhancing 2.8 cm segment 7 right liver lobe mass, cannot exclude hepatocellular carcinoma. MRI abdomen without and with IV contrast recommended on a short term outpatient basis for further characterization. 3. Small gastroesophageal and paraumbilical varices. No ascites. Normal size spleen. 4. Cystic 1.2 cm pancreatic tail lesion, which can also be further characterized at MRI abdomen without and with IV contrast. 5. Indeterminate low-attenuation posterior interpolar left renal mass, which can also be further characterized at MRI abdomen without and with IV contrast. 6. Additional findings include aortic atherosclerosis, coronary  atherosclerosis, nonobstructing bilateral nephrolithiasis and moderate prostatomegaly. Electronically Signed   By: Delbert Phenix M.D.   On: 05/28/2016 12:25    Procedures Procedures (including critical care time)  Medications Ordered in ED Medications  ondansetron (ZOFRAN) injection 4 mg (4 mg Intravenous Given 05/28/16 1106)  morphine 4 MG/ML injection 4 mg (4 mg Intravenous Given 05/28/16 1106)  iopamidol (ISOVUE-300) 61 % injection (100 mLs Intravenous Contrast Given 05/28/16 1147)  HYDROmorphone (DILAUDID) injection 1 mg (1 mg Intravenous Given 05/28/16 1312)     Initial Impression / Assessment and Plan / ED Course  I have reviewed the triage vital signs and the nursing notes.  Pertinent labs & imaging results that were available during my care of the patient were reviewed by me and considered in my medical decision making (see chart for details).     The patient says to the emergency department today after a mechanical fall off a 2 foot stepladder. Patient denies hitting his head or LOC. Endorses low back pain. Has not been ambulatory since the event. On exam patient with no focal neuro deficits. Neurovascularly intact in all extremities. No signs of head trauma. No neck pain. Patient does have midline L-spine and T-spine tenderness. Patient also tender in the abdomen. Chest x-ray reveals L1 compression fracture. CT scan of abdomen and pelvis confirms this finding. Incidental finding of liver mass and pancreatic mass noted. Patient informed of findings on a follow-up primary care doctor. Urine with hemoglobin, RBCs, WBCs, rare bacteria. Denies any urinary symptoms. Urine will be cultured and needs follow up PCP. Pain has not been able to manage in the ED. Patient needs assistance to walk and is in extreme pain with walking. TLSO brace was ordered for back. Follow-up patient will need admission for pain control and physical therapy. Spoke with Dr. Melynda Ripple with hospital medicine who agrees to see  patient in the ED in place admission orders. Patient is currently hemodynamically stable in no acute distress. Pain is manage while resting in the bed comfortably.  Final Clinical Impressions(s) / ED Diagnoses   Final diagnoses:  Fall, initial encounter  Closed compression fracture of first lumbar vertebra, initial encounter [facility])  Liver mass  Pancreatic mass    New Prescriptions New Prescriptions   No medications on file     Wallace Keller 05/28/16 1458    Doug Sou, MD 05/28/16 1759

## 2016-05-28 NOTE — ED Provider Notes (Signed)
Patient complains of low back pain, nonradiating since he fell approximately 2 feet off of a ladder medially prior to coming here. He denies loss consciousness denies headache denies neck pain denies abdominal pain no other associated symptoms. No treatment prior to coming here. Nothing makes symptoms better or worse. On exam alert Glasgow Coma Score 15 HEENT exam does fact atraumatic neck supple, nontender. Lungs clear equal breath sounds chest is nontender abdomen midline surgical scar, mild diffuse tenderness without guarding rigidity or rebound. Back minimally tender over lumbar spine. No flank tenderness pelvis stable nontender. Genitalia normal male. All 4 extremities without contusion abrasion or tenderness neurovascularly intact   Doug SouJacubowitz, Cornelius Marullo, MD 05/28/16 1057

## 2016-05-29 ENCOUNTER — Observation Stay (HOSPITAL_COMMUNITY): Payer: Medicare Other

## 2016-05-29 DIAGNOSIS — E119 Type 2 diabetes mellitus without complications: Secondary | ICD-10-CM

## 2016-05-29 DIAGNOSIS — K869 Disease of pancreas, unspecified: Secondary | ICD-10-CM | POA: Diagnosis not present

## 2016-05-29 DIAGNOSIS — S32010D Wedge compression fracture of first lumbar vertebra, subsequent encounter for fracture with routine healing: Secondary | ICD-10-CM | POA: Diagnosis not present

## 2016-05-29 DIAGNOSIS — R935 Abnormal findings on diagnostic imaging of other abdominal regions, including retroperitoneum: Secondary | ICD-10-CM | POA: Diagnosis not present

## 2016-05-29 DIAGNOSIS — I10 Essential (primary) hypertension: Secondary | ICD-10-CM | POA: Diagnosis not present

## 2016-05-29 DIAGNOSIS — W19XXXD Unspecified fall, subsequent encounter: Secondary | ICD-10-CM | POA: Diagnosis not present

## 2016-05-29 DIAGNOSIS — R160 Hepatomegaly, not elsewhere classified: Secondary | ICD-10-CM | POA: Diagnosis not present

## 2016-05-29 LAB — URINE CULTURE

## 2016-05-29 LAB — CBC
HCT: 37.1 % — ABNORMAL LOW (ref 39.0–52.0)
Hemoglobin: 12.5 g/dL — ABNORMAL LOW (ref 13.0–17.0)
MCH: 31.4 pg (ref 26.0–34.0)
MCHC: 33.7 g/dL (ref 30.0–36.0)
MCV: 93.2 fL (ref 78.0–100.0)
PLATELETS: 119 10*3/uL — AB (ref 150–400)
RBC: 3.98 MIL/uL — AB (ref 4.22–5.81)
RDW: 15.1 % (ref 11.5–15.5)
WBC: 7.2 10*3/uL (ref 4.0–10.5)

## 2016-05-29 LAB — CBC AND DIFFERENTIAL
HCT: 37 % — AB (ref 41–53)
Hemoglobin: 12.5 g/dL — AB (ref 13.5–17.5)
PLATELETS: 119 10*3/uL — AB (ref 150–399)
WBC: 7.2 10^3/mL

## 2016-05-29 LAB — GLUCOSE, CAPILLARY
Glucose-Capillary: 128 mg/dL — ABNORMAL HIGH (ref 65–99)
Glucose-Capillary: 170 mg/dL — ABNORMAL HIGH (ref 65–99)
Glucose-Capillary: 149 mg/dL — ABNORMAL HIGH (ref 65–99)
Glucose-Capillary: 176 mg/dL — ABNORMAL HIGH (ref 65–99)

## 2016-05-29 LAB — PROTIME-INR
INR: 1.92
PROTHROMBIN TIME: 22.3 s — AB (ref 11.4–15.2)
Protime: 22.3 seconds — AB (ref 10.0–13.8)

## 2016-05-29 LAB — BASIC METABOLIC PANEL
Anion gap: 9 (ref 5–15)
BUN: 14 mg/dL (ref 4–21)
BUN: 14 mg/dL (ref 6–20)
CALCIUM: 8.7 mg/dL — AB (ref 8.9–10.3)
CO2: 22 mmol/L (ref 22–32)
CREATININE: 0.92 mg/dL (ref 0.61–1.24)
Chloride: 105 mmol/L (ref 101–111)
Creatinine: 0.9 mg/dL (ref 0.6–1.3)
GFR calc non Af Amer: >60 mL/min (ref >60)
GLUCOSE: 130 mg/dL
Glucose, Bld: 130 mg/dL — ABNORMAL HIGH (ref 65–99)
POTASSIUM: 3.8 mmol/L (ref 3.4–5.3)
Potassium: 3.8 mmol/L (ref 3.5–5.1)
SODIUM: 136 mmol/L — AB (ref 137–147)
Sodium: 136 mmol/L (ref 135–145)

## 2016-05-29 LAB — POCT INR: INR: 1.9 — AB (ref 0.9–1.1)

## 2016-05-29 MED ORDER — WARFARIN SODIUM 2.5 MG PO TABS
4.5000 mg | ORAL_TABLET | ORAL | Status: AC
  Administered 2016-05-29: 04:00:00 4.5 mg via ORAL
  Filled 2016-05-29: qty 1

## 2016-05-29 MED ORDER — WARFARIN - PHARMACIST DOSING INPATIENT
Freq: Every day | Status: DC
  Administered 2016-05-29 – 2016-05-31 (×3)

## 2016-05-29 MED ORDER — WARFARIN SODIUM 3 MG PO TABS
3.0000 mg | ORAL_TABLET | Freq: Once | ORAL | Status: AC
  Administered 2016-05-29: 3 mg via ORAL
  Filled 2016-05-29: qty 1

## 2016-05-29 MED ORDER — WARFARIN SODIUM 3 MG PO TABS
3.0000 mg | ORAL_TABLET | ORAL | Status: DC
  Filled 2016-05-29: qty 1

## 2016-05-29 NOTE — Discharge Instructions (Signed)

## 2016-05-29 NOTE — Progress Notes (Signed)
PROGRESS NOTE    KVON MCILHENNY  ZOX:[id number] DOB: 1940/08/11 DOA: 05/28/2016 PCP: [name], MD    Brief Narrative: [name] is a 76 y.o. male  with past medical history of diabetes, hyperlipidemia, hypertension, kidney stones, stroke since to the emergency room after falling off a ladder. He was found to have a mild L1 fracture. A CT abd and pelvis shows liver cirrhosis and a 2.4 hypoechoic mass. MRI with and without contrast ordered for further evaluation.   Assessment & Plan:   Principal Problem:   Closed compression fracture of L1 lumbar vertebra (HCC) Active Problems:   HTN (hypertension)   DM II (diabetes mellitus, type II), controlled (HCC)   Closed compression fracture of body of lumbar vertebra (HCC)    Compression fracture of the L1 vertebra:  Pain control and brace.  Physical therapy.    Hypertension: well controlled.   Diabetes Mellitus:  CBG (last 3)   Recent Labs  05/28/16 2215 05/29/16 0616 05/29/16 1202  GLUCAP 120* 128* 170*    Resume SSI.    MILD NORMOCYTIC anemia; hemoglobin between 13 to12.   Mild thrombocytopenia: possibly from liver cirrhosis.  He had normal platelet count in 2013,   Liver cirrhosis of unclear etiology.  Pt was not aware of the liver cirrhosis until this admission.  A 2.4 hypoechoic mass found in the liver,  MRI of the abdomen with and without contrast ordered.    DVT prophylaxis: lovenox Code Status: DNR  Family Communication: none at bedside.  Disposition Plan: pending further eval.   Consultants:   None.    Procedures: MRI of the abdomen pending.    Antimicrobials:none.    Subjective:  feeling sleepy. Wants to go to the bathroom. Back pain is better.   Objective: Vitals:   05/28/16 1617 05/28/16 2219 05/29/16 0426 05/29/16 0500  BP: (!) 150/73 134/64 124/67 122/64  Pulse: 89 92 84 71  Resp: 18 14 14 16   Temp: 99.9 F (37.7 C) 99.7 F (37.6 C) 98.1 F (36.7 C) 98.6 F (37 C)    TempSrc: Oral Oral Oral Oral  SpO2: 95% 95% 93% 95%  Weight:      Height:        Intake/Output Summary (Last 24 hours) at 05/29/16 1236 Last data filed at 05/29/16 0943  Gross per 24 hour  Intake              240 ml  Output              500 ml  Net             -260 ml   Filed Weights   05/28/16 1009  Weight: 79.4 kg (175 lb)    Examination:  General exam: Appears sleepy Respiratory system: diminished at bases.  Cardiovascular system: S1 & S2 heard, RRR. No JVD, murmurs, rubs, gallops or clicks. No pedal edema. Gastrointestinal system: Abdomen is slightly distended. , soft and nontender. No organomegaly or masses felt. Normal bowel sounds heard. Central nervous system: Alert and oriented. No focal neurological deficits. Extremities: Symmetric 5 x 5 power. Skin: No rashes, lesions or ulcers     Data Reviewed: I have personally reviewed following labs and imaging studies  CBC:  Recent Labs Lab 05/28/16 1059 05/29/16 0638  WBC  --  7.2  HGB 13.6 12.5*  HCT 40.0 37.1*  MCV  --  93.2  PLT  --  119*   Basic Metabolic Panel:  Recent Labs Lab  05/28/16 1059 05/29/16 0638  NA 137 136  K 4.2 3.8  CL 105 105  CO2  --  22  GLUCOSE 203* 130*  BUN 15 14  CREATININE 0.80 0.92  CALCIUM  --  8.7*   GFR: Estimated Creatinine Clearance: 67.1 mL/min (by C-G formula based on SCr of 0.92 mg/dL). Liver Function Tests: No results for input(s): AST, ALT, ALKPHOS, BILITOT, PROT, ALBUMIN in the last 168 hours. No results for input(s): LIPASE, AMYLASE in the last 168 hours. No results for input(s): AMMONIA in the last 168 hours. Coagulation Profile:  Recent Labs Lab 05/28/16 1040 05/29/16 0638  INR 1.75 1.92   Cardiac Enzymes: No results for input(s): CKTOTAL, CKMB, CKMBINDEX, TROPONINI in the last 168 hours. BNP (last 3 results) No results for input(s): PROBNP in the last 8760 hours. HbA1C: No results for input(s): HGBA1C in the last 72 hours. CBG:  Recent  Labs Lab 05/28/16 1658 05/28/16 2215 05/29/16 0616 05/29/16 1202  GLUCAP 173* 120* 128* 170*   Lipid Profile: No results for input(s): CHOL, HDL, LDLCALC, TRIG, CHOLHDL, LDLDIRECT in the last 72 hours. Thyroid Function Tests: No results for input(s): TSH, T4TOTAL, FREET4, T3FREE, THYROIDAB in the last 72 hours. Anemia Panel: No results for input(s): VITAMINB12, FOLATE, FERRITIN, TIBC, IRON, RETICCTPCT in the last 72 hours. Sepsis Labs: No results for input(s): PROCALCITON, LATICACIDVEN in the last 168 hours.  Recent Results (from the past 240 hour(s))  Urine culture     Status: Abnormal   Collection Time: 05/28/16 11:15 AM  Result Value Ref Range Status   Specimen Description URINE, RANDOM  Final   Special Requests NONE  Final   Culture MULTIPLE SPECIES PRESENT, SUGGEST RECOLLECTION (A)  Final   Report Status 05/29/2016 FINAL  Final         Radiology Studies: Dg Chest 2 View  Result Date: 05/28/2016 CLINICAL DATA:  76 year old male with a history of fall from ladder EXAM: CHEST  2 VIEW COMPARISON:  08/27/2011, 01/31/2010 FINDINGS: Cardiomediastinal silhouette unchanged in size and contour. Calcifications of the aortic arch. No pneumothorax. No pleural effusion. Similar appearance of coarsened interstitial markings when compared to prior plain films. No confluent airspace disease. Lateral view demonstrates new compression fracture of L1 which was not present on a remote CT dated 06/13/2009. Multilevel degenerative changes of the visualized spine. IMPRESSION: Low lung volumes and chronic lung changes without evidence of superimposed acute cardiopulmonary disease. Lateral view of the chest incidentally image is a new compression fracture of L1. Comparison study is dated 06/13/2009. Chronicity is indeterminate, and if there is concern for acute pain at this level and possibility of acute fracture, plain film series may be useful. Aortic atherosclerosis. Electronically Signed   By: Gilmer Mor D.O.   On: 05/28/2016 11:40   Dg Ankle Complete Right  Result Date: 05/28/2016 CLINICAL DATA:  Fall from ladder with right ankle pain, initial encounter EXAM: RIGHT ANKLE - COMPLETE 3+ VIEW COMPARISON:  None. FINDINGS: There is no evidence of fracture, dislocation, or joint effusion. There is no evidence of arthropathy or other focal bone abnormality. Soft tissues are unremarkable. IMPRESSION: No acute abnormality noted. Electronically Signed   By: Alcide Clever M.D.   On: 05/28/2016 11:38   Ct Head Wo Contrast  Result Date: 05/29/2016 CLINICAL DATA:  Status post fall backwards off 2-step ladder, with concern for head or cervical spine injury. Initial encounter. EXAM: CT HEAD WITHOUT CONTRAST CT CERVICAL SPINE WITHOUT CONTRAST TECHNIQUE: Multidetector CT imaging of the head  and cervical spine was performed following the standard protocol without intravenous contrast. Multiplanar CT image reconstructions of the cervical spine were also generated. COMPARISON:  None. FINDINGS: CT HEAD FINDINGS Brain: No evidence of acute infarction, hemorrhage, hydrocephalus, extra-axial collection or mass lesion/mass effect. Prominence of the ventricles and sulci reflects mild to moderate cortical volume loss. Chronic encephalomalacia is noted at the left occipital lobe, reflecting remote infarct. Mild cerebellar atrophy is noted. Small chronic infarcts are noted at the left cerebellar hemisphere. A small chronic lacunar infarct is noted at the right basal ganglia. Mild periventricular white matter change likely reflects small vessel ischemic microangiopathy. The brainstem and fourth ventricle are within normal limits. No mass effect or midline shift is seen. Vascular: No hyperdense vessel or unexpected calcification. Skull: There is no evidence of fracture; visualized osseous structures are unremarkable in appearance. Sinuses/Orbits: The orbits are within normal limits. There is mild partial opacification of the left  maxillary sinus. The remaining paranasal sinuses and mastoid air cells are well-aerated. Other: No significant soft tissue abnormalities are seen. CT CERVICAL SPINE FINDINGS Alignment: Normal. Skull base and vertebrae: No acute fracture. No primary bone lesion or focal pathologic process. Soft tissues and spinal canal: No prevertebral fluid or swelling. No visible canal hematoma. Disc levels: There is mild disc space narrowing at C3-C4, with scattered anterior and posterior disc osteophyte complexes along the cervical spine. Upper chest: The visualized lung apices are clear. The thyroid gland is unremarkable in appearance. Other: No additional soft tissue abnormalities are seen. IMPRESSION: 1. No evidence of traumatic intracranial injury or fracture. 2. No evidence of fracture or subluxation along the cervical spine. 3. Mild to moderate cortical volume loss and scattered small vessel ischemic microangiopathy. 4. Chronic encephalomalacia at the left occipital lobe, reflecting remote infarct. Small chronic infarcts at the left cerebellar hemisphere, and small chronic lacunar infarct at the right basal ganglia. 5. Mild partial opacification of the left maxillary sinus. 6. Mild degenerative change along the cervical spine. Electronically Signed   By: Roanna Raider M.D.   On: 05/29/2016 02:05   Ct Cervical Spine Wo Contrast  Result Date: 05/29/2016 CLINICAL DATA:  Status post fall backwards off 2-step ladder, with concern for head or cervical spine injury. Initial encounter. EXAM: CT HEAD WITHOUT CONTRAST CT CERVICAL SPINE WITHOUT CONTRAST TECHNIQUE: Multidetector CT imaging of the head and cervical spine was performed following the standard protocol without intravenous contrast. Multiplanar CT image reconstructions of the cervical spine were also generated. COMPARISON:  None. FINDINGS: CT HEAD FINDINGS Brain: No evidence of acute infarction, hemorrhage, hydrocephalus, extra-axial collection or mass lesion/mass  effect. Prominence of the ventricles and sulci reflects mild to moderate cortical volume loss. Chronic encephalomalacia is noted at the left occipital lobe, reflecting remote infarct. Mild cerebellar atrophy is noted. Small chronic infarcts are noted at the left cerebellar hemisphere. A small chronic lacunar infarct is noted at the right basal ganglia. Mild periventricular white matter change likely reflects small vessel ischemic microangiopathy. The brainstem and fourth ventricle are within normal limits. No mass effect or midline shift is seen. Vascular: No hyperdense vessel or unexpected calcification. Skull: There is no evidence of fracture; visualized osseous structures are unremarkable in appearance. Sinuses/Orbits: The orbits are within normal limits. There is mild partial opacification of the left maxillary sinus. The remaining paranasal sinuses and mastoid air cells are well-aerated. Other: No significant soft tissue abnormalities are seen. CT CERVICAL SPINE FINDINGS Alignment: Normal. Skull base and vertebrae: No acute fracture. No primary bone  lesion or focal pathologic process. Soft tissues and spinal canal: No prevertebral fluid or swelling. No visible canal hematoma. Disc levels: There is mild disc space narrowing at C3-C4, with scattered anterior and posterior disc osteophyte complexes along the cervical spine. Upper chest: The visualized lung apices are clear. The thyroid gland is unremarkable in appearance. Other: No additional soft tissue abnormalities are seen. IMPRESSION: 1. No evidence of traumatic intracranial injury or fracture. 2. No evidence of fracture or subluxation along the cervical spine. 3. Mild to moderate cortical volume loss and scattered small vessel ischemic microangiopathy. 4. Chronic encephalomalacia at the left occipital lobe, reflecting remote infarct. Small chronic infarcts at the left cerebellar hemisphere, and small chronic lacunar infarct at the right basal ganglia. 5.  Mild partial opacification of the left maxillary sinus. 6. Mild degenerative change along the cervical spine. Electronically Signed   By: Roanna Raider M.D.   On: 05/29/2016 02:05   Ct Abdomen Pelvis W Contrast  Result Date: 05/28/2016 CLINICAL DATA:  Fall from ladder this morning. Low back pain. History of ventral hernia repair. EXAM: CT ABDOMEN AND PELVIS WITH CONTRAST TECHNIQUE: Multidetector CT imaging of the abdomen and pelvis was performed using the standard protocol following bolus administration of intravenous contrast. CONTRAST:  ISOVUE-300 IOPAMIDOL (ISOVUE-300) INJECTION 61% COMPARISON:  01/16/2016 abdominal radiograph. 07/12/2009 CT abdomen/pelvis. FINDINGS: Lower chest: No significant pulmonary nodules or acute consolidative airspace disease. Coronary atherosclerosis. Hepatobiliary: Liver surface is diffusely irregular with relative hypertrophy of the lateral segment left liver and caudate lobes, compatible cirrhosis. Peripheral segment 7 right liver lobe 2.8 x 2.4 cm hypoenhancing mass (series 3/ image 21). subcentimeter hypodense lateral segment left liver lobe lesion. No additional liver lesions. No liver laceration. Normal gallbladder with no radiopaque cholelithiasis. No biliary ductal dilatation. Pancreas: Heterogeneous fatty infiltration of the pancreas. Cystic 1.2 cm pancreatic tail lesion (series 3/image 28). No additional pancreatic lesions. No pancreatic duct dilation. Scattered calcifications throughout the pancreas are favored to represent vascular calcifications. Spleen: Normal size. No laceration or mass. Adrenals/Urinary Tract: Normal adrenals. No hydronephrosis. No renal laceration. Indeterminate hypodense partially calcified 2.6 x 1.8 cm posterior interpolar left renal lesion (series 3/ image 42). Simple 3.4 cm anterior lower left renal cyst. Nonobstructing 9 mm interpolar and 9 mm lower right renal stones. Nonobstructing 11 mm lower left renal stone. Normal bladder.  Stomach/Bowel: Grossly normal stomach. Normal caliber small bowel with no small bowel wall thickening. Appendectomy. Normal large bowel with no diverticulosis, large bowel wall thickening or pericolonic fat stranding. Vascular/Lymphatic: Atherosclerotic nonaneurysmal abdominal aorta. IVC filter is in place below the level of the renal veins. Patent portal, splenic and renal veins. Small gastroesophageal and paraumbilical varices. No pathologically enlarged lymph nodes in the abdomen or pelvis. Reproductive: Moderately enlarged prostate with nonspecific internal prostatic calcifications. Other: No pneumoperitoneum, ascites or focal fluid collection. No evidence of a recurrent ventral abdominal hernia status post mesh repair. Musculoskeletal: No aggressive appearing focal osseous lesions. Mild compression fracture of the superior L1 vertebral body with minimal 20% loss of vertebral body height, probably acute. No additional fracture. Moderate thoracolumbar spondylosis. IMPRESSION: 1. Mild L1 vertebral compression fracture, probably acute. No additional acute traumatic injury in the abdomen or pelvis. 2. Cirrhosis. Hypoenhancing 2.8 cm segment 7 right liver lobe mass, cannot exclude hepatocellular carcinoma. MRI abdomen without and with IV contrast recommended on a short term outpatient basis for further characterization. 3. Small gastroesophageal and paraumbilical varices. No ascites. Normal size spleen. 4. Cystic 1.2 cm pancreatic tail lesion, which  can also be further characterized at MRI abdomen without and with IV contrast. 5. Indeterminate low-attenuation posterior interpolar left renal mass, which can also be further characterized at MRI abdomen without and with IV contrast. 6. Additional findings include aortic atherosclerosis, coronary atherosclerosis, nonobstructing bilateral nephrolithiasis and moderate prostatomegaly. Electronically Signed   By: Delbert PhenixJason A Poff M.D.   On: 05/28/2016 12:25         Scheduled Meds: . insulin aspart  0-15 Units Subcutaneous TID WC  . losartan  100 mg Oral q morning - 10a  . simvastatin  40 mg Oral QPM  . warfarin  3 mg Oral Once  . Warfarin - Pharmacist Dosing Inpatient   Does not apply q1800   Continuous Infusions:   LOS: 0 days    Time spent: 35 minutes.     Kathlen ModyAKULA,Mushka Laconte, MD [facility] Pager [phone] If 7PM-7AM, please contact night-coverage [email or web address] Password [email or web address] 05/29/2016, 12:36 PM

## 2016-05-29 NOTE — Care Management Obs Status (Signed)
MEDICARE OBSERVATION STATUS NOTIFICATION   Patient Details  Name: [name] MRN: [id number] Date of Birth: 05/25/1940   Medicare Observation Status Notification Given:  Yes    Durenda GuthrieBrady, Azaliyah Kennard Naomi, RN 05/29/2016, 2:53 PM

## 2016-05-29 NOTE — Progress Notes (Signed)
ANTICOAGULATION CONSULT NOTE - Follow Up Consult  Pharmacy Consult:  Coumadin Indication: atrial fibrillation  Allergies  Allergen Reactions  . Prednisone     Other reaction(s): Other (See Comments) unknown  . Zithromax [Azithromycin] Other (See Comments)    Weak and flulike symptoms    Patient Measurements: Height: 5\' 4"  (162.6 cm) Weight: 175 lb (79.4 kg) IBW/kg (Calculated) : 59.2  Vital Signs: Temp: 98.6 F (37 C) (05/25 0500) Temp Source: Oral (05/25 0500) BP: 122/64 (05/25 0500) Pulse Rate: 71 (05/25 0500)  Labs:  Recent Labs  05/28/16 1040 05/28/16 1059 05/29/16 0638  HGB  --  13.6 12.5*  HCT  --  40.0 37.1*  PLT  --   --  PENDING  LABPROT 20.7*  --  22.3*  INR 1.75  --  1.92  CREATININE  --  0.80 0.92    Estimated Creatinine Clearance: 67.1 mL/min (by C-G formula based on SCr of 0.92 mg/dL).     Assessment: [age] YOM admitted s/p fall from ladder.  CT negative for bleeding and Coumadin was continued from PTA for history of Afib/CVA.  Coumadin dose was given late.  INR approaching goal.  Home Coumadin dose:  3mg  PO daily   Goal of Therapy:  INR 2-3    Plan:  - Coumadin 3mg  PO today, delay to 2200 since yesterday's dose was given this AM - Daily PT / INR   Truxton Stupka D. Laney Potashang, PharmD, BCPS Pager:  [phone] 05/29/2016, 8:19 AM

## 2016-05-30 DIAGNOSIS — E119 Type 2 diabetes mellitus without complications: Secondary | ICD-10-CM | POA: Diagnosis not present

## 2016-05-30 DIAGNOSIS — K869 Disease of pancreas, unspecified: Secondary | ICD-10-CM

## 2016-05-30 DIAGNOSIS — S32010D Wedge compression fracture of first lumbar vertebra, subsequent encounter for fracture with routine healing: Secondary | ICD-10-CM | POA: Diagnosis not present

## 2016-05-30 DIAGNOSIS — W19XXXD Unspecified fall, subsequent encounter: Secondary | ICD-10-CM

## 2016-05-30 DIAGNOSIS — R160 Hepatomegaly, not elsewhere classified: Secondary | ICD-10-CM

## 2016-05-30 DIAGNOSIS — I10 Essential (primary) hypertension: Secondary | ICD-10-CM | POA: Diagnosis not present

## 2016-05-30 LAB — PROTIME-INR
INR: 2.2
Prothrombin Time: 24.8 seconds — ABNORMAL HIGH (ref 11.4–15.2)

## 2016-05-30 LAB — GLUCOSE, CAPILLARY
Glucose-Capillary: 145 mg/dL — ABNORMAL HIGH (ref 65–99)
Glucose-Capillary: 146 mg/dL — ABNORMAL HIGH (ref 65–99)
Glucose-Capillary: 137 mg/dL — ABNORMAL HIGH (ref 65–99)
Glucose-Capillary: 135 mg/dL — ABNORMAL HIGH (ref 65–99)

## 2016-05-30 MED ORDER — WARFARIN SODIUM 3 MG PO TABS
3.0000 mg | ORAL_TABLET | Freq: Once | ORAL | Status: AC
  Administered 2016-05-30: 3 mg via ORAL
  Filled 2016-05-30: qty 1

## 2016-05-30 MED ORDER — GADOBENATE DIMEGLUMINE 529 MG/ML IV SOLN
15.0000 mL | Freq: Once | INTRAVENOUS | Status: AC | PRN
  Administered 2016-05-30: 15 mL via INTRAVENOUS

## 2016-05-30 NOTE — Progress Notes (Signed)
ANTICOAGULATION CONSULT NOTE - Follow Up Consult  Pharmacy Consult:  Coumadin Indication: atrial fibrillation  Allergies  Allergen Reactions  . Prednisone     Other reaction(s): Other (See Comments) unknown  . Zithromax [Azithromycin] Other (See Comments)    Weak and flulike symptoms    Patient Measurements: Height: 5\' 4"  (162.6 cm) Weight: 175 lb (79.4 kg) IBW/kg (Calculated) : 59.2  Vital Signs: Temp: 98.6 F (37 C) (05/26 0550) Temp Source: Oral (05/26 0550) BP: 141/70 (05/26 0550) Pulse Rate: 79 (05/26 0550)  Labs:  Recent Labs  05/28/16 1040 05/28/16 1059 05/29/16 0638 05/30/16 0410  HGB  --  13.6 12.5*  --   HCT  --  40.0 37.1*  --   PLT  --   --  119*  --   LABPROT 20.7*  --  22.3* 24.8*  INR 1.75  --  1.92 2.20  CREATININE  --  0.80 0.92  --     Estimated Creatinine Clearance: 67.1 mL/min (by C-G formula based on SCr of 0.92 mg/dL).  Assessment: 89 YOM admitted s/p fall from ladder on Coumadin 3mg  daily PTA for hx AFib/CVA, post IVC filter 2002. INR on admit low at 1.72. CT negative for bleed. Coumadin continued. INR trending up and now therapeutic at 2.2. Hgb 12.5, plts low at 119.   Goal of Therapy:  INR 2-3   Plan:  Give Coumadin 3mg  PO daily Monitor daily INR, CBC, s/s of bleed  Enzo BiNathan Matilde Markie, PharmD, [facility]-Park[facility] CampusBCPS Clinical Pharmacist Pager [phone] 05/30/2016 9:06 AM

## 2016-05-30 NOTE — Progress Notes (Signed)
PROGRESS NOTE    [name]  ZOX:[id number] DOB: 16-Apr-1940 DOA: 05/28/2016 PCP: [name], MD    Brief Narrative: [name] is a 76 y.o. male  with past medical history of diabetes, hyperlipidemia, hypertension, kidney stones, stroke since to the emergency room after falling off a ladder. He was found to have a mild L1 fracture. A CT abd and pelvis shows liver cirrhosis and a 2.4 hypoechoic mass. MRI with and without contrast ordered for further evaluation.   Assessment & Plan:   Principal Problem:   Closed compression fracture of L1 lumbar vertebra (HCC) Active Problems:   HTN (hypertension)   DM II (diabetes mellitus, type II), controlled (HCC)   Closed compression fracture of body of lumbar vertebra (HCC)    Compression fracture of the L1 vertebra:  Pain control and brace.  Physical therapy.    Hypertension: well controlled.   Diabetes Mellitus:  CBG (last 3)   Recent Labs  05/30/16 0553 05/30/16 1159 05/30/16 1656  GLUCAP 145* 146* 137*    Resume SSI.    MILD NORMOCYTIC anemia; hemoglobin between 13 to12. Repeat cbc in am.   Mild thrombocytopenia: possibly from liver cirrhosis.  He had normal platelet count in 2013,   Liver cirrhosis of unclear etiology.  Pt was not aware of the liver cirrhosis until this admission.  A 2.4 hypoechoic mass found in the liver,  MRI of the abdomen with and without contrast ordered.   H/o stroke: on coumadin.    DVT prophylaxis: lovenox Code Status: DNR  Family Communication: none at bedside.  Disposition Plan: pending further eval. By PT  Consultants:   None.    Procedures: MRI of the abdomen pending.    Antimicrobials:none.    Subjective: REPORTS persistent back pain.  Objective: Vitals:   05/29/16 0500 05/29/16 1426 05/29/16 2050 05/30/16 0550  BP: 122/64 140/64 (!) 143/66 (!) 141/70  Pulse: 71 75 83 79  Resp: 16 16 18    Temp: 98.6 F (37 C) 99.2 F (37.3 C)  98.6 F (37 C)    TempSrc: Oral Oral Oral Oral  SpO2: 95% 95% 92% 93%  Weight:      Height:        Intake/Output Summary (Last 24 hours) at 05/30/16 1749 Last data filed at 05/30/16 0837  Gross per 24 hour  Intake              480 ml  Output                0 ml  Net              480 ml   Filed Weights   05/28/16 1009  Weight: 79.4 kg (175 lb)    Examination:  General exam: Appears sleepy Respiratory system: diminished at bases.  Cardiovascular system: S1 & S2 heard, RRR. No JVD, murmurs, rubs, gallops or clicks. No pedal edema. Gastrointestinal system: Abdomen is slightly distended. , soft and nontender. No organomegaly or masses felt. Normal bowel sounds heard. Central nervous system: Alert and oriented. No focal neurological deficits. Extremities: Symmetric 5 x 5 power. Skin: No rashes, lesions or ulcers     Data Reviewed: I have personally reviewed following labs and imaging studies  CBC:  Recent Labs Lab 05/28/16 1059 05/29/16 0638  WBC  --  7.2  HGB 13.6 12.5*  HCT 40.0 37.1*  MCV  --  93.2  PLT  --  119*   Basic Metabolic Panel:  Recent  Labs Lab 05/28/16 1059 05/29/16 0638  NA 137 136  K 4.2 3.8  CL 105 105  CO2  --  22  GLUCOSE 203* 130*  BUN 15 14  CREATININE 0.80 0.92  CALCIUM  --  8.7*   GFR: Estimated Creatinine Clearance: 67.1 mL/min (by C-G formula based on SCr of 0.92 mg/dL). Liver Function Tests: No results for input(s): AST, ALT, ALKPHOS, BILITOT, PROT, ALBUMIN in the last 168 hours. No results for input(s): LIPASE, AMYLASE in the last 168 hours. No results for input(s): AMMONIA in the last 168 hours. Coagulation Profile:  Recent Labs Lab 05/28/16 1040 05/29/16 0638 05/30/16 0410  INR 1.75 1.92 2.20   Cardiac Enzymes: No results for input(s): CKTOTAL, CKMB, CKMBINDEX, TROPONINI in the last 168 hours. BNP (last 3 results) No results for input(s): PROBNP in the last 8760 hours. HbA1C: No results for input(s): HGBA1C in the last 72  hours. CBG:  Recent Labs Lab 05/29/16 1644 05/29/16 2109 05/30/16 0553 05/30/16 1159 05/30/16 1656  GLUCAP 149* 176* 145* 146* 137*   Lipid Profile: No results for input(s): CHOL, HDL, LDLCALC, TRIG, CHOLHDL, LDLDIRECT in the last 72 hours. Thyroid Function Tests: No results for input(s): TSH, T4TOTAL, FREET4, T3FREE, THYROIDAB in the last 72 hours. Anemia Panel: No results for input(s): VITAMINB12, FOLATE, FERRITIN, TIBC, IRON, RETICCTPCT in the last 72 hours. Sepsis Labs: No results for input(s): PROCALCITON, LATICACIDVEN in the last 168 hours.  Recent Results (from the past 240 hour(s))  Urine culture     Status: Abnormal   Collection Time: 05/28/16 11:15 AM  Result Value Ref Range Status   Specimen Description URINE, RANDOM  Final   Special Requests NONE  Final   Culture MULTIPLE SPECIES PRESENT, SUGGEST RECOLLECTION (A)  Final   Report Status 05/29/2016 FINAL  Final         Radiology Studies: Ct Head Wo Contrast  Result Date: 05/29/2016 CLINICAL DATA:  Status post fall backwards off 2-step ladder, with concern for head or cervical spine injury. Initial encounter. EXAM: CT HEAD WITHOUT CONTRAST CT CERVICAL SPINE WITHOUT CONTRAST TECHNIQUE: Multidetector CT imaging of the head and cervical spine was performed following the standard protocol without intravenous contrast. Multiplanar CT image reconstructions of the cervical spine were also generated. COMPARISON:  None. FINDINGS: CT HEAD FINDINGS Brain: No evidence of acute infarction, hemorrhage, hydrocephalus, extra-axial collection or mass lesion/mass effect. Prominence of the ventricles and sulci reflects mild to moderate cortical volume loss. Chronic encephalomalacia is noted at the left occipital lobe, reflecting remote infarct. Mild cerebellar atrophy is noted. Small chronic infarcts are noted at the left cerebellar hemisphere. A small chronic lacunar infarct is noted at the right basal ganglia. Mild periventricular white  matter change likely reflects small vessel ischemic microangiopathy. The brainstem and fourth ventricle are within normal limits. No mass effect or midline shift is seen. Vascular: No hyperdense vessel or unexpected calcification. Skull: There is no evidence of fracture; visualized osseous structures are unremarkable in appearance. Sinuses/Orbits: The orbits are within normal limits. There is mild partial opacification of the left maxillary sinus. The remaining paranasal sinuses and mastoid air cells are well-aerated. Other: No significant soft tissue abnormalities are seen. CT CERVICAL SPINE FINDINGS Alignment: Normal. Skull base and vertebrae: No acute fracture. No primary bone lesion or focal pathologic process. Soft tissues and spinal canal: No prevertebral fluid or swelling. No visible canal hematoma. Disc levels: There is mild disc space narrowing at C3-C4, with scattered anterior and posterior disc osteophyte complexes along  the cervical spine. Upper chest: The visualized lung apices are clear. The thyroid gland is unremarkable in appearance. Other: No additional soft tissue abnormalities are seen. IMPRESSION: 1. No evidence of traumatic intracranial injury or fracture. 2. No evidence of fracture or subluxation along the cervical spine. 3. Mild to moderate cortical volume loss and scattered small vessel ischemic microangiopathy. 4. Chronic encephalomalacia at the left occipital lobe, reflecting remote infarct. Small chronic infarcts at the left cerebellar hemisphere, and small chronic lacunar infarct at the right basal ganglia. 5. Mild partial opacification of the left maxillary sinus. 6. Mild degenerative change along the cervical spine. Electronically Signed   By: Roanna Raider M.D.   On: 05/29/2016 02:05   Ct Cervical Spine Wo Contrast  Result Date: 05/29/2016 CLINICAL DATA:  Status post fall backwards off 2-step ladder, with concern for head or cervical spine injury. Initial encounter. EXAM: CT HEAD  WITHOUT CONTRAST CT CERVICAL SPINE WITHOUT CONTRAST TECHNIQUE: Multidetector CT imaging of the head and cervical spine was performed following the standard protocol without intravenous contrast. Multiplanar CT image reconstructions of the cervical spine were also generated. COMPARISON:  None. FINDINGS: CT HEAD FINDINGS Brain: No evidence of acute infarction, hemorrhage, hydrocephalus, extra-axial collection or mass lesion/mass effect. Prominence of the ventricles and sulci reflects mild to moderate cortical volume loss. Chronic encephalomalacia is noted at the left occipital lobe, reflecting remote infarct. Mild cerebellar atrophy is noted. Small chronic infarcts are noted at the left cerebellar hemisphere. A small chronic lacunar infarct is noted at the right basal ganglia. Mild periventricular white matter change likely reflects small vessel ischemic microangiopathy. The brainstem and fourth ventricle are within normal limits. No mass effect or midline shift is seen. Vascular: No hyperdense vessel or unexpected calcification. Skull: There is no evidence of fracture; visualized osseous structures are unremarkable in appearance. Sinuses/Orbits: The orbits are within normal limits. There is mild partial opacification of the left maxillary sinus. The remaining paranasal sinuses and mastoid air cells are well-aerated. Other: No significant soft tissue abnormalities are seen. CT CERVICAL SPINE FINDINGS Alignment: Normal. Skull base and vertebrae: No acute fracture. No primary bone lesion or focal pathologic process. Soft tissues and spinal canal: No prevertebral fluid or swelling. No visible canal hematoma. Disc levels: There is mild disc space narrowing at C3-C4, with scattered anterior and posterior disc osteophyte complexes along the cervical spine. Upper chest: The visualized lung apices are clear. The thyroid gland is unremarkable in appearance. Other: No additional soft tissue abnormalities are seen. IMPRESSION:  1. No evidence of traumatic intracranial injury or fracture. 2. No evidence of fracture or subluxation along the cervical spine. 3. Mild to moderate cortical volume loss and scattered small vessel ischemic microangiopathy. 4. Chronic encephalomalacia at the left occipital lobe, reflecting remote infarct. Small chronic infarcts at the left cerebellar hemisphere, and small chronic lacunar infarct at the right basal ganglia. 5. Mild partial opacification of the left maxillary sinus. 6. Mild degenerative change along the cervical spine. Electronically Signed   By: Roanna Raider M.D.   On: 05/29/2016 02:05        Scheduled Meds: . insulin aspart  0-15 Units Subcutaneous TID WC  . losartan  100 mg Oral q morning - 10a  . simvastatin  40 mg Oral QPM  . Warfarin - Pharmacist Dosing Inpatient   Does not apply q1800   Continuous Infusions:   LOS: 0 days    Time spent: 35 minutes.     Kathlen Mody, MD [facility] Pager [phone]  If 7PM-7AM, please contact night-coverage [email or web address] Password [email or web address] 05/30/2016, 5:49 PM

## 2016-05-31 DIAGNOSIS — S32010D Wedge compression fracture of first lumbar vertebra, subsequent encounter for fracture with routine healing: Secondary | ICD-10-CM | POA: Diagnosis not present

## 2016-05-31 DIAGNOSIS — E119 Type 2 diabetes mellitus without complications: Secondary | ICD-10-CM | POA: Diagnosis not present

## 2016-05-31 DIAGNOSIS — R935 Abnormal findings on diagnostic imaging of other abdominal regions, including retroperitoneum: Secondary | ICD-10-CM | POA: Diagnosis not present

## 2016-05-31 LAB — BASIC METABOLIC PANEL
ANION GAP: 9 (ref 5–15)
BUN: 13 mg/dL (ref 4–21)
BUN: 13 mg/dL (ref 6–20)
CHLORIDE: 104 mmol/L (ref 101–111)
CO2: 23 mmol/L (ref 22–32)
Calcium: 8.8 mg/dL — ABNORMAL LOW (ref 8.9–10.3)
Creatinine, Ser: 0.74 mg/dL (ref 0.61–1.24)
Creatinine: 0.7 mg/dL (ref 0.6–1.3)
GFR calc Af Amer: >60 mL/min (ref >60)
GFR calc non Af Amer: >60 mL/min (ref >60)
GLUCOSE: 150 mg/dL
GLUCOSE: 150 mg/dL — AB (ref 65–99)
POTASSIUM: 3.6 mmol/L (ref 3.5–5.1)
SODIUM: 136 mmol/L — AB (ref 137–147)
Sodium: 136 mmol/L (ref 135–145)

## 2016-05-31 LAB — GLUCOSE, CAPILLARY
GLUCOSE-CAPILLARY: 104 mg/dL — AB (ref 65–99)
GLUCOSE-CAPILLARY: 133 mg/dL — AB (ref 65–99)
Glucose-Capillary: 153 mg/dL — ABNORMAL HIGH (ref 65–99)
Glucose-Capillary: 104 mg/dL — ABNORMAL HIGH (ref 65–99)

## 2016-05-31 LAB — CBC
HEMATOCRIT: 39.1 % (ref 39.0–52.0)
HEMOGLOBIN: 13 g/dL (ref 13.0–17.0)
MCH: 30.8 pg (ref 26.0–34.0)
MCHC: 33.2 g/dL (ref 30.0–36.0)
MCV: 92.7 fL (ref 78.0–100.0)
Platelets: 128 10*3/uL — ABNORMAL LOW (ref 150–400)
RBC: 4.22 MIL/uL (ref 4.22–5.81)
RDW: 15 % (ref 11.5–15.5)
WBC: 7.7 10*3/uL (ref 4.0–10.5)

## 2016-05-31 LAB — CBC AND DIFFERENTIAL: WBC: 7.7 10^3/mL

## 2016-05-31 LAB — PROTIME-INR
INR: 2.18
PROTIME: 24.6 s — AB (ref 10.0–13.8)
Prothrombin Time: 24.6 seconds — ABNORMAL HIGH (ref 11.4–15.2)

## 2016-05-31 LAB — POCT INR: INR: 2.2 — AB (ref 0.9–1.1)

## 2016-05-31 MED ORDER — WARFARIN SODIUM 3 MG PO TABS
3.0000 mg | ORAL_TABLET | Freq: Once | ORAL | Status: AC
  Administered 2016-05-31: 3 mg via ORAL
  Filled 2016-05-31: qty 1

## 2016-05-31 MED ORDER — OXYCODONE HCL 5 MG PO TABS
5.0000 mg | ORAL_TABLET | ORAL | Status: DC | PRN
  Administered 2016-05-31 – 2016-06-01 (×5): 5 mg via ORAL
  Filled 2016-05-31 (×5): qty 1

## 2016-05-31 NOTE — Progress Notes (Signed)
PROGRESS NOTE    [name]  ZOX:[id number] DOB: 11/07/1940 DOA: 05/28/2016 PCP: [name], MD    Brief Narrative: [name] is a 76 y.o. male  with past medical history of diabetes, hyperlipidemia, hypertension, kidney stones, stroke since to the emergency room after falling off a ladder. He was found to have a mild L1 fracture. A CT abd and pelvis shows liver cirrhosis and a 2.4 hypoechoic mass. MRI with and without contrast ordered for further evaluation.   Assessment & Plan:   Compression fracture of the L1 vertebra:  -continue Brace -PT consult-ordered -change IV dilaudid to oral oxycodone -Ambulate  Hypertension: well controlled.   Diabetes Mellitus:  -stable,  Continue SSI  Mild thrombocytopenia: possibly from liver cirrhosis.  -stable  Liver cirrhosis of unclear etiology.  Pt was not aware of the liver cirrhosis until this admission.  A 2.4 hypoechoic mass found in the liver,  MRI of the abdomen suggests this is a hemangioma, needs FU  Suspected pancreatic cyst -needs FU MRI down the road  H/o Afib/ stroke: on coumadin.   DVT prophylaxis: coumadin Code Status: DNR  Family Communication: none at bedside.  Disposition Plan: home tomorrow, PT eval/trial of oral pain meds  Consultants:   None.    Procedures: MRI of the abdomen pending.    Antimicrobials:none.    Subjective: Still having back pain but moving, some dilaudid helping  Objective: Vitals:   05/29/16 2050 05/30/16 0550 05/30/16 2252 05/31/16 0611  BP: (!) 143/66 (!) 141/70 (!) 161/78 139/77  Pulse: 83 79 87 83  Resp: 18  18 16   Temp:  98.6 F (37 C) 99.3 F (37.4 C) 98.9 F (37.2 C)  TempSrc: Oral Oral Oral Oral  SpO2: 92% 93% 95% 94%  Weight:      Height:        Intake/Output Summary (Last 24 hours) at 05/31/16 1116 Last data filed at 05/31/16 0946  Gross per 24 hour  Intake              960 ml  Output              600 ml  Net              360 ml   Filed  Weights   05/28/16 1009  Weight: 79.4 kg (175 lb)    Examination:  General exam: Alert, awake, oriented x2 Respiratory system: decreased BS , no rales or wheezes Cardiovascular system: S1 & S2 heard, RRR. No JVD, murmurs,  Gastrointestinal system: Abdomen is slightly distended. , soft and nontender. No organomegaly or masses felt. Normal bowel sounds heard. Braces noted Central nervous system: Alert and oriented. No focal neurological deficits. Extremities: Symmetric 5 x 5 power. Skin: No rashes, lesions or ulcers     Data Reviewed: I have personally reviewed following labs and imaging studies  CBC:  Recent Labs Lab 05/28/16 1059 05/29/16 0638 05/31/16 0352  WBC  --  7.2 7.7  HGB 13.6 12.5* 13.0  HCT 40.0 37.1* 39.1  MCV  --  93.2 92.7  PLT  --  119* 128*   Basic Metabolic Panel:  Recent Labs Lab 05/28/16 1059 05/29/16 0638 05/31/16 0352  NA 137 136 136  K 4.2 3.8 3.6  CL 105 105 104  CO2  --  22 23  GLUCOSE 203* 130* 150*  BUN 15 14 13   CREATININE 0.80 0.92 0.74  CALCIUM  --  8.7* 8.8*   GFR: Estimated Creatinine  Clearance: 77.1 mL/min (by C-G formula based on SCr of 0.74 mg/dL). Liver Function Tests: No results for input(s): AST, ALT, ALKPHOS, BILITOT, PROT, ALBUMIN in the last 168 hours. No results for input(s): LIPASE, AMYLASE in the last 168 hours. No results for input(s): AMMONIA in the last 168 hours. Coagulation Profile:  Recent Labs Lab 05/28/16 1040 05/29/16 0638 05/30/16 0410 05/31/16 0352  INR 1.75 1.92 2.20 2.18   Cardiac Enzymes: No results for input(s): CKTOTAL, CKMB, CKMBINDEX, TROPONINI in the last 168 hours. BNP (last 3 results) No results for input(s): PROBNP in the last 8760 hours. HbA1C: No results for input(s): HGBA1C in the last 72 hours. CBG:  Recent Labs Lab 05/30/16 0553 05/30/16 1159 05/30/16 1656 05/30/16 2252 05/31/16 0610  GLUCAP 145* 146* 137* 135* 153*   Lipid Profile: No results for input(s): CHOL,  HDL, LDLCALC, TRIG, CHOLHDL, LDLDIRECT in the last 72 hours. Thyroid Function Tests: No results for input(s): TSH, T4TOTAL, FREET4, T3FREE, THYROIDAB in the last 72 hours. Anemia Panel: No results for input(s): VITAMINB12, FOLATE, FERRITIN, TIBC, IRON, RETICCTPCT in the last 72 hours. Sepsis Labs: No results for input(s): PROCALCITON, LATICACIDVEN in the last 168 hours.  Recent Results (from the past 240 hour(s))  Urine culture     Status: Abnormal   Collection Time: 05/28/16 11:15 AM  Result Value Ref Range Status   Specimen Description URINE, RANDOM  Final   Special Requests NONE  Final   Culture MULTIPLE SPECIES PRESENT, SUGGEST RECOLLECTION (A)  Final   Report Status 05/29/2016 FINAL  Final         Radiology Studies: Mr Abdomen W Wo Contrast  Result Date: 05/30/2016 CLINICAL DATA:  76 year old male with history of diabetes, hyperlipidemia and hypertension with history of trauma from a recent fall off of a ladder. L1 vertebral compression fracture. Indeterminate liver lesion noted on prior CT examination. EXAM: MRI ABDOMEN WITHOUT AND WITH CONTRAST TECHNIQUE: Multiplanar multisequence MR imaging of the abdomen was performed both before and after the administration of intravenous contrast. CONTRAST:  15mL MULTIHANCE GADOBENATE DIMEGLUMINE 529 MG/ML IV SOLN COMPARISON:  No prior abdominal MRI.  Abdominal CT scan 05/28/2016. FINDINGS: Lower chest: Unremarkable. Hepatobiliary: Liver has a shrunken appearance and nodular contour with hypertrophy of the caudate lobe, indicative of underlying cirrhosis. In the lateral aspect of segment 7 of the liver (image 13 of series 5) there is a 2.2 x 2.3 cm lesion that is T1 hypointense, slightly T2 hyperintense, does not restrict diffusion, is hypovascular, but demonstrates progressive delayed enhancement (without characteristic early peripheral nodular enhancement and centripetal filling as seen with most hemangiomas). 8 mm T1 hypointense, T2  hyperintense, nonenhancing lesion in the inferior aspect of segment 3 of the liver, compatible with a tiny simple cysts. No other suspicious hepatic lesions are noted. No intra or extrahepatic biliary ductal dilatation. Gallbladder is normal in appearance. Pancreas: In the tail of the pancreas there is a 1.3 x 1.0 cm well-defined T1 hypointense, T2 hyperintense lesion which does not restrict diffusion, and demonstrates no internal enhancement on post gadolinium imaging, favored to represent a tiny pancreatic pseudocyst. No other larger more suspicious appearing pancreatic mass. No pancreatic ductal dilatation. No pancreatic or peripancreatic fluid or inflammatory changes. Spleen:  Unremarkable. Adrenals/Urinary Tract: 3.1 cm exophytic T1 hypointense, T2 hyperintense, nonenhancing lesion in the lower pole of the left kidney compatible with a simple cyst. No other suspicious renal lesions. No hydroureteronephrosis in the visualized abdomen. Bilateral adrenal glands are normal in appearance. Stomach/Bowel: Visualized portions are  unremarkable. Vascular/Lymphatic: No aneurysm identified in the visualized abdominal vasculature. Portal vein is mildly dilated measuring 16 mm in diameter, suggesting underlying portal hypertension. Several small distal esophageal varices are noted. IVC filter in position with tip terminating immediately below the level of the renal veins. No lymphadenopathy noted in the abdomen. Other: No significant volume of ascites in the visualized portions of the peritoneal cavity. Musculoskeletal: No aggressive osseous lesions are noted in the visualized portions of the skeleton. Compression fracture of superior endplate of L1 again noted, with very mild compression on the left side (approximately 10%). IMPRESSION: 1. The lesion of concern in segment 7 of the liver has indeterminate imaging characteristics. Overall, this lesion is favored to be benign, likely an atypical hemangioma, however, a repeat  MRI of the abdomen with and without IV gadolinium is recommended in 3 months to ensure the stability of this lesion. 2. Stigmata of cirrhosis redemonstrated. Mild dilatation of the portal vein, indicative of portal venous hypertension. Small distal esophageal varices are also noted. 3. 1.3 x 1.0 cm cystic lesion in the tail of the pancreas. This is nonspecific, but strongly favored to be benign. Repeat abdominal MRI with and without IV gadolinium with MRCP is recommended in 2 years to ensure the stability or resolution of this finding. This recommendation follows ACR consensus guidelines: Management of Incidental Pancreatic Cysts: A White Paper of the ACR Incidental Findings Committee. J Am Coll Radiol 2017;14:911-923. 4. Additional incidental findings, as above. Electronically Signed   By: Trudie Reed M.D.   On: 05/30/2016 11:43        Scheduled Meds: . insulin aspart  0-15 Units Subcutaneous TID WC  . losartan  100 mg Oral q morning - 10a  . simvastatin  40 mg Oral QPM  . warfarin  3 mg Oral ONCE-1800  . Warfarin - Pharmacist Dosing Inpatient   Does not apply q1800   Continuous Infusions:   LOS: 0 days    Time spent:     Trenese Haft, MD [facility] Pager [phone] If 7PM-7AM, please contact night-coverage [email or web address] Password [email or web address] 05/31/2016, 11:16 AM

## 2016-05-31 NOTE — Evaluation (Signed)
Occupational Therapy Evaluation Patient Details Name: [name] MRN: [id number] DOB: 1940-02-23 Today's Date: 05/31/2016    History of Present Illness Pt is a 76 yo male admitted through ED following a fall off a ladder. Pt suffered a L1 compression fracture. While in hospital, pt was found to have liver cirrhosis and possible hemangioma which will require further follow-up at DC. PMH significant for DM2, HLD, HTN, stroke.      Clinical Impression   PTA, pt living alone and independent. Currently, requires Min-Mod A for ADLs and Min A for functional mobility. Pt demonstrates poor safety awareness and requires increase cues for ADLs. Pt lethargic throughout session. Pt would benefit from continued acute OT to facilitate safe dc. Recommend dc to SNF to optimize pt independence and safety prior to transitioning home.     Follow Up Recommendations  SNF;Supervision/Assistance - 24 hour    Equipment Recommendations  Other (comment) (Defer to next venue)    Recommendations for Other Services PT consult     Precautions / Restrictions Precautions Precautions: Fall;Back Precaution Booklet Issued: Yes (comment) Precaution Comments: back precautions for safety and pain management. No ordered. Reviewed and pt requires max A for recall from previous session Required Braces or Orthoses: Spinal Brace Spinal Brace: Thoracolumbosacral orthotic;Applied in sitting position (when OOB) Restrictions Weight Bearing Restrictions: No      Mobility Bed Mobility Overal bed mobility: Needs Assistance Bed Mobility: Rolling;Sidelying to Sit;Sit to Sidelying Rolling: Mod assist Sidelying to sit: Mod assist     Sit to sidelying: Mod assist General bed mobility comments: Max verbal and tactile cues to perform log roll  Transfers Overall transfer level: Needs assistance Equipment used: Rolling walker (2 wheeled) Transfers: Sit to/from Stand Sit to Stand: Min assist         General transfer  comment: Min A to stand from EOB with brace in place.     Balance Overall balance assessment: History of Falls;Needs assistance Sitting-balance support: No upper extremity supported;Feet supported Sitting balance-Leahy Scale: Fair     Standing balance support: Bilateral upper extremity supported;During functional activity Standing balance-Leahy Scale: Poor Standing balance comment: reliant on RW for safety                           ADL either performed or assessed with clinical judgement   ADL Overall ADL's : Needs assistance/impaired Eating/Feeding: Set up;Sitting   Grooming: Min guard;Standing   Upper Body Bathing: Minimal assistance;Sitting   Lower Body Bathing: Moderate assistance;Sit to/from stand   Upper Body Dressing : Minimal assistance;Sitting Upper Body Dressing Details (indicate cue type and reason): Max A to don TLSO. Lower Body Dressing: Moderate assistance;Sit to/from stand   Toilet Transfer: Minimal assistance;Cueing for sequencing;Ambulation;Grab bars;RW;Regular Social worker and Hygiene: Min guard;Sit to/from stand;Adhering to back precautions       Functional mobility during ADLs: Minimal assistance;Rolling walker General ADL Comments: Pt demosntrated poor safety awarness and functional performance due to lethargy     Vision Baseline Vision/History: Wears glasses Wears Glasses: At all times Patient Visual Report: No change from baseline       Perception     Praxis      Pertinent Vitals/Pain Pain Assessment: Faces Faces Pain Scale: Hurts even more Pain Location: lumbar region with mobility Pain Descriptors / Indicators: Grimacing;Guarding Pain Intervention(s): Monitored during session;Premedicated before session;Repositioned     Hand Dominance Right   Extremity/Trunk Assessment Upper Extremity Assessment Upper Extremity Assessment:  Overall [facility] for tasks assessed   Lower Extremity Assessment Lower  Extremity Assessment: Generalized weakness   Cervical / Trunk Assessment Cervical / Trunk Assessment: Normal   Communication Communication Communication: Other (comment) (answered questions with delayed responses)   Cognition Arousal/Alertness: Lethargic;Suspect due to medications Behavior During Therapy: [facility] for tasks assessed/performed Overall Cognitive Status: Impaired/Different from baseline Area of Impairment: Following commands;Safety/judgement;Memory;Attention;Awareness                   Current Attention Level: Sustained Memory: Decreased short-term memory;Decreased recall of precautions Following Commands: Follows one step commands consistently Safety/Judgement: Decreased awareness of safety Awareness: Emergent   General Comments: Poor cogntition due supected due to medication   General Comments  Pt friend Darel Hong present for session    Exercises     Shoulder Instructions      Home Living Family/patient expects to be discharged to:: Private residence Living Arrangements: Alone Available Help at Discharge: Friend(s);Available PRN/intermittently Type of Home: Apartment Home Access: Stairs to enter Entrance Stairs-Number of Steps: "6 then 9" = 15 Entrance Stairs-Rails: Right Home Layout: One level     Bathroom Shower/Tub: IT trainer: Standard     Home Equipment: Grab bars - tub/shower;Hand held Careers information officer - 2 wheels          Prior Functioning/Environment Level of Independence: Independent        Comments: ADLs, IADLs, driving        OT Problem List: Decreased strength;Decreased range of motion;Decreased activity tolerance;Decreased safety awareness;Decreased knowledge of precautions;Decreased knowledge of use of DME or AE;Pain      OT Treatment/Interventions: Self-care/ADL training;Therapeutic exercise;Energy conservation;DME and/or AE instruction;Therapeutic activities;Patient/family education    OT  Goals(Current goals can be found in the care plan section) Acute Rehab OT Goals Patient Stated Goal: to have less pain OT Goal Formulation: With patient Time For Goal Achievement: 06/14/16 Potential to Achieve Goals: Good ADL Goals Pt Will Perform Upper Body Dressing: sitting;with supervision;with set-up Pt Will Perform Lower Body Dressing: with set-up;with supervision;with adaptive equipment;sit to/from stand Pt Will Transfer to Toilet: with supervision;ambulating;regular height toilet  OT Frequency: Min 2X/week   Barriers to D/C: Decreased caregiver support  lived alone       Co-evaluation              AM-PAC PT "6 Clicks" Daily Activity     Outcome Measure Help from another person eating meals?: None Help from another person taking care of personal grooming?: A Little Help from another person toileting, which includes using toliet, bedpan, or urinal?: A Little Help from another person bathing (including washing, rinsing, drying)?: A Lot Help from another person to put on and taking off regular upper body clothing?: A Little Help from another person to put on and taking off regular lower body clothing?: A Lot 6 Click Score: 17   End of Session Equipment Utilized During Treatment: Rolling walker;Back brace Nurse Communication: Mobility status  Activity Tolerance: Patient limited by lethargy Patient left: in bed;with call bell/phone within reach;with family/visitor present  OT Visit Diagnosis: Unsteadiness on feet (R26.81);History of falling (Z91.81);Muscle weakness (generalized) (M62.81);Pain Pain - Right/Left:  (Back) Pain - part of body:  (back)                Time: 9147-8295 OT Time Calculation (min): 39 min Charges:  OT General Charges $OT Visit: 1 Procedure OT Evaluation $OT Eval Moderate Complexity: 1 Procedure OT Treatments $Self Care/Home Management : 8-22 mins G-Codes: OT G-codes **  NOT FOR INPATIENT CLASS** Functional Assessment Tool Used: Clinical  judgement Functional Limitation: Self care Self Care Current Status (U1324(G8987): At least 20 percent but less than 40 percent impaired, limited or restricted Self Care Goal Status (M0102(G8988): At least 1 percent but less than 20 percent impaired, limited or restricted   [facility] Center IncCharis Shalise Rosado, OTR/L [phone]  Theodoro GristCharis M Abrianna Sidman 05/31/2016, 5:05 PM

## 2016-05-31 NOTE — Evaluation (Signed)
Physical Therapy Evaluation Patient Details Name: [name] MRN: [id number] DOB: 1940-01-12 Today's Date: 05/31/2016   History of Present Illness  Pt is a 76 yo male admitted through ED following a fall off a ladder. Pt suffered a L1 compression fracture. While in hospital, pt was found to have liver cirrhosis and possible hemangioma which will require further follow-up at DC. PMH significant for DM2, HLD, HTN, stroke.     Clinical Impression  Pt presents with the above diagnosis and below deficits for therapy evaluation. Prior to admission, pt lived alone and was completely independent. Pt had just moved into a second floor apartment. Pt has a friend who is his POA and assists PRN. Pt requires Min A for all mobility and has very poor safety awareness due to lethargy suspect due to pain medications. Pt requires MAX cues for donning and doffing TLSO and for maintain back precautions for safety. Pt will benefit from SNF at discharge in order to maximize his functional outcomes before dc home. Pt continues to require acute follow-up as needed in order to progress to DC.     Follow Up Recommendations SNF;Supervision/Assistance - 24 hour    Equipment Recommendations  None recommended by PT;Other (comment) (defer to next venue)    Recommendations for Other Services       Precautions / Restrictions Precautions Precautions: Fall;Back Precaution Booklet Issued: Yes (comment) Precaution Comments: back precautions for safety and pain management. No ordered. Reviewed and pt requires max A for recall from previous session Required Braces or Orthoses: Spinal Brace Spinal Brace: Thoracolumbosacral orthotic;Applied in sitting position (when OOB) Restrictions Weight Bearing Restrictions: No      Mobility  Bed Mobility Overal bed mobility: Needs Assistance Bed Mobility: Rolling;Sidelying to Sit;Sit to Sidelying Rolling: Min guard Sidelying to sit: Min guard     Sit to sidelying: Min  guard General bed mobility comments: Min guard for safety and max cues for performing bed mobility to maintain safe back mobility  Transfers Overall transfer level: Needs assistance Equipment used: Rolling walker (2 wheeled) Transfers: Sit to/from Stand Sit to Stand: Min assist         General transfer comment: Min A to stand from EOB with brace in place.   Ambulation/Gait Ambulation/Gait assistance: Min assist Ambulation Distance (Feet): 10 Feet Assistive device: Rolling walker (2 wheeled) Gait Pattern/deviations: Decreased step length - left;Decreased step length - right;Step-to pattern;Trunk flexed Gait velocity: decreased Gait velocity interpretation: Below normal speed for age/gender General Gait Details: increased c/o pain and fatigue with gait. Requires Min A for safety   Stairs            Wheelchair Mobility    Modified Rankin (Stroke Patients Only)       Balance Overall balance assessment: History of Falls;Needs assistance Sitting-balance support: No upper extremity supported;Feet supported Sitting balance-Leahy Scale: Fair     Standing balance support: Bilateral upper extremity supported;During functional activity Standing balance-Leahy Scale: Poor Standing balance comment: reliant on RW for safety                             Pertinent Vitals/Pain Pain Assessment: Faces Faces Pain Scale: Hurts even more Pain Location: lumbar region with mobility Pain Descriptors / Indicators: Grimacing;Guarding Pain Intervention(s): Monitored during session;Premedicated before session;Repositioned    Home Living Family/patient expects to be discharged to:: Private residence Living Arrangements: Alone Available Help at Discharge: Friend(s);Available PRN/intermittently Type of Home: Apartment Home Access: Stairs to  enter Entrance Stairs-Rails: Right Entrance Stairs-Number of Steps: "6 then 9" = 15 Home Layout: One level Home Equipment: Grab bars -  tub/shower;Hand held shower head;Walker - 2 wheels      Prior Function Level of Independence: Independent         Comments: ADLs, IADLs, driving     Hand Dominance   Dominant Hand: Right    Extremity/Trunk Assessment   Upper Extremity Assessment Upper Extremity Assessment: Defer to OT evaluation    Lower Extremity Assessment Lower Extremity Assessment: Generalized weakness    Cervical / Trunk Assessment Cervical / Trunk Assessment: Normal  Communication   Communication: Other (comment) (answered questions with delayed responses)  Cognition Arousal/Alertness: Lethargic;Suspect due to medications Behavior During Therapy: [facility] for tasks assessed/performed Overall Cognitive Status: Impaired/Different from baseline Area of Impairment: Following commands;Safety/judgement                       Following Commands: Follows one step commands consistently Safety/Judgement: Decreased awareness of safety            General Comments      Exercises     Assessment/Plan    PT Assessment Patient needs continued PT services  PT Problem List Decreased strength;Decreased activity tolerance;Decreased balance;Decreased mobility;Decreased knowledge of use of DME;Decreased safety awareness;Pain       PT Treatment Interventions DME instruction;Gait training;Functional mobility training;Therapeutic activities;Therapeutic exercise;Stair training;Balance training;Patient/family education    PT Goals (Current goals can be found in the Care Plan section)  Acute Rehab PT Goals Patient Stated Goal: to have less pain PT Goal Formulation: With patient/family Time For Goal Achievement: 06/14/16 Potential to Achieve Goals: Good    Frequency Min 2X/week   Barriers to discharge Inaccessible home environment;Decreased caregiver support pt has 15 stairs to get into his home and CG is only available very PRN    Co-evaluation               AM-PAC PT "6 Clicks" Daily  Activity  Outcome Measure Difficulty turning over in bed (including adjusting bedclothes, sheets and blankets)?: Total Difficulty moving from lying on back to sitting on the side of the bed? : Total Difficulty sitting down on and standing up from a chair with arms (e.g., wheelchair, bedside commode, etc,.)?: Total Help needed moving to and from a bed to chair (including a wheelchair)?: A Lot Help needed walking in hospital room?: A Lot Help needed climbing 3-5 steps with a railing? : A Lot 6 Click Score: 9    End of Session Equipment Utilized During Treatment: Gait belt;Back brace Activity Tolerance: Patient limited by lethargy Patient left: in bed;with call bell/phone within reach;with bed alarm set;with family/visitor present Nurse Communication: Mobility status PT Visit Diagnosis: Unsteadiness on feet (R26.81);Muscle weakness (generalized) (M62.81);Difficulty in walking, not elsewhere classified (R26.2)    Time: [id number] PT Time Calculation (min) (ACUTE ONLY): 31 min   Charges:   PT Evaluation $PT Eval Moderate Complexity: 1 Procedure PT Treatments $Therapeutic Activity: 8-22 mins   PT G Codes:   PT G-Codes **NOT FOR INPATIENT CLASS** Functional Assessment Tool Used: AM-PAC 6 Clicks Basic Mobility;Clinical judgement Functional Limitation: Mobility: Walking and moving around Mobility: Walking and Moving Around Current Status (V4098(G8978): At least 60 percent but less than 80 percent impaired, limited or restricted Mobility: Walking and Moving Around Goal Status [phone](G8979): At least 1 percent but less than 20 percent impaired, limited or restricted    Colin BroachSabra M. Joshus Rogan PT, DPT  [phone]   Ruel FavorsSabra  Aletha Halim 05/31/2016, 4:19 PM

## 2016-05-31 NOTE — Progress Notes (Signed)
ANTICOAGULATION CONSULT NOTE - Follow Up Consult  Pharmacy Consult:  Coumadin Indication: atrial fibrillation  Allergies  Allergen Reactions  . Prednisone     Other reaction(s): Other (See Comments) unknown  . Zithromax [Azithromycin] Other (See Comments)    Weak and flulike symptoms    Patient Measurements: Height: 5\' 4"  (162.6 cm) Weight: 175 lb (79.4 kg) IBW/kg (Calculated) : 59.2  Assessment: 75 YOM admitted s/p fall from ladder on Coumadin 3mg  daily PTA for hx AFib/CVA, post IVC filter 2002. INR on admit low at 1.72. INR trending up and now therapeutic at 2.18. Hgb 13, plts low but stable at 128.   Goal of Therapy:  INR 2-3   Plan:  Give Coumadin 3mg  PO daily Monitor daily INR, CBC, s/s of bleed  Enzo BiNathan Ajiah Mcglinn, PharmD, [facility] Ddc Surgery CenterBCPS Clinical Pharmacist Pager [phone] 05/31/2016 8:28 AM

## 2016-06-01 ENCOUNTER — Encounter (HOSPITAL_COMMUNITY): Payer: Self-pay | Admitting: General Practice

## 2016-06-01 DIAGNOSIS — E1121 Type 2 diabetes mellitus with diabetic nephropathy: Secondary | ICD-10-CM | POA: Diagnosis not present

## 2016-06-01 DIAGNOSIS — M4856XA Collapsed vertebra, not elsewhere classified, lumbar region, initial encounter for fracture: Secondary | ICD-10-CM | POA: Diagnosis not present

## 2016-06-01 DIAGNOSIS — S32010D Wedge compression fracture of first lumbar vertebra, subsequent encounter for fracture with routine healing: Secondary | ICD-10-CM

## 2016-06-01 LAB — GLUCOSE, CAPILLARY
GLUCOSE-CAPILLARY: 134 mg/dL — AB (ref 65–99)
GLUCOSE-CAPILLARY: 139 mg/dL — AB (ref 65–99)
Glucose-Capillary: 153 mg/dL — ABNORMAL HIGH (ref 65–99)

## 2016-06-01 LAB — PROTIME-INR
INR: 2.33
PROTHROMBIN TIME: 26 s — AB (ref 11.4–15.2)
PROTIME: 26 s — AB (ref 10.0–13.8)

## 2016-06-01 LAB — HEPATITIS PANEL, ACUTE
HEP A IGM: NEGATIVE
HEP B C IGM: NEGATIVE
Hepatitis B Surface Ag: NEGATIVE

## 2016-06-01 MED ORDER — POLYETHYLENE GLYCOL 3350 17 G PO PACK: 17.0000 g | PACK | Freq: Every day | ORAL | 0 refills | Status: DC | PRN

## 2016-06-01 MED ORDER — WARFARIN SODIUM 3 MG PO TABS
3.0000 mg | ORAL_TABLET | Freq: Once | ORAL | Status: DC
  Filled 2016-06-01: qty 1

## 2016-06-01 MED ORDER — OXYCODONE HCL 5 MG PO TABS: 5.0000 mg | ORAL_TABLET | ORAL | 0 refills | Status: DC | PRN

## 2016-06-01 MED ORDER — METHOCARBAMOL 500 MG PO TABS: 500.0000 mg | ORAL_TABLET | Freq: Four times a day (QID) | ORAL | 1 refills | Status: DC | PRN

## 2016-06-01 NOTE — Progress Notes (Signed)
Physical Therapy Treatment Patient Details Name: Lilyan GilfordJackie L Gacek MRN: [id number] DOB: [birth year] Today's Date: 06/01/2016    History of Present Illness Pt is a 76 yo male admitted through ED following a fall off a ladder. Pt suffered a L1 compression fracture. While in hospital, pt was found to have liver cirrhosis and possible hemangioma which will require further follow-up at DC. PMH significant for DM2, HLD, HTN, stroke.       PT Comments    The patient is more alert today but has  Not had pain medication x 5 hours. The patient requires hand over hand assistance to don the TLSO. The patient lives alone and will have difficulties donning brace until more practice. The patient  Exhibits short term memory deficits, requiring cues for precautions for safety, bed mobility techniques. . Concern for patient being safe  Alone  Using RW and donning doffing brace, being able to get meals safely. Strongly recommend SNF  To improve safety and  Mobility.  Follow Up Recommendations  SNF;Supervision/Assistance - 24 hour     Equipment Recommendations  None recommended by PT;Other (comment)    Recommendations for Other Services       Precautions / Restrictions Precautions Precautions: Fall;Back Precaution Booklet Issued: Yes (comment) Precaution Comments: back precautions  reviewed several times, decreased recall for stating  back. Required Braces or Orthoses: Spinal Brace Spinal Brace: Thoracolumbosacral orthotic;Applied in sitting position Restrictions Other Position/Activity Restrictions: the patient found sitting on the bed edge without TLSO. Assisted with getting brace together and mulimodal cues to place the brace, attatch the buckles and axillary straps and pull the cords to tighten. After ambualtion, verbal and demostration instruction  in removal of brace , attaching  velcro straps in order to simplify placing brace next time up.    Mobility  Bed Mobility Overal bed mobility: Needs  Assistance Bed Mobility: Rolling;Sit to Supine Rolling: Min assist       Sit to sidelying: Min assist General bed mobility comments: max verbal cues for  safety and precautions to go to his side and bring legs up.  Transfers Overall transfer level: Needs assistance Equipment used: Rolling walker (2 wheeled) Transfers: Sit to/from Stand Sit to Stand: Min assist         General transfer comment: Min A to stand from EOB with brace in place.   Ambulation/Gait Ambulation/Gait assistance: Min assist Ambulation Distance (Feet): 100 Feet (x 2) Assistive device: Rolling walker (2 wheeled) Gait Pattern/deviations: Step-through pattern;Decreased stride length   Gait velocity interpretation: Below normal speed for age/gender General Gait Details: multimodal cues for safety, pushes RW too far ahead, Required rest breaks x 3   Stairs Stairs: Yes   Stair Management: Forwards;One rail Right Number of Stairs: 3 General stair comments: frequent cues for safety, taking 1 step at a time and using 1 rail.  Wheelchair Mobility    Modified Rankin (Stroke Patients Only)       Balance           Standing balance support: Bilateral upper extremity supported;During functional activity Standing balance-Leahy Scale: Fair                              Cognition Arousal/Alertness: Awake/alert Behavior During Therapy: WFL for tasks assessed/performed Overall Cognitive Status: Impaired/Different from baseline Area of Impairment: Following commands;Safety/judgement;Memory;Attention;Awareness                   Current Attention Level: Sustained  Memory: Decreased short-term memory;Decreased recall of precautions Following Commands: Follows one step commands consistently Safety/Judgement: Decreased awareness of safety Awareness: Emergent   General Comments: patient did not recall how to don the brace, it was in several pieces Therapist  placed brace back together and  demonstrated  to patient  the steps for applying the brace. The patient required hand over hand assistance for all aspects of brace.      Exercises      General Comments        Pertinent Vitals/Pain Pain Assessment: 0-10 Pain Score: 5  Pain Location: back Pain Descriptors / Indicators: Aching Pain Intervention(s): Patient requesting pain meds-RN notified;Monitored during session    Home Living                      Prior Function            PT Goals (current goals can now be found in the care plan section) Progress towards PT goals: Progressing toward goals    Frequency    Min 2X/week      PT Plan Current plan remains appropriate    Co-evaluation              AM-PAC PT "6 Clicks" Daily Activity  Outcome Measure  Difficulty turning over in bed (including adjusting bedclothes, sheets and blankets)?: A Little Difficulty moving from lying on back to sitting on the side of the bed? : A Little Difficulty sitting down on and standing up from a chair with arms (e.g., wheelchair, bedside commode, etc,.)?: A Little Help needed moving to and from a bed to chair (including a wheelchair)?: A Little Help needed walking in hospital room?: A Little Help needed climbing 3-5 steps with a railing? : A Lot 6 Click Score: 17    End of Session Equipment Utilized During Treatment: Back brace Activity Tolerance: Patient tolerated treatment well Patient left: in bed;with call bell/phone within reach Nurse Communication: Mobility status PT Visit Diagnosis: Unsteadiness on feet (R26.81);Muscle weakness (generalized) (M62.81);Difficulty in walking, not elsewhere classified (R26.2)     Time: 1610-9604 PT Time Calculation (min) (ACUTE ONLY): 37 min  Charges:  $Gait Training: 8-22 mins $Self Care/Home Management: 8-22                    G CodesBlanchard Kelch PT [phone]    Rada Hay 06/01/2016, 2:24 PM

## 2016-06-01 NOTE — Clinical Social Work Placement (Signed)
   CLINICAL SOCIAL WORK PLACEMENT  NOTE  Date:  06/01/2016  Patient Details  Name: [name] MRN: [id number] Date of Birth: [birth year]  Clinical Social Work is seeking post-discharge placement for this patient at the Skilled  Nursing Facility level of care (*CSW will initial, date and re-position this form in  chart as items are completed):  Yes   Patient/family provided with [facility] Social Work Department's list of facilities offering this level of care within the geographic area requested by the patient (or if unable, by the patient's family).  Yes   Patient/family informed of their freedom to choose among providers that offer the needed level of care, that participate in Medicare, Medicaid or managed care program needed by the patient, have an available bed and are willing to accept the patient.  Yes   Patient/family informed of [facility] ownership interest in [facility] and [facility], as well as of the fact that they are under no obligation to receive care at these facilities.  PASRR submitted to EDS on       PASRR number received on 06/01/16     Existing PASRR number confirmed on 06/01/16     FL2 transmitted to all facilities in geographic area requested by pt/family on 06/01/16     FL2 transmitted to all facilities within larger geographic area on 06/01/16     Patient informed that his/her managed care company has contracts with or will negotiate with certain facilities, including the following:        Yes   Patient/family informed of bed offers received.  Patient [facility] bed at [facility]     Physician recommends and patient chooses bed at      Patient to be transferred to [facility] on 06/01/16.  Patient to be transferred to facility by [facility]     Patient family notified on 06/01/16 of transfer.  Name of family member notified:  Patient makes all decsions and will f/u with family on placement      PHYSICIAN Please prepare priority discharge summary, including medications, Please prepare prescriptions, Please sign FL2, Please sign DNR     Additional Comment:    _______________________________________________ Dustin MoorePatricia V Aizlyn Schifano, LCSW 06/01/2016, 2:33 PM

## 2016-06-01 NOTE — Progress Notes (Signed)
PROGRESS NOTE    Dustin GilfordJackie L Baller  ZOX:[id number] DOB: 06-Sep-1940 DOA: 05/28/2016 PCP: Daisy Florooss, Charles Alan, MD    Brief Narrative: [name] is a 76 y.o. male  with past medical history of diabetes, hyperlipidemia, hypertension, kidney stones, stroke since to the emergency room after falling off a ladder. He was found to have a mild L1 fracture. A CT abd and pelvis shows liver cirrhosis and a 2.4 hypoechoic mass. MRI with and without contrast ordered for further evaluation.   Assessment & Plan:   Compression fracture of the L1 vertebra:  -continue Brace -PT consult-SNF  -change IV dilaudid to oral oxycodone    Hypertension: well controlled.   Diabetes Mellitus:  -stable,  Continue SSI  Dyslipidemia-continue Zocor   Mild thrombocytopenia: possibly from liver cirrhosis.  -stable  Liver cirrhosis of unclear etiology./Liver mass  atypical hemangioma? Pt was not aware of the liver cirrhosis until this admission.  A 2.4 hypoechoic mass found in the liver,  MRI of the abdomen suggests this is a hemangioma, needs FUIn the outpatient setting to be arranged for by PCP   Probable pancreatic cyst  1.3 x 1.0 cm cystic lesion in the tail of the pancreas. This is nonspecific, but strongly favored to be benign.  -needs FU MRI down the road  H/o Afib/ stroke: on coumadin. INR therapeutic, rate controlled,   DVT prophylaxis: coumadin Code Status: DNR  Family Communication: none at bedside.  Disposition Plan:  Refused SNF  Consultants:   None.    Procedures: MRI of the abdomen pending.    Antimicrobials:none.    Subjective: Still having back pain, would like to go home   Objective: Vitals:   05/31/16 1700 05/31/16 2209 06/01/16 0500 06/01/16 0832  BP: (!) 151/67 (!) 148/78 (!) 147/70 (!) 152/75  Pulse: 85 88 72 79  Resp: 16   18  Temp: 98.9 F (37.2 C) 98.8 F (37.1 C) 98.3 F (36.8 C) 98.2 F (36.8 C)  TempSrc: Oral Oral Oral Oral  SpO2: 95% 94% 99% 96%  Weight:       Height:        Intake/Output Summary (Last 24 hours) at 06/01/16 1012 Last data filed at 05/31/16 1700  Gross per 24 hour  Intake              480 ml  Output                0 ml  Net              480 ml   Filed Weights   05/28/16 1009  Weight: 79.4 kg (175 lb)    Examination:  General exam: Alert, awake, oriented x2 Respiratory system: decreased BS , no rales or wheezes Cardiovascular system: S1 & S2 heard, RRR. No JVD, murmurs,  Gastrointestinal system: Abdomen is slightly distended. , soft and nontender. No organomegaly or masses felt. Normal bowel sounds heard. Braces noted Central nervous system: Alert and oriented. No focal neurological deficits. Extremities: Symmetric 5 x 5 power. Skin: No rashes, lesions or ulcers     Data Reviewed: I have personally reviewed following labs and imaging studies  CBC:  Recent Labs Lab 05/28/16 1059 05/29/16 0638 05/31/16 0352  WBC  --  7.2 7.7  HGB 13.6 12.5* 13.0  HCT 40.0 37.1* 39.1  MCV  --  93.2 92.7  PLT  --  119* 128*   Basic Metabolic Panel:  Recent Labs Lab 05/28/16 1059 05/29/16 [id number] 05/31/16 0352  NA 137 136 136  K 4.2 3.8 3.6  CL 105 105 104  CO2  --  22 23  GLUCOSE 203* 130* 150*  BUN 15 14 13   CREATININE 0.80 0.92 0.74  CALCIUM  --  8.7* 8.8*   GFR: Estimated Creatinine Clearance: 77.1 mL/min (by C-G formula based on SCr of 0.74 mg/dL). Liver Function Tests: No results for input(s): AST, ALT, ALKPHOS, BILITOT, PROT, ALBUMIN in the last 168 hours. No results for input(s): LIPASE, AMYLASE in the last 168 hours. No results for input(s): AMMONIA in the last 168 hours. Coagulation Profile:  Recent Labs Lab 05/28/16 1040 05/29/16 1610 05/30/16 0410 05/31/16 0352 06/01/16 0617  INR 1.75 1.92 2.20 2.18 2.33   Cardiac Enzymes: No results for input(s): CKTOTAL, CKMB, CKMBINDEX, TROPONINI in the last 168 hours. BNP (last 3 results) No results for input(s): PROBNP in the last 8760  hours. HbA1C: No results for input(s): HGBA1C in the last 72 hours. CBG:  Recent Labs Lab 05/31/16 1210 05/31/16 1652 05/31/16 2208 06/01/16 0609 06/01/16 0646  GLUCAP 104* 104* 133* 134* 139*   Lipid Profile: No results for input(s): CHOL, HDL, LDLCALC, TRIG, CHOLHDL, LDLDIRECT in the last 72 hours. Thyroid Function Tests: No results for input(s): TSH, T4TOTAL, FREET4, T3FREE, THYROIDAB in the last 72 hours. Anemia Panel: No results for input(s): VITAMINB12, FOLATE, FERRITIN, TIBC, IRON, RETICCTPCT in the last 72 hours. Sepsis Labs: No results for input(s): PROCALCITON, LATICACIDVEN in the last 168 hours.  Recent Results (from the past 240 hour(s))  Urine culture     Status: Abnormal   Collection Time: 05/28/16 11:15 AM  Result Value Ref Range Status   Specimen Description URINE, RANDOM  Final   Special Requests NONE  Final   Culture MULTIPLE SPECIES PRESENT, SUGGEST RECOLLECTION (A)  Final   Report Status 05/29/2016 FINAL  Final         Radiology Studies: No results found.      Scheduled Meds: . insulin aspart  0-15 Units Subcutaneous TID WC  . losartan  100 mg Oral q morning - 10a  . simvastatin  40 mg Oral QPM  . warfarin  3 mg Oral ONCE-1800  . Warfarin - Pharmacist Dosing Inpatient   Does not apply q1800   Continuous Infusions:   LOS: 0 days    Time spent:     Richarda Overlie, MD [facility] Pager [phone] If 7PM-7AM, please contact night-coverage [email or web address] Password [email or web address] 06/01/2016, 10:12 AM

## 2016-06-01 NOTE — Discharge Summary (Signed)
Physician Discharge Summary  [name] MRN: [id number] DOB/AGE: Jul 12, 1940 76 y.o.  PCP: Lawerance Cruel, MD   Admit date: 05/28/2016 Discharge date: 06/01/2016  Discharge Diagnoses:    Principal Problem:   Closed compression fracture of L1 lumbar vertebra ([place]) Active Problems:   HTN (hypertension)   DM II (diabetes mellitus, type II), controlled ([place])   Closed compression fracture of body of lumbar vertebra ([place])    Follow-up recommendations Follow-up with PCP in 3-5 days , including all  additional recommended appointments as below Follow-up CBC, CMP in 3-5 days Patient being discharged with home health as he refuses SNF      Current Discharge Medication List    START taking these medications   Details  methocarbamol (ROBAXIN) 500 MG tablet Take 1 tablet (500 mg total) by mouth every 6 (six) hours as needed for muscle spasms. Qty: 30 tablet, Refills: 1    oxyCODONE (OXY IR/ROXICODONE) 5 MG immediate release tablet Take 1 tablet (5 mg total) by mouth every 4 (four) hours as needed for severe pain. Qty: 30 tablet, Refills: 0    polyethylene glycol (MIRALAX / GLYCOLAX) packet Take 17 g by mouth daily as needed for mild constipation. Qty: 14 each, Refills: 0      CONTINUE these medications which have NOT CHANGED   Details  glimepiride (AMARYL) 4 MG tablet Take 4 mg by mouth every morning.    losartan (COZAAR) 100 MG tablet Take 100 mg by mouth every morning.     metFORMIN (GLUCOPHAGE) 500 MG tablet Take 1,000 mg by mouth 2 (two) times daily.     pioglitazone (ACTOS) 30 MG tablet Take 30 mg by mouth daily. Refills: 1    simvastatin (ZOCOR) 40 MG tablet Take 40 mg by mouth every evening.    warfarin (COUMADIN) 2 MG tablet Take 3 mg by mouth daily at 6 PM.          Discharge Condition:  Stable   Discharge Instructions Get Medicines reviewed and adjusted: Please take all your medications with you for your next visit with your Primary MD  Please  request your Primary MD to go over all hospital tests and procedure/radiological results at the follow up, please ask your Primary MD to get all Hospital records sent to his/her office.  If you experience worsening of your admission symptoms, develop shortness of breath, life threatening emergency, suicidal or homicidal thoughts you must seek medical attention immediately by calling 911 or calling your MD immediately if symptoms less severe.  You must read complete instructions/literature along with all the possible adverse reactions/side effects for all the Medicines you take and that have been prescribed to you. Take any new Medicines after you have completely understood and accpet all the possible adverse reactions/side effects.   Do not drive when taking Pain medications.   Do not take more than prescribed Pain, Sleep and Anxiety Medications  Special Instructions: If you have smoked or chewed Tobacco in the last 2 yrs please stop smoking, stop any regular Alcohol and or any Recreational drug use.  Wear Seat belts while driving.  Please note  You were cared for by a hospitalist during your hospital stay. Once you are discharged, your primary care physician will handle any further medical issues. Please note that NO REFILLS for any discharge medications will be authorized once you are discharged, as it is imperative that you return to your primary care physician (or establish a relationship with a primary care physician if you  do not have one) for your aftercare needs so that they can reassess your need for medications and monitor your lab values.  Discharge Instructions    Diet - low sodium heart healthy    Complete by:  As directed    Increase activity slowly    Complete by:  As directed        Allergies  Allergen Reactions  . Prednisone     Other reaction(s): Other (See Comments) unknown  . Zithromax [Azithromycin] Other (See Comments)    Weak and flulike symptoms       Disposition: 01-Home or Self Care   Consults:  None    Significant Diagnostic Studies:  Dg Chest 2 View  Result Date: 05/28/2016 CLINICAL DATA:  76 year old male with a history of fall from ladder EXAM: CHEST  2 VIEW COMPARISON:  08/27/2011, 01/31/2010 FINDINGS: Cardiomediastinal silhouette unchanged in size and contour. Calcifications of the aortic arch. No pneumothorax. No pleural effusion. Similar appearance of coarsened interstitial markings when compared to prior plain films. No confluent airspace disease. Lateral view demonstrates new compression fracture of L1 which was not present on a remote CT dated 06/13/2009. Multilevel degenerative changes of the visualized spine. IMPRESSION: Low lung volumes and chronic lung changes without evidence of superimposed acute cardiopulmonary disease. Lateral view of the chest incidentally image is a new compression fracture of L1. Comparison study is dated 06/13/2009. Chronicity is indeterminate, and if there is concern for acute pain at this level and possibility of acute fracture, plain film series may be useful. Aortic atherosclerosis. Electronically Signed   By: Corrie Mckusick D.O.   On: 05/28/2016 11:40   Dg Ankle Complete Right  Result Date: 05/28/2016 CLINICAL DATA:  Fall from ladder with right ankle pain, initial encounter EXAM: RIGHT ANKLE - COMPLETE 3+ VIEW COMPARISON:  None. FINDINGS: There is no evidence of fracture, dislocation, or joint effusion. There is no evidence of arthropathy or other focal bone abnormality. Soft tissues are unremarkable. IMPRESSION: No acute abnormality noted. Electronically Signed   By: Inez Catalina M.D.   On: 05/28/2016 11:38   Ct Head Wo Contrast  Result Date: 05/29/2016 CLINICAL DATA:  Status post fall backwards off 2-step ladder, with concern for head or cervical spine injury. Initial encounter. EXAM: CT HEAD WITHOUT CONTRAST CT CERVICAL SPINE WITHOUT CONTRAST TECHNIQUE: Multidetector CT imaging of the  head and cervical spine was performed following the standard protocol without intravenous contrast. Multiplanar CT image reconstructions of the cervical spine were also generated. COMPARISON:  None. FINDINGS: CT HEAD FINDINGS Brain: No evidence of acute infarction, hemorrhage, hydrocephalus, extra-axial collection or mass lesion/mass effect. Prominence of the ventricles and sulci reflects mild to moderate cortical volume loss. Chronic encephalomalacia is noted at the left occipital lobe, reflecting remote infarct. Mild cerebellar atrophy is noted. Small chronic infarcts are noted at the left cerebellar hemisphere. A small chronic lacunar infarct is noted at the right basal ganglia. Mild periventricular white matter change likely reflects small vessel ischemic microangiopathy. The brainstem and fourth ventricle are within normal limits. No mass effect or midline shift is seen. Vascular: No hyperdense vessel or unexpected calcification. Skull: There is no evidence of fracture; visualized osseous structures are unremarkable in appearance. Sinuses/Orbits: The orbits are within normal limits. There is mild partial opacification of the left maxillary sinus. The remaining paranasal sinuses and mastoid air cells are well-aerated. Other: No significant soft tissue abnormalities are seen. CT CERVICAL SPINE FINDINGS Alignment: Normal. Skull base and vertebrae: No acute fracture. No primary  bone lesion or focal pathologic process. Soft tissues and spinal canal: No prevertebral fluid or swelling. No visible canal hematoma. Disc levels: There is mild disc space narrowing at C3-C4, with scattered anterior and posterior disc osteophyte complexes along the cervical spine. Upper chest: The visualized lung apices are clear. The thyroid gland is unremarkable in appearance. Other: No additional soft tissue abnormalities are seen. IMPRESSION: 1. No evidence of traumatic intracranial injury or fracture. 2. No evidence of fracture or  subluxation along the cervical spine. 3. Mild to moderate cortical volume loss and scattered small vessel ischemic microangiopathy. 4. Chronic encephalomalacia at the left occipital lobe, reflecting remote infarct. Small chronic infarcts at the left cerebellar hemisphere, and small chronic lacunar infarct at the right basal ganglia. 5. Mild partial opacification of the left maxillary sinus. 6. Mild degenerative change along the cervical spine. Electronically Signed   By: Garald Balding M.D.   On: 05/29/2016 02:05   Ct Cervical Spine Wo Contrast  Result Date: 05/29/2016 CLINICAL DATA:  Status post fall backwards off 2-step ladder, with concern for head or cervical spine injury. Initial encounter. EXAM: CT HEAD WITHOUT CONTRAST CT CERVICAL SPINE WITHOUT CONTRAST TECHNIQUE: Multidetector CT imaging of the head and cervical spine was performed following the standard protocol without intravenous contrast. Multiplanar CT image reconstructions of the cervical spine were also generated. COMPARISON:  None. FINDINGS: CT HEAD FINDINGS Brain: No evidence of acute infarction, hemorrhage, hydrocephalus, extra-axial collection or mass lesion/mass effect. Prominence of the ventricles and sulci reflects mild to moderate cortical volume loss. Chronic encephalomalacia is noted at the left occipital lobe, reflecting remote infarct. Mild cerebellar atrophy is noted. Small chronic infarcts are noted at the left cerebellar hemisphere. A small chronic lacunar infarct is noted at the right basal ganglia. Mild periventricular white matter change likely reflects small vessel ischemic microangiopathy. The brainstem and fourth ventricle are within normal limits. No mass effect or midline shift is seen. Vascular: No hyperdense vessel or unexpected calcification. Skull: There is no evidence of fracture; visualized osseous structures are unremarkable in appearance. Sinuses/Orbits: The orbits are within normal limits. There is mild partial  opacification of the left maxillary sinus. The remaining paranasal sinuses and mastoid air cells are well-aerated. Other: No significant soft tissue abnormalities are seen. CT CERVICAL SPINE FINDINGS Alignment: Normal. Skull base and vertebrae: No acute fracture. No primary bone lesion or focal pathologic process. Soft tissues and spinal canal: No prevertebral fluid or swelling. No visible canal hematoma. Disc levels: There is mild disc space narrowing at C3-C4, with scattered anterior and posterior disc osteophyte complexes along the cervical spine. Upper chest: The visualized lung apices are clear. The thyroid gland is unremarkable in appearance. Other: No additional soft tissue abnormalities are seen. IMPRESSION: 1. No evidence of traumatic intracranial injury or fracture. 2. No evidence of fracture or subluxation along the cervical spine. 3. Mild to moderate cortical volume loss and scattered small vessel ischemic microangiopathy. 4. Chronic encephalomalacia at the left occipital lobe, reflecting remote infarct. Small chronic infarcts at the left cerebellar hemisphere, and small chronic lacunar infarct at the right basal ganglia. 5. Mild partial opacification of the left maxillary sinus. 6. Mild degenerative change along the cervical spine. Electronically Signed   By: Garald Balding M.D.   On: 05/29/2016 02:05   Mr Abdomen W Wo Contrast  Result Date: 05/30/2016 CLINICAL DATA:  76 year old male with history of diabetes, hyperlipidemia and hypertension with history of trauma from a recent fall off of a ladder. L1 vertebral  compression fracture. Indeterminate liver lesion noted on prior CT examination. EXAM: MRI ABDOMEN WITHOUT AND WITH CONTRAST TECHNIQUE: Multiplanar multisequence MR imaging of the abdomen was performed both before and after the administration of intravenous contrast. CONTRAST:  47m MULTIHANCE GADOBENATE DIMEGLUMINE 529 MG/ML IV SOLN COMPARISON:  No prior abdominal MRI.  Abdominal CT scan  05/28/2016. FINDINGS: Lower chest: Unremarkable. Hepatobiliary: Liver has a shrunken appearance and nodular contour with hypertrophy of the caudate lobe, indicative of underlying cirrhosis. In the lateral aspect of segment 7 of the liver (image 13 of series 5) there is a 2.2 x 2.3 cm lesion that is T1 hypointense, slightly T2 hyperintense, does not restrict diffusion, is hypovascular, but demonstrates progressive delayed enhancement (without characteristic early peripheral nodular enhancement and centripetal filling as seen with most hemangiomas). 8 mm T1 hypointense, T2 hyperintense, nonenhancing lesion in the inferior aspect of segment 3 of the liver, compatible with a tiny simple cysts. No other suspicious hepatic lesions are noted. No intra or extrahepatic biliary ductal dilatation. Gallbladder is normal in appearance. Pancreas: In the tail of the pancreas there is a 1.3 x 1.0 cm well-defined T1 hypointense, T2 hyperintense lesion which does not restrict diffusion, and demonstrates no internal enhancement on post gadolinium imaging, favored to represent a tiny pancreatic pseudocyst. No other larger more suspicious appearing pancreatic mass. No pancreatic ductal dilatation. No pancreatic or peripancreatic fluid or inflammatory changes. Spleen:  Unremarkable. Adrenals/Urinary Tract: 3.1 cm exophytic T1 hypointense, T2 hyperintense, nonenhancing lesion in the lower pole of the left kidney compatible with a simple cyst. No other suspicious renal lesions. No hydroureteronephrosis in the visualized abdomen. Bilateral adrenal glands are normal in appearance. Stomach/Bowel: Visualized portions are unremarkable. Vascular/Lymphatic: No aneurysm identified in the visualized abdominal vasculature. Portal vein is mildly dilated measuring 16 mm in diameter, suggesting underlying portal hypertension. Several small distal esophageal varices are noted. IVC filter in position with tip terminating immediately below the level of  the renal veins. No lymphadenopathy noted in the abdomen. Other: No significant volume of ascites in the visualized portions of the peritoneal cavity. Musculoskeletal: No aggressive osseous lesions are noted in the visualized portions of the skeleton. Compression fracture of superior endplate of L1 again noted, with very mild compression on the left side (approximately 10%). IMPRESSION: 1. The lesion of concern in segment 7 of the liver has indeterminate imaging characteristics. Overall, this lesion is favored to be benign, likely an atypical hemangioma, however, a repeat MRI of the abdomen with and without IV gadolinium is recommended in 3 months to ensure the stability of this lesion. 2. Stigmata of cirrhosis redemonstrated. Mild dilatation of the portal vein, indicative of portal venous hypertension. Small distal esophageal varices are also noted. 3. 1.3 x 1.0 cm cystic lesion in the tail of the pancreas. This is nonspecific, but strongly favored to be benign. Repeat abdominal MRI with and without IV gadolinium with MRCP is recommended in 2 years to ensure the stability or resolution of this finding. This recommendation follows ACR consensus guidelines: Management of Incidental Pancreatic Cysts: A White Paper of the ACR Incidental Findings Committee. [id number];[id number] 4. Additional incidental findings, as above. Electronically Signed   By: DVinnie LangtonM.D.   On: 05/30/2016 11:43   Ct Abdomen Pelvis W Contrast  Result Date: 05/28/2016 CLINICAL DATA:  Fall from ladder this morning. Low back pain. History of ventral hernia repair. EXAM: CT ABDOMEN AND PELVIS WITH CONTRAST TECHNIQUE: Multidetector CT imaging of the abdomen and pelvis was performed  using the standard protocol following bolus administration of intravenous contrast. CONTRAST:  117m ISOVUE-300 IOPAMIDOL (ISOVUE-300) INJECTION 61% COMPARISON:  01/16/2016 abdominal radiograph. 07/12/2009 CT abdomen/pelvis. FINDINGS: Lower chest:  No significant pulmonary nodules or acute consolidative airspace disease. Coronary atherosclerosis. Hepatobiliary: Liver surface is diffusely irregular with relative hypertrophy of the lateral segment left liver and caudate lobes, compatible cirrhosis. Peripheral segment 7 right liver lobe 2.8 x 2.4 cm hypoenhancing mass (series 3/ image 21). subcentimeter hypodense lateral segment left liver lobe lesion. No additional liver lesions. No liver laceration. Normal gallbladder with no radiopaque cholelithiasis. No biliary ductal dilatation. Pancreas: Heterogeneous fatty infiltration of the pancreas. Cystic 1.2 cm pancreatic tail lesion (series 3/image 28). No additional pancreatic lesions. No pancreatic duct dilation. Scattered calcifications throughout the pancreas are favored to represent vascular calcifications. Spleen: Normal size. No laceration or mass. Adrenals/Urinary Tract: Normal adrenals. No hydronephrosis. No renal laceration. Indeterminate hypodense partially calcified 2.6 x 1.8 cm posterior interpolar left renal lesion (series 3/ image 42). Simple 3.4 cm anterior lower left renal cyst. Nonobstructing 9 mm interpolar and 9 mm lower right renal stones. Nonobstructing 11 mm lower left renal stone. Normal bladder. Stomach/Bowel: Grossly normal stomach. Normal caliber small bowel with no small bowel wall thickening. Appendectomy. Normal large bowel with no diverticulosis, large bowel wall thickening or pericolonic fat stranding. Vascular/Lymphatic: Atherosclerotic nonaneurysmal abdominal aorta. IVC filter is in place below the level of the renal veins. Patent portal, splenic and renal veins. Small gastroesophageal and paraumbilical varices. No pathologically enlarged lymph nodes in the abdomen or pelvis. Reproductive: Moderately enlarged prostate with nonspecific internal prostatic calcifications. Other: No pneumoperitoneum, ascites or focal fluid collection. No evidence of a recurrent ventral abdominal hernia  status post mesh repair. Musculoskeletal: No aggressive appearing focal osseous lesions. Mild compression fracture of the superior L1 vertebral body with minimal 20% loss of vertebral body height, probably acute. No additional fracture. Moderate thoracolumbar spondylosis. IMPRESSION: 1. Mild L1 vertebral compression fracture, probably acute. No additional acute traumatic injury in the abdomen or pelvis. 2. Cirrhosis. Hypoenhancing 2.8 cm segment 7 right liver lobe mass, cannot exclude hepatocellular carcinoma. MRI abdomen without and with IV contrast recommended on a short term outpatient basis for further characterization. 3. Small gastroesophageal and paraumbilical varices. No ascites. Normal size spleen. 4. Cystic 1.2 cm pancreatic tail lesion, which can also be further characterized at MRI abdomen without and with IV contrast. 5. Indeterminate low-attenuation posterior interpolar left renal mass, which can also be further characterized at MRI abdomen without and with IV contrast. 6. Additional findings include aortic atherosclerosis, coronary atherosclerosis, nonobstructing bilateral nephrolithiasis and moderate prostatomegaly. Electronically Signed   By: JIlona SorrelM.D.   On: 05/28/2016 12:25    ec    Filed Weights   05/28/16 1009  Weight: 79.4 kg (175 lb)     Microbiology: Recent Results (from the past 240 hour(s))  Urine culture     Status: Abnormal   Collection Time: 05/28/16 11:15 AM  Result Value Ref Range Status   Specimen Description URINE, RANDOM  Final   Special Requests NONE  Final   Culture MULTIPLE SPECIES PRESENT, SUGGEST RECOLLECTION (A)  Final   Report Status 05/29/2016 FINAL  Final       Blood Culture    Component Value Date/Time   SDES URINE, RANDOM 05/28/2016 1115   SPECREQUEST NONE 05/28/2016 1115   CULT MULTIPLE SPECIES PRESENT, SUGGEST RECOLLECTION (A) 05/28/2016 1115   REPTSTATUS 05/29/2016 FINAL 05/28/2016 1115      Labs: Results for  orders placed or  performed during the hospital encounter of 05/28/16 (from the past 48 hour(s))  Glucose, capillary     Status: Abnormal   Collection Time: 05/30/16 11:59 AM  Result Value Ref Range   Glucose-Capillary 146 (H) 65 - 99 mg/dL  Glucose, capillary     Status: Abnormal   Collection Time: 05/30/16  4:56 PM  Result Value Ref Range   Glucose-Capillary 137 (H) 65 - 99 mg/dL  Glucose, capillary     Status: Abnormal   Collection Time: 05/30/16 10:52 PM  Result Value Ref Range   Glucose-Capillary 135 (H) 65 - 99 mg/dL  Protime-INR     Status: Abnormal   Collection Time: 05/31/16  3:52 AM  Result Value Ref Range   Prothrombin Time 24.6 (H) 11.4 - 15.2 seconds   INR 2.18   CBC     Status: Abnormal   Collection Time: 05/31/16  3:52 AM  Result Value Ref Range   WBC 7.7 4.0 - 10.5 K/uL   RBC 4.22 4.22 - 5.81 MIL/uL   Hemoglobin 13.0 13.0 - 17.0 g/dL   HCT 39.1 39.0 - 52.0 %   MCV 92.7 78.0 - 100.0 fL   MCH 30.8 26.0 - 34.0 pg   MCHC 33.2 30.0 - 36.0 g/dL   RDW 15.0 11.5 - 15.5 %   Platelets 128 (L) 150 - 400 K/uL  Basic metabolic panel     Status: Abnormal   Collection Time: 05/31/16  3:52 AM  Result Value Ref Range   Sodium 136 135 - 145 mmol/L   Potassium 3.6 3.5 - 5.1 mmol/L   Chloride 104 101 - 111 mmol/L   CO2 23 22 - 32 mmol/L   Glucose, Bld 150 (H) 65 - 99 mg/dL   BUN 13 6 - 20 mg/dL   Creatinine, Ser 0.74 0.61 - 1.24 mg/dL   Calcium 8.8 (L) 8.9 - 10.3 mg/dL   GFR calc non Af Amer >60 >60 mL/min   GFR calc Af Amer >60 >60 mL/min    Comment: (NOTE) The eGFR has been calculated using the CKD EPI equation. This calculation has not been validated in all clinical situations. eGFR's persistently <60 mL/min signify possible Chronic Kidney Disease.    Anion gap 9 5 - 15  Glucose, capillary     Status: Abnormal   Collection Time: 05/31/16  6:10 AM  Result Value Ref Range   Glucose-Capillary 153 (H) 65 - 99 mg/dL  Glucose, capillary     Status: Abnormal   Collection Time: 05/31/16  12:10 PM  Result Value Ref Range   Glucose-Capillary 104 (H) 65 - 99 mg/dL  Glucose, capillary     Status: Abnormal   Collection Time: 05/31/16  4:52 PM  Result Value Ref Range   Glucose-Capillary 104 (H) 65 - 99 mg/dL  Glucose, capillary     Status: Abnormal   Collection Time: 05/31/16 10:08 PM  Result Value Ref Range   Glucose-Capillary 133 (H) 65 - 99 mg/dL  Glucose, capillary     Status: Abnormal   Collection Time: 06/01/16  6:09 AM  Result Value Ref Range   Glucose-Capillary 134 (H) 65 - 99 mg/dL  Protime-INR     Status: Abnormal   Collection Time: 06/01/16  6:17 AM  Result Value Ref Range   Prothrombin Time 26.0 (H) 11.4 - 15.2 seconds   INR 2.33   Glucose, capillary     Status: Abnormal   Collection Time: 06/01/16  6:46 AM  Result Value  Ref Range   Glucose-Capillary 139 (H) 65 - 99 mg/dL     Lipid Panel  No results found for: CHOL, TRIG, HDL, CHOLHDL, VLDL, LDLCALC, LDLDIRECT   Lab Results  Component Value Date   HGBA1C (H) 07/12/2009    6.2 (NOTE)                                                                       According to the ADA Clinical Practice Recommendations for 2011, when HbA1c is used as a screening test:   >=6.5%   Diagnostic of Diabetes Mellitus           (if abnormal result  is confirmed)  5.7-6.4%   Increased risk of developing Diabetes Mellitus  References:Diagnosis and Classification of Diabetes Mellitus,Diabetes XKGY,1856,31(SHFWY 1):S62-S69 and Standards of Medical Care in         Diabetes - 2011,Diabetes Care,2011,34  (Suppl 1):S11-S61.     Lab Results  Component Value Date   CREATININE 0.74 05/31/2016     HPI   [name] OVZCHY a 76 y.o.malewith past medical history of diabetes, hyperlipidemia, hypertension, kidney stones, stroke since to the emergency room after falling off a ladder. He was found to have a mild L1 fracture. A CT abd and pelvis shows liver cirrhosis and a 2.4 hypoechoic mass. MRI with and without contrast ordered for  further evaluation.   HOSPITAL COURSE:  Compression fracture of the L1 vertebra:  -continue Brace, pain control with the brace -PT consult-SNF , patient refuses Provided with oxycodone and Robaxin    Hypertension: well controlled.   Diabetes Mellitus:  -stable,  Continue SSI  Dyslipidemia-continue Zocor   Mild thrombocytopenia: possibly from liver cirrhosis.  -stable  Liver cirrhosis of unclear etiology./Liver mass  atypical hemangioma? Pt was not aware of the liver cirrhosis until this admission.  A 2.4 hypoechoic mass found in the liver,  MRI of the abdomen suggests this is a hemangioma, needs FUIn the outpatient setting to be arranged for by PCP   Probable pancreatic cyst  1.3 x 1.0 cm cystic lesion in the tail of the pancreas. This is nonspecific, but strongly favored to be benign.  -needs FU MRI down the road  H/o Afib/ stroke: on coumadin. INR therapeutic, rate controlled,    Discharge Exam:  Blood pressure (!) 152/75, pulse 79, temperature 98.2 F (36.8 C), temperature source Oral, resp. rate 18, height _0  (1.626 m), weight 79.4 kg (175 lb), SpO2 96 %.    General exam: Alert, awake, oriented x2 Respiratory system: decreased BS , no rales or wheezes Cardiovascular system: S1 & S2 heard, RRR. No JVD, murmurs,  Gastrointestinal system: Abdomen is slightly distended. , soft and nontender. No organomegaly or masses felt. Normal bowel sounds heard. Braces noted Central nervous system: Alert and oriented. No focal neurological deficits. Extremities: Symmetric 5 x 5 power. Skin: No rashes, lesions or ulcers     Signed: Reyne Dumas 06/01/2016, 11:57 AM        Time spent >1 hour

## 2016-06-01 NOTE — Care Management (Signed)
Case manager had spoken with patient concerning discharge plan. He was setup with HH. After PT evaluated patient it appears he will need SNF. He has 15 steps to climb and he is alone. Social worker has spoken with patient concerning SNF.

## 2016-06-01 NOTE — NC FL2 (Signed)
[place] MEDICAID FL2 LEVEL OF CARE SCREENING TOOL     IDENTIFICATION  Patient Name: [name] Birthdate: 05/04/1940 Sex: male Admission Date (Current Location): 05/28/2016  [facility] and [place] Number:  Producer, television/film/video and Address:  [facility], [address], [place], [place] [address]      Provider Number: [id number]  Attending Physician Name and Address:  Richarda Overlie, MD  Relative Name and Phone Number:       Current Level of Care: Hospital Recommended Level of Care: Skilled Nursing Facility Prior Approval Number:    Date Approved/Denied: 06/01/16 PASRR Number: [id number]  Discharge Plan: SNF    Current Diagnoses: Patient Active Problem List   Diagnosis Date Noted  . Closed compression fracture of L1 lumbar vertebra (HCC) 05/28/2016  . HTN (hypertension) 05/28/2016  . DM II (diabetes mellitus, type II), controlled (HCC) 05/28/2016  . Closed compression fracture of body of lumbar vertebra (HCC) 05/28/2016  . Incisional hernia x 2.  Repaired 9/60/2013. 08/21/2011    Orientation RESPIRATION BLADDER Height & Weight     Self, Time, Situation, Place  Normal Continent Weight: 175 lb (79.4 kg) Height:  5\' 4"  (162.6 cm)  BEHAVIORAL SYMPTOMS/MOOD NEUROLOGICAL BOWEL NUTRITION STATUS      Continent Diet (See DC Summary)  AMBULATORY STATUS COMMUNICATION OF NEEDS Skin   Limited Assist Verbally Normal                       Personal Care Assistance Level of Assistance  Bathing, Dressing Bathing Assistance: Limited assistance Feeding assistance: Limited assistance Dressing Assistance: Limited assistance     Functional Limitations Info  Sight, Hearing, Speech Sight Info: Impaired Hearing Info: Adequate Speech Info: Adequate    SPECIAL CARE FACTORS FREQUENCY  PT (By licensed PT), OT (By licensed OT)     PT Frequency: 5xweek OT Frequency: 5xweek            Contractures      Additional Factors Info  Code Status,  Allergies, Insulin Sliding Scale Code Status Info: DNR Allergies Info: PREDNISONE, ZITHROMAX AZITHROMYCIN    Insulin Sliding Scale Info: 15 units 3x's a day       Current Medications (06/01/2016):  This is the current hospital active medication list Current Facility-Administered Medications  Medication Dose Route Frequency Provider Last Rate Last Dose  . acetaminophen (TYLENOL) tablet 650 mg  650 mg Oral Q6H PRN Haydee Salter, MD   650 mg at 06/01/16 1027   Or  . acetaminophen (TYLENOL) suppository 650 mg  650 mg Rectal Q6H PRN Haydee Salter, MD      . hydrALAZINE (APRESOLINE) injection 10 mg  10 mg Intravenous Q8H PRN Haydee Salter, MD      . insulin aspart (novoLOG) injection 0-15 Units  0-15 Units Subcutaneous TID WC Haydee Salter, MD   2 Units at 06/01/16 [phone]  . losartan (COZAAR) tablet 100 mg  100 mg Oral q morning - 10a Haydee Salter, MD   100 mg at 06/01/16 6440  . ondansetron (ZOFRAN) tablet 4 mg  4 mg Oral Q6H PRN Haydee Salter, MD       Or  . ondansetron [facility]) injection 4 mg  4 mg Intravenous Q6H PRN Haydee Salter, MD      . oxyCODONE (Oxy IR/ROXICODONE) immediate release tablet 5 mg  5 mg Oral Q4H PRN Zannie Cove, MD   5 mg at 06/01/16 3474  . polyethylene  glycol (MIRALAX / GLYCOLAX) packet 17 g  17 g Oral Daily PRN Haydee SalterHobbs, Phillip M, MD   17 g at 06/01/16 [id number]  . simvastatin (ZOCOR) tablet 40 mg  40 mg Oral QPM Haydee SalterHobbs, Phillip M, MD   40 mg at 05/31/16 1718  . warfarin (COUMADIN) tablet 3 mg  3 mg Oral ONCE-1800 Armandina StammerBatchelder, Nathan J, RPH      . Warfarin - Pharmacist Dosing Inpatient   Does not apply q1800 Haydee SalterHobbs, Phillip M, MD         Discharge Medications: Please see discharge summary for a list of discharge medications.  Relevant Imaging Results:  Relevant Lab Results:   Additional Information [id number]  Tresa MoorePatricia V Dangelo Guzzetta, LCSW

## 2016-06-01 NOTE — Clinical Social Work Note (Signed)
Clinical Social Work Assessment  Patient Details  Name: [name] MRN: [id number] Date of Birth: 08-19-1940  Date of referral:  06/01/16               Reason for consult:  Facility Placement                Permission sought to share information with:  Facility Art therapist granted to share information::  Yes, Verbal Permission Granted  Name::        Agency::  SNF  Relationship::     Contact Information:     Housing/Transportation Living arrangements for the past 2 months:  Single Family Home Source of Information:  Patient Patient Interpreter Needed:  None Criminal Activity/Legal Involvement Pertinent to Current Situation/Hospitalization:  No - Comment as needed Significant Relationships:  None Lives with:  Self Do you feel safe going back to the place where you live?  No Need for family participation in patient care:  No (Coment)  Care giving concerns:  CSW met with patient this day. Patient resides alone and is not safe to DC home at this time.  Social Worker assessment / plan:  CSW discussed with patient recommendation from the clinical team to Dc to SNF. CSW discussed her role and SNF placement/options.  Patient indicated that he would like to go to [facility] as it is near his home. CSW obtained permission to send   Employment status:  Retired Forensic scientist:  Medicare PT Recommendations:  [place] / Referral to community resources:  [place]  Patient/Family's Response to care:  Patient appreciative of assistance from [place]. No issues at this time.  Patient/Family's Understanding of and Emotional Response to Diagnosis, Current Treatment, and Prognosis:  Patient has good understanding of diagnosis, current treatment and prognosis. Patient is hopeful that rehabilitation will assist with impairment. No issues or concerns at this time.  Emotional Assessment Appearance:  Appears stated  age Attitude/Demeanor/Rapport:   (Cooperative) Affect (typically observed):  Accepting, Appropriate Orientation:  Oriented to Self, Oriented to Place, Oriented to  Time, Oriented to Situation Alcohol / Substance use:  Not Applicable Psych involvement (Current and /or in the community):  No (Comment)  Discharge Needs  Concerns to be addressed:  Care Coordination Readmission within the last 30 days:  No Current discharge risk:  Physical Impairment, Dependent with Mobility Barriers to Discharge:  No Barriers Identified   Dustin Baxter, LCSW 06/01/2016, 2:39 PM

## 2016-06-01 NOTE — Progress Notes (Signed)
ANTICOAGULATION CONSULT NOTE - Follow Up Consult  Pharmacy Consult:  Coumadin Indication: atrial fibrillation  Allergies  Allergen Reactions  . Prednisone     Other reaction(s): Other (See Comments) unknown  . Zithromax [Azithromycin] Other (See Comments)    Weak and flulike symptoms    Patient Measurements: Height: 5\' 4"  (162.6 cm) Weight: 175 lb (79.4 kg) IBW/kg (Calculated) : 59.2  Assessment: 75 YOM admitted s/p fall from ladder on Coumadin 3mg  daily PTA for hx AFib/CVA, post IVC filter 2002. INR on admit low at 1.72. INR trending and remains therapeutic at 2.33. Hgb 13, plts low but stable at 128.   Goal of Therapy:  INR 2-3   Plan:  Give Coumadin 3mg  PO daily Monitor daily INR, CBC, s/s of bleed  May be discharged home today, would recommend continuing 3mg  daily    [name], PharmD, BCPS Clinical Pharmacist Pager [phone] 06/01/2016 8:40 AM

## 2016-06-01 NOTE — Progress Notes (Signed)
Pt discharge education and instructions completed. Pt discharge to SNF as pt agreed to go to [facility]. [facility] at the facility. Pt IV removed; TLSO on. [facility] picked up to be transported off to disposition. Pt transported off unit via stretcher with belongings to the side. Dionne BucyP. Amo Myeasha Ballowe RN

## 2016-06-01 NOTE — Social Work (Signed)
Clinical Social Worker facilitated patient discharge including contacting patient family and facility to confirm patient discharge plans.  Clinical information faxed to facility and family agreeable with plan.  CSW arranged [facility].  RN to call [phone], [facility], to report prior to discharge.  Clinical Social Worker will sign off for now as social work intervention is no longer needed. Please consult us again if new need arises.  Keene BreathPatricia Ross Bender, LCSW Clinical Social Worker [phone]

## 2016-06-02 ENCOUNTER — Encounter: Payer: Self-pay | Admitting: Internal Medicine

## 2016-06-02 ENCOUNTER — Non-Acute Institutional Stay (SKILLED_NURSING_FACILITY): Payer: Medicare Other | Admitting: Internal Medicine

## 2016-06-02 DIAGNOSIS — Z95828 Presence of other vascular implants and grafts: Secondary | ICD-10-CM

## 2016-06-02 DIAGNOSIS — S32000A Wedge compression fracture of unspecified lumbar vertebra, initial encounter for closed fracture: Secondary | ICD-10-CM

## 2016-06-02 DIAGNOSIS — K862 Cyst of pancreas: Secondary | ICD-10-CM | POA: Diagnosis not present

## 2016-06-02 DIAGNOSIS — I480 Paroxysmal atrial fibrillation: Secondary | ICD-10-CM

## 2016-06-02 DIAGNOSIS — E785 Hyperlipidemia, unspecified: Secondary | ICD-10-CM | POA: Diagnosis not present

## 2016-06-02 DIAGNOSIS — I10 Essential (primary) hypertension: Secondary | ICD-10-CM

## 2016-06-02 DIAGNOSIS — K7469 Other cirrhosis of liver: Secondary | ICD-10-CM | POA: Diagnosis not present

## 2016-06-02 DIAGNOSIS — D696 Thrombocytopenia, unspecified: Secondary | ICD-10-CM

## 2016-06-02 DIAGNOSIS — Z8673 Personal history of transient ischemic attack (TIA), and cerebral infarction without residual deficits: Secondary | ICD-10-CM

## 2016-06-02 DIAGNOSIS — M4856XA Collapsed vertebra, not elsewhere classified, lumbar region, initial encounter for fracture: Secondary | ICD-10-CM | POA: Diagnosis not present

## 2016-06-02 DIAGNOSIS — E1121 Type 2 diabetes mellitus with diabetic nephropathy: Secondary | ICD-10-CM

## 2016-06-02 NOTE — Progress Notes (Signed)
: Provider:  Randon Goldsmith. Lyn Hollingshead, MD Location:  [facility] Room Number: [phone] Place of Service:  SNF [phone])                                                      Tenny Craw, Darlen Round, MD Patient Care Team: [name], MD as PCP - General [facility])  Extended Emergency Contact Information Primary Emergency Contact: Fonner,Judy Address: [address]  [place], [place] Home Phone: [phone] Mobile Phone: [phone] Relation: Friend     Allergies: Prednisone and Zithromax [azithromycin]  Chief Complaint  Patient presents with  . New Admit To SNF    Admit to Facility    HPI: Patient is 76 y.o. male with diabetes, hyperlipidemia, hypertension, kidney stones, stroke since to the emergency room after falling off a ladder. Patient states he was well on the days leading up to admission. States felt well this morning. Was lifting a oversized mirror while standing atop a two-step ladder. Patient lost his balance but managed to throw the mirroe to the side mid air. Landed flat on his back. EMS activated. Came to ED.CT scan revealed vertebral body compression fracture. Patient was given at dose of morphine and Dilaudid and still was unable to ambulate secondary to pain. Pt was admitted to [facility] from 5/24-28 for pain control and PT.  CT abd and pelvis shows liver cirrhosis and a 2.4 hypoechoic mass.Pt was anaware of his cirrhosis. Pt is admitted to SNF for OT/PT. While at SNF pt will be followed for h/o AF and stroke, on coumadin, HTN, tx with cozaar and DM, tx with anaryl, glucophage and actos.  Past Medical History:  Diagnosis Date  . Closed compression fracture of L1 lumbar vertebra (HCC) 05/2016  . Diabetes mellitus   . Hyperlipidemia   . Hypertension   . Kidney stones    hx of  . Presence of inferior vena cava filter 2002  . Stroke [facility]) 2002   right eye no peripheral vision, short term memory loss     Past Surgical History:  Procedure Laterality Date  . APPENDECTOMY  1949  . BACK SURGERY    . CARPAL TUNNEL RELEASE  2002 both wrists  . COLON SURGERY  07/19/2009   sigmoid colectomy   . HERNIA REPAIR   1983, 1983, 02/03/2010   lap ventral hernia repair  . KIDNEY STONE SURGERY  1977, 1983  . LITHOTRIPSY  2010   bleeding in kidney after  . surgery for diverticulosis  2011  . TONSILLECTOMY  1947  . TONSILLECTOMY AND ADENOIDECTOMY    . VENTRAL HERNIA REPAIR  09/11/2011   Procedure: LAPAROSCOPIC VENTRAL HERNIA;  Surgeon: Kandis Cocking, MD;  Location: [facility] ORS;  Service: General;  Laterality: N/A;  Lapaaroscopic Repair Ventral Incisional Hernias x 2    Allergies as of 06/02/2016      Reactions   Prednisone    Other reaction(s): Other (See Comments) unknown   Zithromax [azithromycin] Other (See Comments)   Weak and flulike symptoms      Medication List       Accurate as of 06/02/16  9:46 AM. Always use your most recent med list.          glimepiride 4 MG  tablet Commonly known as:  AMARYL Take 4 mg by mouth every morning.   losartan 100 MG tablet Commonly known as:  COZAAR Take 100 mg by mouth every morning.   metFORMIN 500 MG tablet Commonly known as:  GLUCOPHAGE Take 1,000 mg by mouth 2 (two) times daily.   methocarbamol 500 MG tablet Commonly known as:  ROBAXIN Take 1 tablet (500 mg total) by mouth every 6 (six) hours as needed for muscle spasms.   oxyCODONE 5 MG immediate release tablet Commonly known as:  Oxy IR/ROXICODONE Take 1 tablet (5 mg total) by mouth every 4 (four) hours as needed for severe pain.   pioglitazone 30 MG tablet Commonly known as:  ACTOS Take 30 mg by mouth daily.   polyethylene glycol packet Commonly known as:  MIRALAX / GLYCOLAX Take 17 g by mouth daily as needed for mild constipation.   simvastatin 40 MG tablet Commonly known as:  ZOCOR Take 40 mg by mouth every evening.   warfarin 2 MG tablet Commonly known as:   COUMADIN Take 3 mg by mouth daily at 6 PM.       No orders of the defined types were placed in this encounter.   Immunization History  Administered Date(s) Administered  . Influenza-Unspecified 10/07/2015    Social History  Substance Use Topics  . Smoking status: Never Smoker  . Smokeless tobacco: Never Used  . Alcohol use No    Family history is Dad - COPD and HD  Family History  Problem Relation Age of Onset  . Cancer Neg Hx       Review of Systems  DATA OBTAINED: from patient, nurse GENERAL:  no fevers, fatigue, appetite changes SKIN: No itching, or rash EYES: No eye pain, redness, discharge EARS: No earache, tinnitus, change in hearing NOSE: No congestion, drainage or bleeding  MOUTH/THROAT: No mouth or tooth pain, No sore throat RESPIRATORY: No cough, wheezing, SOB CARDIAC: No chest pain, palpitations, lower extremity edema  GI: No abdominal pain, No N/V/D or constipation, No heartburn or reflux  GU: No dysuria, frequency or urgency, or incontinence  MUSCULOSKELETAL:  bone/joint pain is controlled NEUROLOGIC: No headache, dizziness or focal weakness PSYCHIATRIC: No c/o anxiety or sadness   Vitals:   06/02/16 0910  BP: 114/72  Pulse: 78  Resp: 20  Temp: 98.9 F (37.2 C)    SpO2 Readings from Last 1 Encounters:  06/01/16 96%   Body mass index is 30.04 kg/m.     Physical Exam  GENERAL APPEARANCE: Alert, conversant,  No acute distress.  SKIN: No diaphoresis rash HEAD: Normocephalic, atraumatic  EYES: Conjunctiva/lids clear. Pupils round, reactive. EOMs intact.  EARS: External exam WNL, canals clear. Hearing grossly normal.  NOSE: No deformity or discharge.  MOUTH/THROAT: Lips w/o lesions  RESPIRATORY: Breathing is even, unlabored. Lung sounds are clear   CARDIOVASCULAR: Heart RRR no murmurs, rubs or gallops. No peripheral edema.   GASTROINTESTINAL: Abdomen is soft, non-tender, not distended w/ normal bowel sounds. GENITOURINARY: Bladder non  tender, not distended  MUSCULOSKELETAL: No abnormal joints or musculature NEUROLOGIC:  Cranial nerves 2-12 grossly intact. Moves all extremities  PSYCHIATRIC: Mood and affect appropriate to situation, no behavioral issues  Patient Active Problem List   Diagnosis Date Noted  . Closed compression fracture of L1 lumbar vertebra (HCC) 05/28/2016  . HTN (hypertension) 05/28/2016  . DM II (diabetes mellitus, type II), controlled (HCC) 05/28/2016  . Closed compression fracture of body of lumbar vertebra (HCC) 05/28/2016  . Incisional hernia x  2.  Repaired 9/60/2013. 08/21/2011      Labs reviewed: Basic Metabolic Panel:    Component Value Date/Time   NA 136 05/31/2016 0352   NA 136 (A) 05/31/2016   K 3.6 05/31/2016 0352   CL 104 05/31/2016 0352   CO2 23 05/31/2016 0352   GLUCOSE 150 (H) 05/31/2016 0352   BUN 13 05/31/2016 0352   BUN 13 05/31/2016   CREATININE 0.74 05/31/2016 0352   CALCIUM 8.8 (L) 05/31/2016 0352   PROT 6.1 09/12/2011 0450   ALBUMIN 3.3 (L) 09/12/2011 0450   AST 32 09/12/2011 0450   ALT 35 09/12/2011 0450   ALKPHOS 35 (L) 09/12/2011 0450   BILITOT 1.0 09/12/2011 0450   GFRNONAA >60 05/31/2016 0352   GFRAA >60 05/31/2016 0352     Recent Labs  05/28/16 1059 05/29/16 05/29/16 0638 05/31/16 05/31/16 0352  NA 137 136* 136 136* 136  K 4.2 3.8 3.8  --  3.6  CL 105  --  105  --  104  CO2  --   --  22  --  23  GLUCOSE 203*  --  130*  --  150*  BUN 15 14 14 13 13   CREATININE 0.80 0.9 0.92 0.7 0.74  CALCIUM  --   --  8.7*  --  8.8*   Liver Function Tests: No results for input(s): AST, ALT, ALKPHOS, BILITOT, PROT, ALBUMIN in the last 8760 hours. No results for input(s): LIPASE, AMYLASE in the last 8760 hours. No results for input(s): AMMONIA in the last 8760 hours. CBC:  Recent Labs  05/29/16 05/29/16 0638 05/31/16 05/31/16 0352  WBC 7.2 7.2 7.7 7.7  HGB 12.5* 12.5*  --  13.0  HCT 37* 37.1*  --  39.1  MCV  --  93.2  --  92.7  PLT 119* 119*  --  128*    Lipid No results for input(s): CHOL, HDL, LDLCALC, TRIG in the last 8760 hours.  Cardiac Enzymes: No results for input(s): CKTOTAL, CKMB, CKMBINDEX, TROPONINI in the last 8760 hours. BNP: No results for input(s): BNP in the last 8760 hours. No results found for: [facility] Lab Results  Component Value Date   HGBA1C (H) 07/12/2009    6.2 (NOTE)                                                                       According to the ADA Clinical Practice Recommendations for 2011, when HbA1c is used as a screening test:   >=6.5%   Diagnostic of Diabetes Mellitus           (if abnormal result  is confirmed)  5.7-6.4%   Increased risk of developing Diabetes Mellitus  References:Diagnosis and Classification of Diabetes Mellitus,Diabetes Care,2011,34(Suppl 1):S62-S69 and Standards of Medical Care in         Diabetes - 2011,Diabetes Care,2011,34  (Suppl 1):S11-S61.   No results found for: TSH No results found for: VITAMINB12 No results found for: FOLATE No results found for: IRON, TIBC, FERRITIN  Imaging and Procedures obtained prior to SNF admission: Dg Chest 2 View  Result Date: 05/28/2016 CLINICAL DATA:  76 year old male with a history of fall from ladder EXAM: CHEST  2 VIEW COMPARISON:  08/27/2011, 01/31/2010 FINDINGS: Cardiomediastinal silhouette unchanged  in size and contour. Calcifications of the aortic arch. No pneumothorax. No pleural effusion. Similar appearance of coarsened interstitial markings when compared to prior plain films. No confluent airspace disease. Lateral view demonstrates new compression fracture of L1 which was not present on a remote CT dated 06/13/2009. Multilevel degenerative changes of the visualized spine. IMPRESSION: Low lung volumes and chronic lung changes without evidence of superimposed acute cardiopulmonary disease. Lateral view of the chest incidentally image is a new compression fracture of L1. Comparison study is dated 06/13/2009. Chronicity is  indeterminate, and if there is concern for acute pain at this level and possibility of acute fracture, plain film series may be useful. Aortic atherosclerosis. Electronically Signed   By: Gilmer Mor D.O.   On: 05/28/2016 11:40   Dg Ankle Complete Right  Result Date: 05/28/2016 CLINICAL DATA:  Fall from ladder with right ankle pain, initial encounter EXAM: RIGHT ANKLE - COMPLETE 3+ VIEW COMPARISON:  None. FINDINGS: There is no evidence of fracture, dislocation, or joint effusion. There is no evidence of arthropathy or other focal bone abnormality. Soft tissues are unremarkable. IMPRESSION: No acute abnormality noted. Electronically Signed   By: Alcide Clever M.D.   On: 05/28/2016 11:38   Ct Head Wo Contrast  Result Date: 05/29/2016 CLINICAL DATA:  Status post fall backwards off 2-step ladder, with concern for head or cervical spine injury. Initial encounter. EXAM: CT HEAD WITHOUT CONTRAST CT CERVICAL SPINE WITHOUT CONTRAST TECHNIQUE: Multidetector CT imaging of the head and cervical spine was performed following the standard protocol without intravenous contrast. Multiplanar CT image reconstructions of the cervical spine were also generated. COMPARISON:  None. FINDINGS: CT HEAD FINDINGS Brain: No evidence of acute infarction, hemorrhage, hydrocephalus, extra-axial collection or mass lesion/mass effect. Prominence of the ventricles and sulci reflects mild to moderate cortical volume loss. Chronic encephalomalacia is noted at the left occipital lobe, reflecting remote infarct. Mild cerebellar atrophy is noted. Small chronic infarcts are noted at the left cerebellar hemisphere. A small chronic lacunar infarct is noted at the right basal ganglia. Mild periventricular white matter change likely reflects small vessel ischemic microangiopathy. The brainstem and fourth ventricle are within normal limits. No mass effect or midline shift is seen. Vascular: No hyperdense vessel or unexpected calcification. Skull:  There is no evidence of fracture; visualized osseous structures are unremarkable in appearance. Sinuses/Orbits: The orbits are within normal limits. There is mild partial opacification of the left maxillary sinus. The remaining paranasal sinuses and mastoid air cells are well-aerated. Other: No significant soft tissue abnormalities are seen. CT CERVICAL SPINE FINDINGS Alignment: Normal. Skull base and vertebrae: No acute fracture. No primary bone lesion or focal pathologic process. Soft tissues and spinal canal: No prevertebral fluid or swelling. No visible canal hematoma. Disc levels: There is mild disc space narrowing at C3-C4, with scattered anterior and posterior disc osteophyte complexes along the cervical spine. Upper chest: The visualized lung apices are clear. The thyroid gland is unremarkable in appearance. Other: No additional soft tissue abnormalities are seen. IMPRESSION: 1. No evidence of traumatic intracranial injury or fracture. 2. No evidence of fracture or subluxation along the cervical spine. 3. Mild to moderate cortical volume loss and scattered small vessel ischemic microangiopathy. 4. Chronic encephalomalacia at the left occipital lobe, reflecting remote infarct. Small chronic infarcts at the left cerebellar hemisphere, and small chronic lacunar infarct at the right basal ganglia. 5. Mild partial opacification of the left maxillary sinus. 6. Mild degenerative change along the cervical spine. Electronically Signed  By: Roanna Raider M.D.   On: 05/29/2016 02:05   Ct Cervical Spine Wo Contrast  Result Date: 05/29/2016 CLINICAL DATA:  Status post fall backwards off 2-step ladder, with concern for head or cervical spine injury. Initial encounter. EXAM: CT HEAD WITHOUT CONTRAST CT CERVICAL SPINE WITHOUT CONTRAST TECHNIQUE: Multidetector CT imaging of the head and cervical spine was performed following the standard protocol without intravenous contrast. Multiplanar CT image reconstructions of the  cervical spine were also generated. COMPARISON:  None. FINDINGS: CT HEAD FINDINGS Brain: No evidence of acute infarction, hemorrhage, hydrocephalus, extra-axial collection or mass lesion/mass effect. Prominence of the ventricles and sulci reflects mild to moderate cortical volume loss. Chronic encephalomalacia is noted at the left occipital lobe, reflecting remote infarct. Mild cerebellar atrophy is noted. Small chronic infarcts are noted at the left cerebellar hemisphere. A small chronic lacunar infarct is noted at the right basal ganglia. Mild periventricular white matter change likely reflects small vessel ischemic microangiopathy. The brainstem and fourth ventricle are within normal limits. No mass effect or midline shift is seen. Vascular: No hyperdense vessel or unexpected calcification. Skull: There is no evidence of fracture; visualized osseous structures are unremarkable in appearance. Sinuses/Orbits: The orbits are within normal limits. There is mild partial opacification of the left maxillary sinus. The remaining paranasal sinuses and mastoid air cells are well-aerated. Other: No significant soft tissue abnormalities are seen. CT CERVICAL SPINE FINDINGS Alignment: Normal. Skull base and vertebrae: No acute fracture. No primary bone lesion or focal pathologic process. Soft tissues and spinal canal: No prevertebral fluid or swelling. No visible canal hematoma. Disc levels: There is mild disc space narrowing at C3-C4, with scattered anterior and posterior disc osteophyte complexes along the cervical spine. Upper chest: The visualized lung apices are clear. The thyroid gland is unremarkable in appearance. Other: No additional soft tissue abnormalities are seen. IMPRESSION: 1. No evidence of traumatic intracranial injury or fracture. 2. No evidence of fracture or subluxation along the cervical spine. 3. Mild to moderate cortical volume loss and scattered small vessel ischemic microangiopathy. 4. Chronic  encephalomalacia at the left occipital lobe, reflecting remote infarct. Small chronic infarcts at the left cerebellar hemisphere, and small chronic lacunar infarct at the right basal ganglia. 5. Mild partial opacification of the left maxillary sinus. 6. Mild degenerative change along the cervical spine. Electronically Signed   By: Roanna Raider M.D.   On: 05/29/2016 02:05   Ct Abdomen Pelvis W Contrast  Result Date: 05/28/2016 CLINICAL DATA:  Fall from ladder this morning. Low back pain. History of ventral hernia repair. EXAM: CT ABDOMEN AND PELVIS WITH CONTRAST TECHNIQUE: Multidetector CT imaging of the abdomen and pelvis was performed using the standard protocol following bolus administration of intravenous contrast. CONTRAST:  ISOVUE-300 IOPAMIDOL (ISOVUE-300) INJECTION 61% COMPARISON:  01/16/2016 abdominal radiograph. 07/12/2009 CT abdomen/pelvis. FINDINGS: Lower chest: No significant pulmonary nodules or acute consolidative airspace disease. Coronary atherosclerosis. Hepatobiliary: Liver surface is diffusely irregular with relative hypertrophy of the lateral segment left liver and caudate lobes, compatible cirrhosis. Peripheral segment 7 right liver lobe 2.8 x 2.4 cm hypoenhancing mass (series 3/ image 21). subcentimeter hypodense lateral segment left liver lobe lesion. No additional liver lesions. No liver laceration. Normal gallbladder with no radiopaque cholelithiasis. No biliary ductal dilatation. Pancreas: Heterogeneous fatty infiltration of the pancreas. Cystic 1.2 cm pancreatic tail lesion (series 3/image 28). No additional pancreatic lesions. No pancreatic duct dilation. Scattered calcifications throughout the pancreas are favored to represent vascular calcifications. Spleen: Normal size. No laceration  or mass. Adrenals/Urinary Tract: Normal adrenals. No hydronephrosis. No renal laceration. Indeterminate hypodense partially calcified 2.6 x 1.8 cm posterior interpolar left renal lesion (series  3/ image 42). Simple 3.4 cm anterior lower left renal cyst. Nonobstructing 9 mm interpolar and 9 mm lower right renal stones. Nonobstructing 11 mm lower left renal stone. Normal bladder. Stomach/Bowel: Grossly normal stomach. Normal caliber small bowel with no small bowel wall thickening. Appendectomy. Normal large bowel with no diverticulosis, large bowel wall thickening or pericolonic fat stranding. Vascular/Lymphatic: Atherosclerotic nonaneurysmal abdominal aorta. IVC filter is in place below the level of the renal veins. Patent portal, splenic and renal veins. Small gastroesophageal and paraumbilical varices. No pathologically enlarged lymph nodes in the abdomen or pelvis. Reproductive: Moderately enlarged prostate with nonspecific internal prostatic calcifications. Other: No pneumoperitoneum, ascites or focal fluid collection. No evidence of a recurrent ventral abdominal hernia status post mesh repair. Musculoskeletal: No aggressive appearing focal osseous lesions. Mild compression fracture of the superior L1 vertebral body with minimal 20% loss of vertebral body height, probably acute. No additional fracture. Moderate thoracolumbar spondylosis. IMPRESSION: 1. Mild L1 vertebral compression fracture, probably acute. No additional acute traumatic injury in the abdomen or pelvis. 2. Cirrhosis. Hypoenhancing 2.8 cm segment 7 right liver lobe mass, cannot exclude hepatocellular carcinoma. MRI abdomen without and with IV contrast recommended on a short term outpatient basis for further characterization. 3. Small gastroesophageal and paraumbilical varices. No ascites. Normal size spleen. 4. Cystic 1.2 cm pancreatic tail lesion, which can also be further characterized at MRI abdomen without and with IV contrast. 5. Indeterminate low-attenuation posterior interpolar left renal mass, which can also be further characterized at MRI abdomen without and with IV contrast. 6. Additional findings include aortic atherosclerosis,  coronary atherosclerosis, nonobstructing bilateral nephrolithiasis and moderate prostatomegaly. Electronically Signed   By: Delbert PhenixJason A Poff M.D.   On: 05/28/2016 12:25     Not all labs, radiology exams or other studies done during hospitalization come through on my EPIC note; however they are reviewed by me.    Assessment and Plan  CLOSED COMPRESSION FRACTURE L1 VERTEBRA -continue Brace, pain control with the brace SNF - admitted for OT/PT; cont oxycodone and robaxin  HTN SNF - controlled; cont cozaar100 mg daily   DM2 - BS controlled with SSI in hosp SNF - on admit to restartamaryl 4 mg daily, glucophage 1000 mg bid and actos 30 mg daily  LIVER CIRRHOSIS OF UNCLEAR ETIOLOGY; LIVER MASS- Pt was not aware of the liver cirrhosis until this admission.  A 2.4 hypoechoic mass found in the liver,  MRI of the abdomen suggests this is a hemangioma, needs FUIn the outpatient setting to be arranged for by PCP  PROBABLE PANCREATICI CYST - FAVORED TO BE BENIGN - needs f/u MRI in future  HYPERLIPIDEMIA SNF - cont zocor 40 mg daily  H/O A FIB/STROKE snf - CONT COUMADIN WICH WILL BE ACTIVELY TITRATED; rate is controlled  THROMBOCYTOPENIA - mild SNF - probably from cirrhosis; will f/u CBC   Time spent > 45 min;> 50% of time with patient was spent reviewing records, labs, tests and studies, counseling and developing plan of care  Thurston Holenne D. Lyn HollingsheadAlexander, MD

## 2016-06-04 LAB — BASIC METABOLIC PANEL
BUN: 20 mg/dL (ref 4–21)
CREATININE: 0.8 mg/dL (ref 0.6–1.3)
Glucose: 214 mg/dL
Potassium: 4.3 mmol/L (ref 3.4–5.3)
Sodium: 139 mmol/L (ref 137–147)

## 2016-06-04 LAB — CBC AND DIFFERENTIAL
HCT: 40 % — AB (ref 41–53)
HEMOGLOBIN: 14.3 g/dL (ref 13.5–17.5)
PLATELETS: 183 10*3/uL (ref 150–399)
WBC: 6.6 10*3/mL

## 2016-06-07 ENCOUNTER — Encounter: Payer: Self-pay | Admitting: Internal Medicine

## 2016-06-07 DIAGNOSIS — K862 Cyst of pancreas: Secondary | ICD-10-CM | POA: Insufficient documentation

## 2016-06-07 DIAGNOSIS — Z8673 Personal history of transient ischemic attack (TIA), and cerebral infarction without residual deficits: Secondary | ICD-10-CM | POA: Insufficient documentation

## 2016-06-07 DIAGNOSIS — D696 Thrombocytopenia, unspecified: Secondary | ICD-10-CM | POA: Insufficient documentation

## 2016-06-07 DIAGNOSIS — E785 Hyperlipidemia, unspecified: Secondary | ICD-10-CM | POA: Insufficient documentation

## 2016-06-07 DIAGNOSIS — K7460 Unspecified cirrhosis of liver: Secondary | ICD-10-CM | POA: Insufficient documentation

## 2016-06-07 DIAGNOSIS — I4891 Unspecified atrial fibrillation: Secondary | ICD-10-CM | POA: Insufficient documentation

## 2016-06-16 ENCOUNTER — Non-Acute Institutional Stay (SKILLED_NURSING_FACILITY): Payer: Medicare Other | Admitting: Internal Medicine

## 2016-06-16 ENCOUNTER — Encounter: Payer: Self-pay | Admitting: Internal Medicine

## 2016-06-16 DIAGNOSIS — E1121 Type 2 diabetes mellitus with diabetic nephropathy: Secondary | ICD-10-CM | POA: Diagnosis not present

## 2016-06-16 DIAGNOSIS — D696 Thrombocytopenia, unspecified: Secondary | ICD-10-CM

## 2016-06-16 DIAGNOSIS — S32010D Wedge compression fracture of first lumbar vertebra, subsequent encounter for fracture with routine healing: Secondary | ICD-10-CM | POA: Diagnosis not present

## 2016-06-16 DIAGNOSIS — I10 Essential (primary) hypertension: Secondary | ICD-10-CM

## 2016-06-16 DIAGNOSIS — S32000A Wedge compression fracture of unspecified lumbar vertebra, initial encounter for closed fracture: Secondary | ICD-10-CM

## 2016-06-16 DIAGNOSIS — M4856XA Collapsed vertebra, not elsewhere classified, lumbar region, initial encounter for fracture: Secondary | ICD-10-CM | POA: Diagnosis not present

## 2016-06-16 DIAGNOSIS — K862 Cyst of pancreas: Secondary | ICD-10-CM

## 2016-06-16 DIAGNOSIS — E785 Hyperlipidemia, unspecified: Secondary | ICD-10-CM | POA: Diagnosis not present

## 2016-06-16 DIAGNOSIS — K7469 Other cirrhosis of liver: Secondary | ICD-10-CM

## 2016-06-16 DIAGNOSIS — Z95828 Presence of other vascular implants and grafts: Secondary | ICD-10-CM

## 2016-06-16 DIAGNOSIS — I480 Paroxysmal atrial fibrillation: Secondary | ICD-10-CM

## 2016-06-16 NOTE — Progress Notes (Signed)
Location:  [facility] Room Number: 104P Place of Service:  SNF (31) [name]. Lyn Hollingshead, MD  PCP: [name], MD Patient Care Team: [name], MD as PCP - [facility])  Extended Emergency Contact Information Primary Emergency Contact: Kizzie Fantasia Address: [address]  [place], [place] Home Phone: [phone] Mobile Phone: [phone] Relation: Friend  Allergies  Allergen Reactions  . Prednisone     Other reaction(s): Other (See Comments) unknown  . Zithromax [Azithromycin] Other (See Comments)    Weak and flulike symptoms    Chief Complaint  Patient presents with  . Discharge Note    Discharged from SNF    HPI:  76 y.o. male with diabetes, hyperlipidemia, hypertension, kidney stones, stroke since to the emergency room after falling off a ladder. Patient states he was well on the days leading up to admission. States felt well this morning. Was lifting a oversized mirror while standing atop a two-step ladder. Patient lost his balance but managed to throw the mirroeto the side mid air. Landed flat on his back. EMS activated. Came to ED.CT scan revealed vertebral body compression fracture. Patient was given at dose of morphine and Dilaudid and still was unable to ambulate secondary to pain. Pt was admitted to [facility] from 5/24-28 for pain control and PT. CT abd and pelvis shows liver cirrhosis and a 2.4 hypoechoic mass.Pt was anaware of his cirrhosis. Pt was admitted to SNF for OT/PT and is now ready to be d/c to home.    Past Medical History:  Diagnosis Date  . Closed compression fracture of L1 lumbar vertebra (HCC) 05/2016  . Diabetes mellitus   . Hyperlipidemia   . Hypertension   . Kidney stones    hx of  . Presence of inferior vena cava filter 2002  . Stroke [facility]) 2002   right eye no peripheral vision, short term memory loss    Past Surgical History:  Procedure  Laterality Date  . APPENDECTOMY  1949  . BACK SURGERY    . CARPAL TUNNEL RELEASE  2002 both wrists  . COLON SURGERY  07/19/2009   sigmoid colectomy   . HERNIA REPAIR   1983, 1983, 02/03/2010   lap ventral hernia repair  . KIDNEY STONE SURGERY  1977, 1983  . LITHOTRIPSY  2010   bleeding in kidney after  . surgery for diverticulosis  2011  . TONSILLECTOMY  1947  . TONSILLECTOMY AND ADENOIDECTOMY    . VENTRAL HERNIA REPAIR  09/11/2011   Procedure: LAPAROSCOPIC VENTRAL HERNIA;  Surgeon: Kandis Cocking, MD;  Location: [facility] ORS;  Service: General;  Laterality: N/A;  Lapaaroscopic Repair Ventral Incisional Hernias x 2     reports that he has never smoked. He has never used smokeless tobacco. He reports that he does not drink alcohol or use drugs. Social History   Social History  . Marital status: Divorced    Spouse name: N/A  . Number of children: N/A  . Years of education: N/A   Occupational History  . Not on file.   Social History Main Topics  . Smoking status: Never Smoker  . Smokeless tobacco: Never Used  . Alcohol use No  . Drug use: No  . Sexual activity: Not on file   Other Topics Concern  . Not on file   Social History Narrative  . No narrative on file    Pertinent  Health Maintenance Due  Topic Date Due  . FOOT EXAM  08/01/1950  . OPHTHALMOLOGY EXAM  08/01/1950  . COLONOSCOPY  08/01/1991  . PNA vac Low Risk Adult (1 of 2 - PCV13) 08/01/2006  . HEMOGLOBIN A1C  01/12/2010  . INFLUENZA VACCINE  08/05/2016    Medications: Allergies as of 06/16/2016      Reactions   Prednisone    Other reaction(s): Other (See Comments) unknown   Zithromax [azithromycin] Other (See Comments)   Weak and flulike symptoms      Medication List       Accurate as of 06/16/16 12:19 PM. Always use your most recent med list.          glimepiride 4 MG tablet Commonly known as:  AMARYL Take 4 mg by mouth every morning.   losartan 100 MG tablet Commonly known as:  COZAAR Take  100 mg by mouth every morning.   metFORMIN 500 MG tablet Commonly known as:  GLUCOPHAGE Take 1,000 mg by mouth 2 (two) times daily.   methocarbamol 500 MG tablet Commonly known as:  ROBAXIN Take 1 tablet (500 mg total) by mouth every 6 (six) hours as needed for muscle spasms.   oxyCODONE 5 MG immediate release tablet Commonly known as:  Oxy IR/ROXICODONE Take 1 tablet (5 mg total) by mouth every 4 (four) hours as needed for severe pain.   pioglitazone 30 MG tablet Commonly known as:  ACTOS Take 30 mg by mouth daily.   polyethylene glycol packet Commonly known as:  MIRALAX / GLYCOLAX Take 17 g by mouth daily as needed for mild constipation.   simvastatin 40 MG tablet Commonly known as:  ZOCOR Take 40 mg by mouth every evening.   warfarin 3 MG tablet Commonly known as:  COUMADIN Take 3 mg by mouth daily.        Vitals:   06/16/16 0905  BP: 133/81  Pulse: 85  Resp: 18  Temp: 97.7 F (36.5 C)  Weight: 175 lb 6.4 oz (79.6 kg)  Height: 5\' 4"  (1.626 m)   Body mass index is 30.11 kg/m.  Physical Exam  GENERAL APPEARANCE: Alert, conversant. No acute distress.  HEENT: Unremarkable. RESPIRATORY: Breathing is even, unlabored. Lung sounds are clear   CARDIOVASCULAR: Heart RRR no murmurs, rubs or gallops. No peripheral edema.  GASTROINTESTINAL: Abdomen is soft, non-tender, not distended w/ normal bowel sounds.  NEUROLOGIC: Cranial nerves 2-12 grossly intact. Moves all extremities   Labs reviewed: Basic Metabolic Panel:  Recent Labs  16/10/9603/24/18 1059  05/29/16 0638 05/31/16 05/31/16 0352 06/04/16  NA 137  < > 136 136* 136 139  K 4.2  < > 3.8  --  3.6 4.3  CL 105  --  105  --  104  --   CO2  --   --  22  --  23  --   GLUCOSE 203*  --  130*  --  150*  --   BUN 15  < > 14 13 13 20   CREATININE 0.80  < > 0.92 0.7 0.74 0.8  CALCIUM  --   --  8.7*  --  8.8*  --   < > = values in this interval not displayed. No results found for: [facility] Liver Function Tests: No  results for input(s): AST, ALT, ALKPHOS, BILITOT, PROT, ALBUMIN in the last 8760 hours. No results for input(s): LIPASE, AMYLASE in the last 8760 hours. No results for input(s): AMMONIA in the last 8760 hours. CBC:  Recent Labs  05/29/16 1610 05/31/16 05/31/16 0352 06/04/16  WBC 7.2 7.7 7.7 6.6  HGB 12.5*  --  13.0 14.3  HCT 37.1*  --  39.1 40*  MCV 93.2  --  92.7  --   PLT 119*  --  128* 183   Lipid No results for input(s): CHOL, HDL, LDLCALC, TRIG in the last 8760 hours. Cardiac Enzymes: No results for input(s): CKTOTAL, CKMB, CKMBINDEX, TROPONINI in the last 8760 hours. BNP: No results for input(s): BNP in the last 8760 hours. CBG:  Recent Labs  06/01/16 0609 06/01/16 0646 06/01/16 1211  GLUCAP 134* 139* 153*    Procedures and Imaging Studies During Stay: Dg Chest 2 View  Result Date: 05/28/2016 CLINICAL DATA:  76 year old male with a history of fall from ladder EXAM: CHEST  2 VIEW COMPARISON:  08/27/2011, 01/31/2010 FINDINGS: Cardiomediastinal silhouette unchanged in size and contour. Calcifications of the aortic arch. No pneumothorax. No pleural effusion. Similar appearance of coarsened interstitial markings when compared to prior plain films. No confluent airspace disease. Lateral view demonstrates new compression fracture of L1 which was not present on a remote CT dated 06/13/2009. Multilevel degenerative changes of the visualized spine. IMPRESSION: Low lung volumes and chronic lung changes without evidence of superimposed acute cardiopulmonary disease. Lateral view of the chest incidentally image is a new compression fracture of L1. Comparison study is dated 06/13/2009. Chronicity is indeterminate, and if there is concern for acute pain at this level and possibility of acute fracture, plain film series may be useful. Aortic atherosclerosis. Electronically Signed   By: Gilmer Mor D.O.   On: 05/28/2016 11:40   Dg Ankle Complete Right  Result Date: 05/28/2016 CLINICAL  DATA:  Fall from ladder with right ankle pain, initial encounter EXAM: RIGHT ANKLE - COMPLETE 3+ VIEW COMPARISON:  None. FINDINGS: There is no evidence of fracture, dislocation, or joint effusion. There is no evidence of arthropathy or other focal bone abnormality. Soft tissues are unremarkable. IMPRESSION: No acute abnormality noted. Electronically Signed   By: Alcide Clever M.D.   On: 05/28/2016 11:38   Ct Head Wo Contrast  Result Date: 05/29/2016 CLINICAL DATA:  Status post fall backwards off 2-step ladder, with concern for head or cervical spine injury. Initial encounter. EXAM: CT HEAD WITHOUT CONTRAST CT CERVICAL SPINE WITHOUT CONTRAST TECHNIQUE: Multidetector CT imaging of the head and cervical spine was performed following the standard protocol without intravenous contrast. Multiplanar CT image reconstructions of the cervical spine were also generated. COMPARISON:  None. FINDINGS: CT HEAD FINDINGS Brain: No evidence of acute infarction, hemorrhage, hydrocephalus, extra-axial collection or mass lesion/mass effect. Prominence of the ventricles and sulci reflects mild to moderate cortical volume loss. Chronic encephalomalacia is noted at the left occipital lobe, reflecting remote infarct. Mild cerebellar atrophy is noted. Small chronic infarcts are noted at the left cerebellar hemisphere. A small chronic lacunar infarct is noted at the right basal ganglia. Mild periventricular white matter change likely reflects small vessel ischemic microangiopathy. The brainstem and fourth ventricle are within normal limits. No mass effect or midline shift is seen. Vascular: No hyperdense vessel or unexpected calcification. Skull: There is no evidence of fracture; visualized osseous structures are unremarkable in appearance. Sinuses/Orbits: The orbits are within normal limits. There is mild partial opacification of the left maxillary sinus. The remaining paranasal sinuses and mastoid air cells are well-aerated. Other: No  significant soft tissue abnormalities are seen. CT CERVICAL SPINE FINDINGS Alignment: Normal. Skull base and vertebrae: No acute fracture. No primary bone lesion or  focal pathologic process. Soft tissues and spinal canal: No prevertebral fluid or swelling. No visible canal hematoma. Disc levels: There is mild disc space narrowing at C3-C4, with scattered anterior and posterior disc osteophyte complexes along the cervical spine. Upper chest: The visualized lung apices are clear. The thyroid gland is unremarkable in appearance. Other: No additional soft tissue abnormalities are seen. IMPRESSION: 1. No evidence of traumatic intracranial injury or fracture. 2. No evidence of fracture or subluxation along the cervical spine. 3. Mild to moderate cortical volume loss and scattered small vessel ischemic microangiopathy. 4. Chronic encephalomalacia at the left occipital lobe, reflecting remote infarct. Small chronic infarcts at the left cerebellar hemisphere, and small chronic lacunar infarct at the right basal ganglia. 5. Mild partial opacification of the left maxillary sinus. 6. Mild degenerative change along the cervical spine. Electronically Signed   By: Roanna Raider M.D.   On: 05/29/2016 02:05   Ct Cervical Spine Wo Contrast  Result Date: 05/29/2016 CLINICAL DATA:  Status post fall backwards off 2-step ladder, with concern for head or cervical spine injury. Initial encounter. EXAM: CT HEAD WITHOUT CONTRAST CT CERVICAL SPINE WITHOUT CONTRAST TECHNIQUE: Multidetector CT imaging of the head and cervical spine was performed following the standard protocol without intravenous contrast. Multiplanar CT image reconstructions of the cervical spine were also generated. COMPARISON:  None. FINDINGS: CT HEAD FINDINGS Brain: No evidence of acute infarction, hemorrhage, hydrocephalus, extra-axial collection or mass lesion/mass effect. Prominence of the ventricles and sulci reflects mild to moderate cortical volume loss. Chronic  encephalomalacia is noted at the left occipital lobe, reflecting remote infarct. Mild cerebellar atrophy is noted. Small chronic infarcts are noted at the left cerebellar hemisphere. A small chronic lacunar infarct is noted at the right basal ganglia. Mild periventricular white matter change likely reflects small vessel ischemic microangiopathy. The brainstem and fourth ventricle are within normal limits. No mass effect or midline shift is seen. Vascular: No hyperdense vessel or unexpected calcification. Skull: There is no evidence of fracture; visualized osseous structures are unremarkable in appearance. Sinuses/Orbits: The orbits are within normal limits. There is mild partial opacification of the left maxillary sinus. The remaining paranasal sinuses and mastoid air cells are well-aerated. Other: No significant soft tissue abnormalities are seen. CT CERVICAL SPINE FINDINGS Alignment: Normal. Skull base and vertebrae: No acute fracture. No primary bone lesion or focal pathologic process. Soft tissues and spinal canal: No prevertebral fluid or swelling. No visible canal hematoma. Disc levels: There is mild disc space narrowing at C3-C4, with scattered anterior and posterior disc osteophyte complexes along the cervical spine. Upper chest: The visualized lung apices are clear. The thyroid gland is unremarkable in appearance. Other: No additional soft tissue abnormalities are seen. IMPRESSION: 1. No evidence of traumatic intracranial injury or fracture. 2. No evidence of fracture or subluxation along the cervical spine. 3. Mild to moderate cortical volume loss and scattered small vessel ischemic microangiopathy. 4. Chronic encephalomalacia at the left occipital lobe, reflecting remote infarct. Small chronic infarcts at the left cerebellar hemisphere, and small chronic lacunar infarct at the right basal ganglia. 5. Mild partial opacification of the left maxillary sinus. 6. Mild degenerative change along the cervical  spine. Electronically Signed   By: Roanna Raider M.D.   On: 05/29/2016 02:05   Mr Abdomen W Wo Contrast  Result Date: 05/30/2016 CLINICAL DATA:  76 year old male with history of diabetes, hyperlipidemia and hypertension with history of trauma from a recent fall off of a ladder. L1 vertebral compression fracture. Indeterminate  liver lesion noted on prior CT examination. EXAM: MRI ABDOMEN WITHOUT AND WITH CONTRAST TECHNIQUE: Multiplanar multisequence MR imaging of the abdomen was performed both before and after the administration of intravenous contrast. CONTRAST:  15mL MULTIHANCE GADOBENATE DIMEGLUMINE 529 MG/ML IV SOLN COMPARISON:  No prior abdominal MRI.  Abdominal CT scan 05/28/2016. FINDINGS: Lower chest: Unremarkable. Hepatobiliary: Liver has a shrunken appearance and nodular contour with hypertrophy of the caudate lobe, indicative of underlying cirrhosis. In the lateral aspect of segment 7 of the liver (image 13 of series 5) there is a 2.2 x 2.3 cm lesion that is T1 hypointense, slightly T2 hyperintense, does not restrict diffusion, is hypovascular, but demonstrates progressive delayed enhancement (without characteristic early peripheral nodular enhancement and centripetal filling as seen with most hemangiomas). 8 mm T1 hypointense, T2 hyperintense, nonenhancing lesion in the inferior aspect of segment 3 of the liver, compatible with a tiny simple cysts. No other suspicious hepatic lesions are noted. No intra or extrahepatic biliary ductal dilatation. Gallbladder is normal in appearance. Pancreas: In the tail of the pancreas there is a 1.3 x 1.0 cm well-defined T1 hypointense, T2 hyperintense lesion which does not restrict diffusion, and demonstrates no internal enhancement on post gadolinium imaging, favored to represent a tiny pancreatic pseudocyst. No other larger more suspicious appearing pancreatic mass. No pancreatic ductal dilatation. No pancreatic or peripancreatic fluid or inflammatory changes.  Spleen:  Unremarkable. Adrenals/Urinary Tract: 3.1 cm exophytic T1 hypointense, T2 hyperintense, nonenhancing lesion in the lower pole of the left kidney compatible with a simple cyst. No other suspicious renal lesions. No hydroureteronephrosis in the visualized abdomen. Bilateral adrenal glands are normal in appearance. Stomach/Bowel: Visualized portions are unremarkable. Vascular/Lymphatic: No aneurysm identified in the visualized abdominal vasculature. Portal vein is mildly dilated measuring 16 mm in diameter, suggesting underlying portal hypertension. Several small distal esophageal varices are noted. IVC filter in position with tip terminating immediately below the level of the renal veins. No lymphadenopathy noted in the abdomen. Other: No significant volume of ascites in the visualized portions of the peritoneal cavity. Musculoskeletal: No aggressive osseous lesions are noted in the visualized portions of the skeleton. Compression fracture of superior endplate of L1 again noted, with very mild compression on the left side (approximately 10%). IMPRESSION: 1. The lesion of concern in segment 7 of the liver has indeterminate imaging characteristics. Overall, this lesion is favored to be benign, likely an atypical hemangioma, however, a repeat MRI of the abdomen with and without IV gadolinium is recommended in 3 months to ensure the stability of this lesion. 2. Stigmata of cirrhosis redemonstrated. Mild dilatation of the portal vein, indicative of portal venous hypertension. Small distal esophageal varices are also noted. 3. 1.3 x 1.0 cm cystic lesion in the tail of the pancreas. This is nonspecific, but strongly favored to be benign. Repeat abdominal MRI with and without IV gadolinium with MRCP is recommended in 2 years to ensure the stability or resolution of this finding. This recommendation follows ACR consensus guidelines: Management of Incidental Pancreatic Cysts: A White Paper of the ACR Incidental  Findings Committee. J Am Coll Radiol 2017;14:911-923. 4. Additional incidental findings, as above. Electronically Signed   By: Trudie Reed M.D.   On: 05/30/2016 11:43   Ct Abdomen Pelvis W Contrast  Result Date: 05/28/2016 CLINICAL DATA:  Fall from ladder this morning. Low back pain. History of ventral hernia repair. EXAM: CT ABDOMEN AND PELVIS WITH CONTRAST TECHNIQUE: Multidetector CT imaging of the abdomen and pelvis was performed using the standard  protocol following bolus administration of intravenous contrast. CONTRAST:  ISOVUE-300 IOPAMIDOL (ISOVUE-300) INJECTION 61% COMPARISON:  01/16/2016 abdominal radiograph. 07/12/2009 CT abdomen/pelvis. FINDINGS: Lower chest: No significant pulmonary nodules or acute consolidative airspace disease. Coronary atherosclerosis. Hepatobiliary: Liver surface is diffusely irregular with relative hypertrophy of the lateral segment left liver and caudate lobes, compatible cirrhosis. Peripheral segment 7 right liver lobe 2.8 x 2.4 cm hypoenhancing mass (series 3/ image 21). subcentimeter hypodense lateral segment left liver lobe lesion. No additional liver lesions. No liver laceration. Normal gallbladder with no radiopaque cholelithiasis. No biliary ductal dilatation. Pancreas: Heterogeneous fatty infiltration of the pancreas. Cystic 1.2 cm pancreatic tail lesion (series 3/image 28). No additional pancreatic lesions. No pancreatic duct dilation. Scattered calcifications throughout the pancreas are favored to represent vascular calcifications. Spleen: Normal size. No laceration or mass. Adrenals/Urinary Tract: Normal adrenals. No hydronephrosis. No renal laceration. Indeterminate hypodense partially calcified 2.6 x 1.8 cm posterior interpolar left renal lesion (series 3/ image 42). Simple 3.4 cm anterior lower left renal cyst. Nonobstructing 9 mm interpolar and 9 mm lower right renal stones. Nonobstructing 11 mm lower left renal stone. Normal bladder. Stomach/Bowel:  Grossly normal stomach. Normal caliber small bowel with no small bowel wall thickening. Appendectomy. Normal large bowel with no diverticulosis, large bowel wall thickening or pericolonic fat stranding. Vascular/Lymphatic: Atherosclerotic nonaneurysmal abdominal aorta. IVC filter is in place below the level of the renal veins. Patent portal, splenic and renal veins. Small gastroesophageal and paraumbilical varices. No pathologically enlarged lymph nodes in the abdomen or pelvis. Reproductive: Moderately enlarged prostate with nonspecific internal prostatic calcifications. Other: No pneumoperitoneum, ascites or focal fluid collection. No evidence of a recurrent ventral abdominal hernia status post mesh repair. Musculoskeletal: No aggressive appearing focal osseous lesions. Mild compression fracture of the superior L1 vertebral body with minimal 20% loss of vertebral body height, probably acute. No additional fracture. Moderate thoracolumbar spondylosis. IMPRESSION: 1. Mild L1 vertebral compression fracture, probably acute. No additional acute traumatic injury in the abdomen or pelvis. 2. Cirrhosis. Hypoenhancing 2.8 cm segment 7 right liver lobe mass, cannot exclude hepatocellular carcinoma. MRI abdomen without and with IV contrast recommended on a short term outpatient basis for further characterization. 3. Small gastroesophageal and paraumbilical varices. No ascites. Normal size spleen. 4. Cystic 1.2 cm pancreatic tail lesion, which can also be further characterized at MRI abdomen without and with IV contrast. 5. Indeterminate low-attenuation posterior interpolar left renal mass, which can also be further characterized at MRI abdomen without and with IV contrast. 6. Additional findings include aortic atherosclerosis, coronary atherosclerosis, nonobstructing bilateral nephrolithiasis and moderate prostatomegaly. Electronically Signed   By: Delbert Phenix M.D.   On: 05/28/2016 12:25    Assessment/Plan:   Closed  compression fracture of L1 lumbar vertebra with routine healing, subsequent encounter  Closed compression fracture of body of lumbar vertebra (HCC)  Controlled type 2 diabetes mellitus with diabetic nephropathy, without long-term current use of insulin (HCC)  Essential hypertension  Paroxysmal atrial fibrillation (HCC)  Other cirrhosis of liver (HCC)  Pancreatic cyst  Hyperlipidemia, unspecified hyperlipidemia type  Thrombocytopenia (HCC)  Presence of inferior vena cava filter   Patient is being discharged with the following home health services:  OT/PT/Nursing  Patient is being discharged with the following durable medical equipment:  RW,BSC, 3 in 1 tub/shower bench  Patient has been advised to f/u with their PCP in 1-2 weeks to bring them up to date on their rehab stay.  Social services at facility was responsible for arranging this appointment.  Pt was provided with a 30 day supply of prescriptions for medications and refills must be obtained from their PCP.  For controlled substances, a more limited supply may be provided adequate until PCP appointment only.  Future labs/tests needed: PCP to f/u with new finding of pancreatic cyst and liver cirrhosis. Needs PT/INR end of week.  Medications have been reconciled.  Time spent > 30 min;> 50% of time with patient was spent reviewing records, labs, tests and studies, counseling and developing plan of care  [name]. Lyn Hollingshead, MD

## 2017-01-18 ENCOUNTER — Other Ambulatory Visit: Payer: Self-pay | Admitting: Urology

## 2017-01-19 ENCOUNTER — Other Ambulatory Visit: Payer: Self-pay

## 2017-01-19 ENCOUNTER — Encounter (HOSPITAL_BASED_OUTPATIENT_CLINIC_OR_DEPARTMENT_OTHER): Payer: Self-pay

## 2017-01-19 NOTE — Progress Notes (Signed)
Spoke with: Dustin AugustaJackie Harmon NPO: After Midnight Arrival time: [id number] Labs: ISTAT4, PT/INR AM medications: Losartan Pre op orders: Yes Ride home: Rev. Ray McKessonWhittington (pastor) will give number DOS

## 2017-01-25 NOTE — H&P (Signed)
CC/HPI: I have blood in my urine.     [name] was here on 12/28/16 with urethral bleeding for about a week. He had a culture that had Mx species. He was given Cipro and the bleeding cleared. He has known renal stones with a prior hematoma following ESWL. He has had no flank pain. His urine is always abnormal. he had a CT that showed stones but no obstruction. He has a left renal cyst and some extra renal calcifications. He has an enlarged prostate with stones but no bladder lesions. He has no dysuria. He has no other voiding complaints other than nocturia x 1.     ALLERGIES: Zithromax Z-Pak CAPS    MEDICATIONS: Glimepiride 2 mg tablet Oral  Lisinopril 30 mg tablet Oral  Losartan Potassium 100 mg tablet Oral  Metformin Hcl Er 500 mg tablet, extended release 24 hr Oral  Simvastatin TABS Oral  Warfarin Sodium 2 mg tablet Oral     GU PSH: Cysto Uretero Lithotripsy - 2010 Cystoscopy Insert Stent - 2010 Cystoscopy Ureteroscopy - 2009 ESWL - 2007 Locm 300-399Mg /Ml Iodine,1Ml - 12/30/2016      PSH Notes: Colon Surgery, Cystoscopy With Insertion Of Ureteral Stent Left, Cystoscopy With Pyeloscopy With Lithotripsy, Cystoscopy With Ureteroscopy, Back Surgery, Hand Surgery, Lithotripsy   NON-GU PSH: None   GU PMH: Gross hematuria - 12/28/2016 Microscopic hematuria (Stable), He has some increase in the microhematuria and pyuria from last year but not much changed from prior years. - 01/16/2016 Renal calculus (Worsening), His stones are slightly larger but remain asymptomatic. - 01/16/2016, Bilateral kidney stones, - 08/08/2014 Other microscopic hematuria, Microscopic hematuria - 2015 Urinary Tract Inf, Unspec site, Pyuria - 2015, Pyuria, - 2014 Bladder-neck stenosis/contracture, Bladder neck contracture - 2014 History of urolithiasis, Nephrolithiasis - 2014 Personal Hx Oth male genital organs diseases, History of balanitis - 2014 Phimosis, Phimosis - 2014      PMH Notes:  2006-01-01 16:34:03 -  Note: Venous Thrombosis  2008-03-07 10:33:30 - Note: Flank Pain Left   NON-GU PMH: Encounter for general adult medical examination without abnormal findings, Encounter for preventive health examination - 2015 Congenital single renal cyst, Congenital renal cyst, single - 2014 Glycosuria, Glycosuria - 2014 Personal history of other diseases of the circulatory system, History of hypertension - 2014 Personal history of other diseases of the digestive system, History of diverticulitis of colon - 2014 Personal history of other endocrine, nutritional and metabolic disease, History of hypercholesterolemia - 2014 Ventral hernia without obstruction or gangrene, Ventral hernia - 2014    FAMILY HISTORY: Family Health Status Number - Runs In Family No pertinent family history - Other   SOCIAL HISTORY: Marital Status: Divorced Preferred Language: English; Ethnicity: Not Hispanic Or Latino; Race: White     Notes: Retired, Never A Smoker, Marital History - Single, Tobacco Use   REVIEW OF SYSTEMS:    GU Review Male:   Patient reports get up at night to urinate and erection problems. Patient denies frequent urination, hard to postpone urination, burning/ pain with urination, leakage of urine, stream starts and stops, trouble starting your stream, have to strain to urinate , and penile pain.  Gastrointestinal (Upper):   Patient denies nausea, indigestion/ heartburn, and vomiting.  Gastrointestinal (Lower):   Patient denies diarrhea and constipation.  Constitutional:   Patient denies fever, night sweats, weight loss, and fatigue.  Skin:   Patient denies skin rash/ lesion and itching.  Eyes:   Patient denies blurred vision and double vision.  Ears/ Nose/ Throat:  Patient denies sore throat and sinus problems.  Hematologic/Lymphatic:   Patient denies swollen glands and easy bruising.  Cardiovascular:   Patient denies leg swelling and chest pains.  Respiratory:   Patient denies cough and shortness of breath.   Endocrine:   Patient denies excessive thirst.  Musculoskeletal:   Patient denies back pain and joint pain.  Neurological:   Patient denies headaches and dizziness.  Psychologic:   Patient denies depression and anxiety.   VITAL SIGNS:      01/13/2017 01:49 PM  BP 156/87 mmHg  Pulse 94 /min  Temperature 97.5 F / 36.3 C   PAST DATA REVIEWED:  Source Of History:  Patient  Urine Test Review:   Urinalysis, Urine Culture  X-Ray Review: C.T. Hematuria: Reviewed Films. Reviewed Report. Discussed With Patient.     12/15/07  PSA  Total PSA 1.56     PROCEDURES:         Flexible Cystoscopy - 52000  Risks, benefits, and some of the potential complications of the procedure were discussed. 10ml of 2% lidocaine jelly was instilled intraurethrally.  Cipro 500mg  given for antibiotic prophylaxis.     Meatus:  Normal size. Normal location. Normal condition.  Urethra:  No strictures.  External Sphincter:  Normal.  Verumontanum:  Normal.  Prostate:  Obstructing. Moderate hyperplasia.  Bladder Neck:  Non-obstructing.  Ureteral Orifices:  Normal location. Normal size. Normal shape. Effluxed clear urine.  Bladder:  Mild trabeculation. Erythematous mucosa on the left trigone and lateral wall. No tumors. No stones. Urine was cloudy.      The procedure was well tolerated and there were no complications.         Urinalysis w/Scope Dipstick Dipstick Cont'd Micro  Color: Amber Bilirubin: Neg WBC/hpf: >60/hpf  Appearance: Cloudy Ketones: Neg RBC/hpf: >60/hpf  Specific Gravity: 1.020 Blood: 3+ Bacteria: NS (Not Seen)  pH: <=5.0 Protein: Trace Cystals: NS (Not Seen)  Glucose: 2+ Urobilinogen: 0.2 Casts: NS (Not Seen)    Nitrites: Neg Trichomonas: Not Present    Leukocyte Esterase: 2+ Mucous: Not Present      Epithelial Cells: NS (Not Seen)      Yeast: NS (Not Seen)      Sperm: Not Present    ASSESSMENT:      ICD-10 Details  1 GU:   Gross hematuria - R31.0 He had hematuria that sounded urethral  but on cystoscopy he has erythema of the left bladder wall and trigone that could be inflammatory but CIS needs to be ruled out. I will send a cytology and get him set up for an outpatient cystoscopy with biopsy. I reviewed the risks of bleeding, infection, bladder injury, thrombotic events and anesthetic complications.   2   Renal calculus - N20.0 He has bilateral non-obstructive stones.      PLAN:           Orders Labs Urine Cytology, CULTURE, URINE          Schedule Return Visit/Planned Activity: Keep Scheduled Appointment - Schedule Surgery          Document Letter(s):  Created for Patient: Clinical Summary         Notes:   CC: Dr. Vianne Bulls. Alan Ross.

## 2017-01-26 ENCOUNTER — Ambulatory Visit (HOSPITAL_BASED_OUTPATIENT_CLINIC_OR_DEPARTMENT_OTHER)
Admission: RE | Admit: 2017-01-26 | Discharge: 2017-01-26 | Disposition: A | Discharge status: Home / Self Care | Payer: Medicare Other | Source: Ambulatory Visit | Attending: Urology | Admitting: Urology

## 2017-01-26 ENCOUNTER — Ambulatory Visit (HOSPITAL_BASED_OUTPATIENT_CLINIC_OR_DEPARTMENT_OTHER): Payer: Medicare Other | Admitting: Anesthesiology

## 2017-01-26 ENCOUNTER — Encounter (HOSPITAL_BASED_OUTPATIENT_CLINIC_OR_DEPARTMENT_OTHER): Payer: Self-pay

## 2017-01-26 ENCOUNTER — Encounter (HOSPITAL_BASED_OUTPATIENT_CLINIC_OR_DEPARTMENT_OTHER)
Admission: RE | Disposition: A | Discharge status: Home / Self Care | Payer: Self-pay | Source: Ambulatory Visit | Attending: Urology

## 2017-01-26 DIAGNOSIS — N3091 Cystitis, unspecified with hematuria: Secondary | ICD-10-CM | POA: Insufficient documentation

## 2017-01-26 DIAGNOSIS — Z9889 Other specified postprocedural states: Secondary | ICD-10-CM | POA: Diagnosis not present

## 2017-01-26 DIAGNOSIS — Z7984 Long term (current) use of oral hypoglycemic drugs: Secondary | ICD-10-CM | POA: Insufficient documentation

## 2017-01-26 DIAGNOSIS — N289 Disorder of kidney and ureter, unspecified: Secondary | ICD-10-CM | POA: Insufficient documentation

## 2017-01-26 DIAGNOSIS — E119 Type 2 diabetes mellitus without complications: Secondary | ICD-10-CM | POA: Diagnosis not present

## 2017-01-26 DIAGNOSIS — M1990 Unspecified osteoarthritis, unspecified site: Secondary | ICD-10-CM | POA: Insufficient documentation

## 2017-01-26 DIAGNOSIS — I4891 Unspecified atrial fibrillation: Secondary | ICD-10-CM | POA: Insufficient documentation

## 2017-01-26 DIAGNOSIS — E7800 Pure hypercholesterolemia, unspecified: Secondary | ICD-10-CM | POA: Diagnosis not present

## 2017-01-26 DIAGNOSIS — Z79899 Other long term (current) drug therapy: Secondary | ICD-10-CM | POA: Insufficient documentation

## 2017-01-26 DIAGNOSIS — I10 Essential (primary) hypertension: Secondary | ICD-10-CM | POA: Insufficient documentation

## 2017-01-26 DIAGNOSIS — N200 Calculus of kidney: Secondary | ICD-10-CM | POA: Diagnosis not present

## 2017-01-26 DIAGNOSIS — Z7901 Long term (current) use of anticoagulants: Secondary | ICD-10-CM | POA: Insufficient documentation

## 2017-01-26 DIAGNOSIS — N329 Bladder disorder, unspecified: Secondary | ICD-10-CM | POA: Diagnosis present

## 2017-01-26 HISTORY — DX: Hepatomegaly, not elsewhere classified: R16.0

## 2017-01-26 HISTORY — DX: Other thrombophilia: D68.69

## 2017-01-26 HISTORY — PX: CYSTOSCOPY WITH BIOPSY: SHX5122

## 2017-01-26 HISTORY — DX: Cyst of pancreas: K86.2

## 2017-01-26 HISTORY — DX: Other pulmonary embolism without acute cor pulmonale: I26.99

## 2017-01-26 HISTORY — DX: Diverticulitis of intestine, part unspecified, with perforation and abscess without bleeding: K57.80

## 2017-01-26 HISTORY — DX: Personal history of urinary calculi: Z87.442

## 2017-01-26 HISTORY — DX: Acute embolism and thrombosis of unspecified deep veins of unspecified lower extremity: I82.409

## 2017-01-26 HISTORY — DX: Dyspnea, unspecified: R06.00

## 2017-01-26 HISTORY — DX: Unspecified cirrhosis of liver: K74.60

## 2017-01-26 HISTORY — DX: Diverticulitis of intestine, part unspecified, without perforation or abscess without bleeding: K57.92

## 2017-01-26 HISTORY — DX: Insomnia, unspecified: G47.00

## 2017-01-26 LAB — HEMOGLOBIN A1C
Hgb A1c MFr Bld: 8.1 % — ABNORMAL HIGH (ref 4.8–5.6)
MEAN PLASMA GLUCOSE: 185.77 mg/dL

## 2017-01-26 LAB — POCT I-STAT 4, (NA,K, GLUC, HGB,HCT)
GLUCOSE: 186 mg/dL — AB (ref 65–99)
HCT: 42 % (ref 39.0–52.0)
Hemoglobin: 14.3 g/dL (ref 13.0–17.0)
POTASSIUM: 3.9 mmol/L (ref 3.5–5.1)
Sodium: 141 mmol/L (ref 135–145)

## 2017-01-26 LAB — PROTIME-INR
INR: 1.28
PROTHROMBIN TIME: 15.8 s — AB (ref 11.4–15.2)

## 2017-01-26 LAB — GLUCOSE, CAPILLARY: Glucose-Capillary: 157 mg/dL — ABNORMAL HIGH (ref 65–99)

## 2017-01-26 SURGERY — CYSTOSCOPY, WITH BIOPSY
Anesthesia: General

## 2017-01-26 MED ORDER — MIDAZOLAM HCL 2 MG/2ML IJ SOLN
INTRAMUSCULAR | Status: DC | PRN
  Administered 2017-01-26: 2 mg via INTRAVENOUS

## 2017-01-26 MED ORDER — PROPOFOL 10 MG/ML IV BOLUS
INTRAVENOUS | Status: DC | PRN
  Administered 2017-01-26: 130 mg via INTRAVENOUS

## 2017-01-26 MED ORDER — LACTATED RINGERS IV SOLN
INTRAVENOUS | Status: DC
  Administered 2017-01-26 (×2): via INTRAVENOUS
  Filled 2017-01-26: qty 1000

## 2017-01-26 MED ORDER — LIDOCAINE 2% (20 MG/ML) 5 ML SYRINGE
INTRAMUSCULAR | Status: AC
  Filled 2017-01-26: qty 5

## 2017-01-26 MED ORDER — CEFAZOLIN SODIUM-DEXTROSE 2-4 GM/100ML-% IV SOLN
2.0000 g | INTRAVENOUS | Status: AC
  Administered 2017-01-26: 2 g via INTRAVENOUS
  Filled 2017-01-26: qty 100

## 2017-01-26 MED ORDER — CEFAZOLIN SODIUM-DEXTROSE 2-4 GM/100ML-% IV SOLN
INTRAVENOUS | Status: AC
  Filled 2017-01-26: qty 100

## 2017-01-26 MED ORDER — FENTANYL CITRATE (PF) 100 MCG/2ML IJ SOLN
INTRAMUSCULAR | Status: DC | PRN
  Administered 2017-01-26: 50 ug via INTRAVENOUS

## 2017-01-26 MED ORDER — LIDOCAINE 2% (20 MG/ML) 5 ML SYRINGE
INTRAMUSCULAR | Status: DC | PRN
  Administered 2017-01-26: 100 mg via INTRAVENOUS

## 2017-01-26 MED ORDER — ONDANSETRON HCL 4 MG/2ML IJ SOLN
INTRAMUSCULAR | Status: AC
  Filled 2017-01-26: qty 2

## 2017-01-26 MED ORDER — DEXAMETHASONE SODIUM PHOSPHATE 10 MG/ML IJ SOLN
INTRAMUSCULAR | Status: DC | PRN
  Administered 2017-01-26: 5 mg via INTRAVENOUS

## 2017-01-26 MED ORDER — ONDANSETRON HCL 4 MG/2ML IJ SOLN
INTRAMUSCULAR | Status: DC | PRN
  Administered 2017-01-26: 4 mg via INTRAVENOUS

## 2017-01-26 MED ORDER — PROPOFOL 10 MG/ML IV BOLUS
INTRAVENOUS | Status: AC
  Filled 2017-01-26: qty 20

## 2017-01-26 MED ORDER — FENTANYL CITRATE (PF) 100 MCG/2ML IJ SOLN
INTRAMUSCULAR | Status: AC
  Filled 2017-01-26: qty 2

## 2017-01-26 MED ORDER — DEXAMETHASONE SODIUM PHOSPHATE 10 MG/ML IJ SOLN
INTRAMUSCULAR | Status: AC
  Filled 2017-01-26: qty 1

## 2017-01-26 MED ORDER — KETOROLAC TROMETHAMINE 30 MG/ML IJ SOLN
INTRAMUSCULAR | Status: AC
  Filled 2017-01-26: qty 1

## 2017-01-26 SURGICAL SUPPLY — 23 items
BAG DRAIN URO-CYSTO SKYTR STRL (DRAIN) ×3 IMPLANT
BAG DRN UROCATH (DRAIN) ×1
CATH FOLEY 2WAY SLVR  5CC 16FR (CATHETERS)
CATH FOLEY 2WAY SLVR 5CC 16FR (CATHETERS) IMPLANT
CLOTH BEACON ORANGE TIMEOUT ST (SAFETY) ×3 IMPLANT
ELECT REM PT RETURN 9FT ADLT (ELECTROSURGICAL) ×3
ELECTRODE REM PT RTRN 9FT ADLT (ELECTROSURGICAL) ×1 IMPLANT
GLOVE SURG SS PI 8.0 STRL IVOR (GLOVE) ×3 IMPLANT
GOWN STRL REUS W/ TWL LRG LVL3 (GOWN DISPOSABLE) ×1 IMPLANT
GOWN STRL REUS W/ TWL XL LVL3 (GOWN DISPOSABLE) ×1 IMPLANT
GOWN STRL REUS W/TWL LRG LVL3 (GOWN DISPOSABLE) ×3
GOWN STRL REUS W/TWL XL LVL3 (GOWN DISPOSABLE) ×3
KIT RM TURNOVER CYSTO AR (KITS) ×3 IMPLANT
MANIFOLD NEPTUNE II (INSTRUMENTS) ×2 IMPLANT
NDL SAFETY ECLIPSE 18X1.5 (NEEDLE) IMPLANT
NEEDLE HYPO 18GX1.5 SHARP (NEEDLE)
NEEDLE HYPO 22GX1.5 SAFETY (NEEDLE) IMPLANT
NS IRRIG 500ML POUR BTL (IV SOLUTION) IMPLANT
PACK CYSTO (CUSTOM PROCEDURE TRAY) ×3 IMPLANT
SYR 20CC LL (SYRINGE) IMPLANT
TUBE CONNECTING 12'X1/4 (SUCTIONS) ×1
TUBE CONNECTING 12X1/4 (SUCTIONS) ×1 IMPLANT
WATER STERILE IRR 3000ML UROMA (IV SOLUTION) ×3 IMPLANT

## 2017-01-26 NOTE — Interval H&P Note (Signed)
History and Physical Interval Note:  01/26/2017 10:30 AM  [name]  has presented today for surgery, with the diagnosis of BLADDER LESION  The various methods of treatment have been discussed with the patient and family. After consideration of risks, benefits and other options for treatment, the patient has consented to  Procedure(s): CYSTOSCOPY WITH BIOPSY AND FULGURATION (N/A) as a surgical intervention .  The patient's history has been reviewed, patient examined, no change in status, stable for surgery.  I have reviewed the patient's chart and labs.  Questions were answered to the patient's satisfaction.     Bjorn PippinJohn Daylon Lafavor

## 2017-01-26 NOTE — Anesthesia Preprocedure Evaluation (Addendum)
Anesthesia Evaluation  Patient identified by MRN, date of birth, ID band Patient awake    Reviewed: Allergy & Precautions, H&P , NPO status , Patient's Chart, lab work & pertinent test results  Airway Mallampati: III  TM Distance: <3 FB Neck ROM: Full  Mouth opening: Limited Mouth Opening  Dental  (+) Teeth Intact, Poor Dentition, Chipped, Dental Advisory Given,  Discolored teeth/ hairline Fx present:   Pulmonary neg pulmonary ROS,    Pulmonary exam normal breath sounds clear to auscultation       Cardiovascular hypertension, Pt. on medications Normal cardiovascular exam Rhythm:Regular Rate:Normal  Atrial Fibrillation Off of coumadin x 5 days   Neuro/Psych Short term memory loss, & loss of peripheral vision CVA, Residual Symptoms negative psych ROS   GI/Hepatic negative GI ROS, Neg liver ROS,   Endo/Other  diabetes, Type 2, Oral Hypoglycemic Agents  Renal/GU Renal disease     Musculoskeletal negative musculoskeletal ROS (+) Arthritis , Osteoarthritis,    Abdominal (+) + obese,   Peds  Hematology negative hematology ROS (+)   Anesthesia Other Findings   Reproductive/Obstetrics negative OB ROS                          Anesthesia Physical  Anesthesia Plan  ASA: III  Anesthesia Plan: General   Post-op Pain Management:    Induction: Intravenous  PONV Risk Score and Plan: 3 and Ondansetron and Treatment may vary due to age or medical condition  Airway Management Planned: LMA  Additional Equipment:   Intra-op Plan:   Post-operative Plan: Extubation in OR  Informed Consent: I have reviewed the patients History and Physical, chart, labs and discussed the procedure including the risks, benefits and alternatives for the proposed anesthesia with the patient or authorized representative who has indicated his/her understanding and acceptance.   Dental advisory given  Plan Discussed with:  CRNA  Anesthesia Plan Comments:         Anesthesia Quick Evaluation

## 2017-01-26 NOTE — Transfer of Care (Signed)
Immediate Anesthesia Transfer of Care Note  Patient: Lilyan GilfordJackie L Arther  Procedure(s) Performed: CYSTOSCOPY (N/A )  Patient Location: PACU  Anesthesia Type:General  Level of Consciousness: awake, alert , oriented and patient cooperative  Airway & Oxygen Therapy: Patient Spontanous Breathing and Patient connected to nasal cannula oxygen  Post-op Assessment: Report given to RN and Post -op Vital signs reviewed and stable  Post vital signs: Reviewed and stable  Last Vitals:  Vitals:   01/26/17 0849  BP: (!) 142/69  Pulse: 82  Resp: 18  Temp: 36.4 C  SpO2: 100%    Last Pain:  Vitals:   01/26/17 0849  TempSrc: Oral      Patients Stated Pain Goal: 6 (01/26/17 0919)  Complications: No apparent anesthesia complications

## 2017-01-26 NOTE — Anesthesia Procedure Notes (Signed)
Procedure Name: LMA Insertion Date/Time: 01/26/2017 10:45 AM Performed by: Tyrone NineSauve, Nabria Nevin F, CRNA Pre-anesthesia Checklist: Patient identified, Timeout performed, Emergency Drugs available and Suction available Patient Re-evaluated:Patient Re-evaluated prior to induction Oxygen Delivery Method: Circle system utilized Preoxygenation: Pre-oxygenation with 100% oxygen Induction Type: IV induction Ventilation: Mask ventilation without difficulty LMA: LMA inserted LMA Size: 4.0 Number of attempts: 1 Placement Confirmation: breath sounds checked- equal and bilateral and positive ETCO2 Dental Injury: Teeth and Oropharynx as per pre-operative assessment

## 2017-01-26 NOTE — Op Note (Signed)
Procedure: Cystoscopy.  Preop diagnosis: Bladder wall lesion.  Postop diagnosis: Resolving cystitis.  Surgeon: Dr. Bjorn PippinJohn Asuncion Tapscott.  Anesthesia: General.  Drain: None.  Specimen: None.  Blood loss: None.  Complications: None.  Indications: [name] is a 77 year old white male who was recently found to have erythema on the wall of his bladder during evaluation for urinary abnormalities.  A culture grew multiple species but he was treated with antibiotics in preparation for the procedure.  His cytology was normal.  It was felt that cystoscopy and possible biopsy was indicated.  Procedure: He was given Ancef.  He was taken operating room where general anesthetic was induced.  He was placed in lithotomy position.  He was fitted with PAS hose.  His perineum and genitalia were prepped with Betadine solution.  He was draped in usual sterile fashion.  Cystoscopy was performed with the 30 degree lens through the 23 [place] scope.  Examination revealed a normal urethra.  The external sphincter was intact.  The prostatic urethra was 2-3 cm in length with trilobar hyperplasia with mild obstruction.  Examination of bladder wall revealed moderate trabeculation with a few cellules.  Mucosa was pale with a few areas of increased vascular density in the trigone but nothing suspicious for neoplasm.  No papillary lesions or stones were seen.  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  The ureteral orifice ease were unremarkable.  After inspection with the 30 degree lens, the 70 degree lens was used to complete the evaluation.  No additional abnormalities were noted.  At this point it was felt that biopsy was not indicated and the erythema that is been noted previously was likely inflammatory and improved with antibiotics.  Patient was taken down from the lithotomy position after his bladder was drained.  He was moved to recovery room in stable condition after his anesthesia was  reversed.  There were no complications.

## 2017-01-26 NOTE — Discharge Instructions (Addendum)
CYSTOSCOPY HOME CARE INSTRUCTIONS  Activity: Rest for the remainder of the day.  Do not drive or operate equipment today.  You may resume normal activities in one to two days as instructed by your physician.   Meals: Drink plenty of liquids and eat light foods such as gelatin or soup this evening.  You may return to a normal meal plan tomorrow.  Return to Work: You may return to work in one to two days or as instructed by your physician.  Special Instructions / Symptoms: Call your physician if any of these symptoms occur:   -persistent or heavy bleeding  -bleeding which continues after first few urination  -large blood clots that are difficult to pass  -urine stream diminishes or stops completely  -fever equal to or higher than 101 degrees Farenheit.  -cloudy urine with a strong, foul odor  -severe pain  Females should always wipe from front to back after elimination.  You may feel some burning pain when you urinate.  This should disappear with time.  Applying moist heat to the lower abdomen or a hot tub bath may help relieve the pain. \  The red area on the bladder had cleared up with the ampicillin.   Please continue that med nightly until you have used the 3 months of refills.  You may resume the warfarin.    Post Anesthesia Home Care Instructions  Activity: Get plenty of rest for the remainder of the day. A responsible individual must stay with you for 24 hours following the procedure.  For the next 24 hours, DO NOT: -Drive a car -Advertising copywriterperate machinery -Drink alcoholic beverages -Take any medication unless instructed by your physician -Make any legal decisions or sign important papers.  Meals: Start with liquid foods such as gelatin or soup. Progress to regular foods as tolerated. Avoid greasy, spicy, heavy foods. If nausea and/or vomiting occur, drink only clear liquids until the nausea and/or vomiting subsides. Call your physician if vomiting continues.  Special  Instructions/Symptoms: Your throat may feel dry or sore from the anesthesia or the breathing tube placed in your throat during surgery. If this causes discomfort, gargle with warm salt water. The discomfort should disappear within 24 hours.

## 2017-01-27 ENCOUNTER — Encounter (HOSPITAL_BASED_OUTPATIENT_CLINIC_OR_DEPARTMENT_OTHER): Payer: Self-pay | Admitting: Urology

## 2017-01-27 NOTE — Anesthesia Postprocedure Evaluation (Signed)
Anesthesia Post Note  Patient: [name]  Procedure(s) Performed: CYSTOSCOPY (N/A )     Patient location during evaluation: PACU Anesthesia Type: General Level of consciousness: sedated and patient cooperative Pain management: pain level controlled Vital Signs Assessment: post-procedure vital signs reviewed and stable Respiratory status: spontaneous breathing Cardiovascular status: stable Anesthetic complications: no    Last Vitals:  Vitals:   01/26/17 1145 01/26/17 1200  BP: 139/81 140/80  Pulse: 82 81  Resp: 19 18  Temp:  (!) 36.4 C  SpO2: 97% 99%    Last Pain:  Vitals:   01/26/17 1200  TempSrc: Oral                 Lewie LoronJohn Kandy Towery

## 2017-10-15 ENCOUNTER — Observation Stay (HOSPITAL_COMMUNITY): Payer: Medicare Other | Admitting: Anesthesiology

## 2017-10-15 ENCOUNTER — Observation Stay (HOSPITAL_COMMUNITY): Payer: Medicare Other

## 2017-10-15 ENCOUNTER — Other Ambulatory Visit: Payer: Self-pay

## 2017-10-15 ENCOUNTER — Observation Stay (HOSPITAL_COMMUNITY)
Admission: EM | Admit: 2017-10-15 | Discharge: 2017-10-16 | Disposition: A | Discharge status: Home / Self Care | Payer: Medicare Other | Attending: Urology | Admitting: Urology

## 2017-10-15 ENCOUNTER — Emergency Department (HOSPITAL_COMMUNITY): Payer: Medicare Other

## 2017-10-15 ENCOUNTER — Encounter (HOSPITAL_COMMUNITY)
Admission: EM | Disposition: A | Discharge status: Home / Self Care | Payer: Self-pay | Source: Home / Self Care | Attending: Emergency Medicine

## 2017-10-15 ENCOUNTER — Encounter (HOSPITAL_COMMUNITY): Payer: Self-pay | Admitting: Emergency Medicine

## 2017-10-15 DIAGNOSIS — Z86711 Personal history of pulmonary embolism: Secondary | ICD-10-CM | POA: Insufficient documentation

## 2017-10-15 DIAGNOSIS — N201 Calculus of ureter: Secondary | ICD-10-CM | POA: Diagnosis present

## 2017-10-15 DIAGNOSIS — I4891 Unspecified atrial fibrillation: Secondary | ICD-10-CM | POA: Diagnosis not present

## 2017-10-15 DIAGNOSIS — M4856XA Collapsed vertebra, not elsewhere classified, lumbar region, initial encounter for fracture: Secondary | ICD-10-CM | POA: Insufficient documentation

## 2017-10-15 DIAGNOSIS — N132 Hydronephrosis with renal and ureteral calculous obstruction: Principal | ICD-10-CM | POA: Insufficient documentation

## 2017-10-15 DIAGNOSIS — Z8673 Personal history of transient ischemic attack (TIA), and cerebral infarction without residual deficits: Secondary | ICD-10-CM | POA: Insufficient documentation

## 2017-10-15 DIAGNOSIS — I700 Atherosclerosis of aorta: Secondary | ICD-10-CM | POA: Insufficient documentation

## 2017-10-15 DIAGNOSIS — Z86718 Personal history of other venous thrombosis and embolism: Secondary | ICD-10-CM | POA: Diagnosis not present

## 2017-10-15 DIAGNOSIS — D696 Thrombocytopenia, unspecified: Secondary | ICD-10-CM | POA: Diagnosis not present

## 2017-10-15 DIAGNOSIS — I8500 Esophageal varices without bleeding: Secondary | ICD-10-CM | POA: Insufficient documentation

## 2017-10-15 DIAGNOSIS — E785 Hyperlipidemia, unspecified: Secondary | ICD-10-CM | POA: Insufficient documentation

## 2017-10-15 DIAGNOSIS — M5126 Other intervertebral disc displacement, lumbar region: Secondary | ICD-10-CM | POA: Insufficient documentation

## 2017-10-15 DIAGNOSIS — Z888 Allergy status to other drugs, medicaments and biological substances status: Secondary | ICD-10-CM | POA: Insufficient documentation

## 2017-10-15 DIAGNOSIS — Z881 Allergy status to other antibiotic agents status: Secondary | ICD-10-CM | POA: Diagnosis not present

## 2017-10-15 DIAGNOSIS — K5900 Constipation, unspecified: Secondary | ICD-10-CM | POA: Insufficient documentation

## 2017-10-15 DIAGNOSIS — Z79899 Other long term (current) drug therapy: Secondary | ICD-10-CM | POA: Diagnosis not present

## 2017-10-15 DIAGNOSIS — M48061 Spinal stenosis, lumbar region without neurogenic claudication: Secondary | ICD-10-CM | POA: Diagnosis not present

## 2017-10-15 DIAGNOSIS — I10 Essential (primary) hypertension: Secondary | ICD-10-CM | POA: Diagnosis not present

## 2017-10-15 DIAGNOSIS — K7460 Unspecified cirrhosis of liver: Secondary | ICD-10-CM | POA: Diagnosis not present

## 2017-10-15 DIAGNOSIS — G4700 Insomnia, unspecified: Secondary | ICD-10-CM | POA: Insufficient documentation

## 2017-10-15 DIAGNOSIS — Z7984 Long term (current) use of oral hypoglycemic drugs: Secondary | ICD-10-CM | POA: Insufficient documentation

## 2017-10-15 DIAGNOSIS — Z87442 Personal history of urinary calculi: Secondary | ICD-10-CM | POA: Diagnosis not present

## 2017-10-15 DIAGNOSIS — E119 Type 2 diabetes mellitus without complications: Secondary | ICD-10-CM | POA: Insufficient documentation

## 2017-10-15 DIAGNOSIS — Z23 Encounter for immunization: Secondary | ICD-10-CM | POA: Insufficient documentation

## 2017-10-15 DIAGNOSIS — Z9049 Acquired absence of other specified parts of digestive tract: Secondary | ICD-10-CM | POA: Insufficient documentation

## 2017-10-15 DIAGNOSIS — Z7901 Long term (current) use of anticoagulants: Secondary | ICD-10-CM | POA: Diagnosis not present

## 2017-10-15 DIAGNOSIS — Z9889 Other specified postprocedural states: Secondary | ICD-10-CM | POA: Insufficient documentation

## 2017-10-15 HISTORY — PX: CYSTOSCOPY WITH STENT PLACEMENT: SHX5790

## 2017-10-15 LAB — CBC WITH DIFFERENTIAL/PLATELET
Abs Immature Granulocytes: 0.04 10*3/uL (ref 0.00–0.07)
BASOS ABS: 0 10*3/uL (ref 0.0–0.1)
BASOS PCT: 0 %
EOS ABS: 0.1 10*3/uL (ref 0.0–0.5)
EOS PCT: 2 %
HCT: 42.1 % (ref 39.0–52.0)
Hemoglobin: 14 g/dL (ref 13.0–17.0)
IMMATURE GRANULOCYTES: 1 %
Lymphocytes Relative: 19 %
Lymphs Abs: 1.6 10*3/uL (ref 0.7–4.0)
MCH: 31.2 pg (ref 26.0–34.0)
MCHC: 33.3 g/dL (ref 30.0–36.0)
MCV: 93.8 fL (ref 80.0–100.0)
Monocytes Absolute: 0.8 10*3/uL (ref 0.1–1.0)
Monocytes Relative: 9 %
NEUTROS PCT: 69 %
NRBC: 0 % (ref 0.0–0.2)
Neutro Abs: 5.6 10*3/uL (ref 1.7–7.7)
PLATELETS: 133 10*3/uL — AB (ref 150–400)
RBC: 4.49 MIL/uL (ref 4.22–5.81)
RDW: 14 % (ref 11.5–15.5)
WBC: 8.1 10*3/uL (ref 4.0–10.5)

## 2017-10-15 LAB — URINALYSIS, ROUTINE W REFLEX MICROSCOPIC
Bilirubin Urine: NEGATIVE
Glucose, UA: >=500 mg/dL — AB
KETONES UR: NEGATIVE mg/dL
NITRITE: NEGATIVE
PH: 5 (ref 5.0–8.0)
Protein, ur: NEGATIVE mg/dL
RBC / HPF: >50 RBC/hpf — ABNORMAL HIGH (ref 0–5)
SPECIFIC GRAVITY, URINE: 1.015 (ref 1.005–1.030)

## 2017-10-15 LAB — PROTIME-INR
INR: 2.22
Prothrombin Time: 24.5 seconds — ABNORMAL HIGH (ref 11.4–15.2)

## 2017-10-15 LAB — GLUCOSE, CAPILLARY
GLUCOSE-CAPILLARY: 298 mg/dL — AB (ref 70–99)
Glucose-Capillary: 261 mg/dL — ABNORMAL HIGH (ref 70–99)
Glucose-Capillary: 219 mg/dL — ABNORMAL HIGH (ref 70–99)
Glucose-Capillary: 199 mg/dL — ABNORMAL HIGH (ref 70–99)

## 2017-10-15 LAB — BASIC METABOLIC PANEL
ANION GAP: 11 (ref 5–15)
BUN: 17 mg/dL (ref 8–23)
CO2: 20 mmol/L — ABNORMAL LOW (ref 22–32)
Calcium: 9.6 mg/dL (ref 8.9–10.3)
Chloride: 110 mmol/L (ref 98–111)
Creatinine, Ser: 0.84 mg/dL (ref 0.61–1.24)
GFR calc non Af Amer: >60 mL/min (ref >60)
Glucose, Bld: 208 mg/dL — ABNORMAL HIGH (ref 70–99)
Potassium: 4.1 mmol/L (ref 3.5–5.1)
SODIUM: 141 mmol/L (ref 135–145)

## 2017-10-15 LAB — CBG MONITORING, ED: GLUCOSE-CAPILLARY: 238 mg/dL — AB (ref 70–99)

## 2017-10-15 SURGERY — CYSTOSCOPY, WITH STENT INSERTION
Anesthesia: General | Site: Ureter | Laterality: Right

## 2017-10-15 MED ORDER — MEPERIDINE HCL 50 MG/ML IJ SOLN: 6.2500 mg | INTRAMUSCULAR | Status: DC | PRN

## 2017-10-15 MED ORDER — FENTANYL CITRATE (PF) 100 MCG/2ML IJ SOLN: 25.0000 ug | INTRAMUSCULAR | Status: DC | PRN

## 2017-10-15 MED ORDER — SODIUM CHLORIDE 0.9 % IV SOLN: 1.0000 g | INTRAVENOUS | Status: DC

## 2017-10-15 MED ORDER — DIPHENHYDRAMINE HCL 50 MG/ML IJ SOLN: 12.5000 mg | Freq: Four times a day (QID) | INTRAMUSCULAR | Status: DC | PRN

## 2017-10-15 MED ORDER — WARFARIN SODIUM 2.5 MG PO TABS
2.5000 mg | ORAL_TABLET | Freq: Every day | ORAL | Status: DC
  Administered 2017-10-15: 2.5 mg via ORAL
  Filled 2017-10-15: qty 1

## 2017-10-15 MED ORDER — FENTANYL CITRATE (PF) 100 MCG/2ML IJ SOLN
50.0000 ug | Freq: Once | INTRAMUSCULAR | Status: AC
  Administered 2017-10-15: 50 ug via INTRAVENOUS
  Filled 2017-10-15: qty 2

## 2017-10-15 MED ORDER — HYDROCODONE-ACETAMINOPHEN 5-325 MG PO TABS: 1.0000 | ORAL_TABLET | Freq: Four times a day (QID) | ORAL | 0 refills | Status: DC | PRN

## 2017-10-15 MED ORDER — HYDROCODONE-ACETAMINOPHEN 5-325 MG PO TABS
1.0000 | ORAL_TABLET | Freq: Four times a day (QID) | ORAL | Status: DC | PRN
  Administered 2017-10-15: 2 via ORAL
  Filled 2017-10-15: qty 2

## 2017-10-15 MED ORDER — SODIUM CHLORIDE 0.9 % IV SOLN
2.0000 g | INTRAVENOUS | Status: AC
  Administered 2017-10-15: 2 g via INTRAVENOUS
  Filled 2017-10-15: qty 20

## 2017-10-15 MED ORDER — LIDOCAINE HCL (CARDIAC) PF 100 MG/5ML IV SOSY
PREFILLED_SYRINGE | INTRAVENOUS | Status: DC | PRN
  Administered 2017-10-15: 60 mg via INTRAVENOUS

## 2017-10-15 MED ORDER — METOCLOPRAMIDE HCL 5 MG/ML IJ SOLN: 10.0000 mg | Freq: Once | INTRAMUSCULAR | Status: DC | PRN

## 2017-10-15 MED ORDER — ONDANSETRON HCL 4 MG/2ML IJ SOLN: 4.0000 mg | INTRAMUSCULAR | Status: DC | PRN

## 2017-10-15 MED ORDER — ONDANSETRON HCL 4 MG/2ML IJ SOLN
INTRAMUSCULAR | Status: DC | PRN
  Administered 2017-10-15: 4 mg via INTRAVENOUS

## 2017-10-15 MED ORDER — INFLUENZA VAC SPLIT HIGH-DOSE 0.5 ML IM SUSY
0.5000 mL | PREFILLED_SYRINGE | INTRAMUSCULAR | Status: AC
  Administered 2017-10-16: 0.5 mL via INTRAMUSCULAR
  Filled 2017-10-15: qty 0.5

## 2017-10-15 MED ORDER — FENTANYL CITRATE (PF) 100 MCG/2ML IJ SOLN
50.0000 ug | Freq: Once | INTRAMUSCULAR | Status: AC
Start: 2017-10-15 — End: 2017-10-15
  Administered 2017-10-15: 50 ug via INTRAVENOUS
  Filled 2017-10-15: qty 2

## 2017-10-15 MED ORDER — ONDANSETRON HCL 4 MG/2ML IJ SOLN
INTRAMUSCULAR | Status: AC
  Filled 2017-10-15: qty 2

## 2017-10-15 MED ORDER — FENTANYL CITRATE (PF) 100 MCG/2ML IJ SOLN
INTRAMUSCULAR | Status: AC
  Filled 2017-10-15: qty 2

## 2017-10-15 MED ORDER — HYDROMORPHONE HCL 1 MG/ML IJ SOLN
0.5000 mg | INTRAMUSCULAR | Status: DC | PRN
  Administered 2017-10-15: 1 mg via INTRAVENOUS
  Filled 2017-10-15: qty 1

## 2017-10-15 MED ORDER — SIMVASTATIN 40 MG PO TABS
40.0000 mg | ORAL_TABLET | Freq: Every evening | ORAL | Status: DC
  Administered 2017-10-15: 40 mg via ORAL
  Filled 2017-10-15: qty 1

## 2017-10-15 MED ORDER — PROPOFOL 10 MG/ML IV BOLUS
INTRAVENOUS | Status: DC | PRN
  Administered 2017-10-15: 150 mg via INTRAVENOUS

## 2017-10-15 MED ORDER — EPHEDRINE SULFATE 50 MG/ML IJ SOLN
INTRAMUSCULAR | Status: DC | PRN
  Administered 2017-10-15: 5 mg via INTRAVENOUS

## 2017-10-15 MED ORDER — WARFARIN SODIUM 2 MG PO TABS: 2.0000 mg | ORAL_TABLET | Freq: Every day | ORAL | Status: DC

## 2017-10-15 MED ORDER — INSULIN ASPART 100 UNIT/ML ~~LOC~~ SOLN
0.0000 [IU] | SUBCUTANEOUS | Status: DC
  Administered 2017-10-15 (×2): 8 [IU] via SUBCUTANEOUS
  Administered 2017-10-16: 3 [IU] via SUBCUTANEOUS
  Administered 2017-10-16: 2 [IU] via SUBCUTANEOUS
  Administered 2017-10-16: 5 [IU] via SUBCUTANEOUS

## 2017-10-15 MED ORDER — SODIUM CHLORIDE 0.9 % IR SOLN
Status: DC | PRN
  Administered 2017-10-15: 3000 mL

## 2017-10-15 MED ORDER — DIPHENHYDRAMINE HCL 12.5 MG/5ML PO ELIX: 12.5000 mg | ORAL_SOLUTION | Freq: Four times a day (QID) | ORAL | Status: DC | PRN

## 2017-10-15 MED ORDER — PROPOFOL 10 MG/ML IV BOLUS
INTRAVENOUS | Status: AC
  Filled 2017-10-15: qty 20

## 2017-10-15 MED ORDER — ONDANSETRON HCL 4 MG PO TABS: 4.0000 mg | ORAL_TABLET | Freq: Three times a day (TID) | ORAL | 0 refills | Status: AC | PRN

## 2017-10-15 MED ORDER — LACTATED RINGERS IV SOLN
INTRAVENOUS | Status: DC
  Administered 2017-10-15 (×2): via INTRAVENOUS

## 2017-10-15 MED ORDER — LOSARTAN POTASSIUM 50 MG PO TABS
100.0000 mg | ORAL_TABLET | Freq: Every morning | ORAL | Status: DC
  Administered 2017-10-16: 100 mg via ORAL
  Filled 2017-10-15: qty 2

## 2017-10-15 MED ORDER — WARFARIN - PHYSICIAN DOSING INPATIENT
Freq: Every day | Status: DC
  Administered 2017-10-15: 19:00:00

## 2017-10-15 MED ORDER — LOSARTAN POTASSIUM 50 MG PO TABS
100.0000 mg | ORAL_TABLET | Freq: Once | ORAL | Status: AC
  Administered 2017-10-15: 100 mg via ORAL
  Filled 2017-10-15: qty 2

## 2017-10-15 MED ORDER — SODIUM CHLORIDE 0.9 % IV SOLN
INTRAVENOUS | Status: DC
  Administered 2017-10-15: 13:00:00 via INTRAVENOUS

## 2017-10-15 MED ORDER — FENTANYL CITRATE (PF) 100 MCG/2ML IJ SOLN
INTRAMUSCULAR | Status: DC | PRN
  Administered 2017-10-15 (×2): 25 ug via INTRAVENOUS
  Administered 2017-10-15: 50 ug via INTRAVENOUS

## 2017-10-15 MED ORDER — CIPROFLOXACIN IN D5W 400 MG/200ML IV SOLN
400.0000 mg | Freq: Once | INTRAVENOUS | Status: AC
  Administered 2017-10-15: 400 mg via INTRAVENOUS
  Filled 2017-10-15: qty 200

## 2017-10-15 SURGICAL SUPPLY — 12 items
BAG URO CATCHER STRL LF (MISCELLANEOUS) ×3 IMPLANT
CATH INTERMIT  6FR 70CM (CATHETERS) ×3 IMPLANT
CLOTH BEACON ORANGE TIMEOUT ST (SAFETY) ×1 IMPLANT
COVER WAND RF STERILE (DRAPES) IMPLANT
GLOVE BIOGEL M STRL SZ7.5 (GLOVE) ×7 IMPLANT
GOWN STRL REUS W/TWL LRG LVL3 (GOWN DISPOSABLE) ×6 IMPLANT
GUIDEWIRE STR DUAL SENSOR (WIRE) ×3 IMPLANT
MANIFOLD NEPTUNE II (INSTRUMENTS) ×3 IMPLANT
PACK CYSTO (CUSTOM PROCEDURE TRAY) ×3 IMPLANT
STENT URET 6FRX24 CONTOUR (STENTS) ×2 IMPLANT
TUBING CONNECTING 10 (TUBING) ×2 IMPLANT
TUBING CONNECTING 10' (TUBING) ×1

## 2017-10-15 NOTE — ED Provider Notes (Signed)
[facility]-EMERGENCY DEPT Provider Note   CSN: [id number] Arrival date & time: 10/15/17  0118     History   Chief Complaint Chief Complaint  Patient presents with  . Flank Pain    right    HPI [name] is a 77 y.o. male with past medical history of diabetes, diverticulosis, kidney stones, who presents to ED for evaluation of 12-hour history of right-sided flank pain.  Describes the pain as sharp and radiated down to his groin.  He had one episode of nonbloody, nonbilious emesis when the pain was severe, approximately 7 hours ago.  He reports associated diarrhea and constipation.  He is currently pain-free.  States that it feels similar to his prior kidney stones.  States that he has had numerous kidney stones throughout his life and has underwent many procedures.  Dr. Wilson Singer is his urologist.  Last procedure done for kidney stone was about 2 to 3 years ago.  He denies any injuries or falls, dysuria, hematuria, fever, shortness of breath, chest pain.  HPI  Past Medical History:  Diagnosis Date  . Cirrhosis (HCC)    noted on CT 05/28/16  . Closed compression fracture of L1 lumbar vertebra 05/2016  . Diabetes mellitus   . Diverticulitis   . DVT (deep venous thrombosis) (HCC) 2002  . Dyspnea    with exertion  . History of kidney stones   . Hyperlipidemia   . Hypertension   . Insomnia   . Kidney stones    hx of  . Liver mass, right lobe    noted on CT 05/28/16  . Pancreatic cyst    noted on CT 05/28/16  . Perforated diverticulum   . Presence of inferior vena cava filter 2002  . Pulmonary embolism (HCC) 2002  . Secondary hypercoagulability disorder (HCC)   . Stroke [facility]) 2002   right eye no peripheral vision, short term memory loss    Patient Active Problem List   Diagnosis Date Noted  . Right ureteral stone 10/15/2017  . Cirrhosis of liver (HCC) 06/07/2016  . Pancreatic cyst 06/07/2016  . AF (atrial fibrillation) (HCC) 06/07/2016  .  Hyperlipidemia 06/07/2016  . History of stroke 06/07/2016  . Thrombocytopenia (HCC) 06/07/2016  . Closed compression fracture of L1 lumbar vertebra 05/28/2016  . HTN (hypertension) 05/28/2016  . DM II (diabetes mellitus, type II), controlled (HCC) 05/28/2016  . Closed compression fracture of body of lumbar vertebra (HCC) 05/28/2016  . Incisional hernia x 2.  Repaired 9/60/2013. 08/21/2011  . Presence of inferior vena cava filter 01/06/2000    Past Surgical History:  Procedure Laterality Date  . APPENDECTOMY  1949  . BACK SURGERY    . CARPAL TUNNEL RELEASE  2002 both wrists  . COLON SURGERY  07/19/2009   sigmoid colectomy   . CYSTOSCOPY WITH BIOPSY N/A 01/26/2017   Procedure: CYSTOSCOPY;  Surgeon: Bjorn Pippin, MD;  Location: [facility];  Service: Urology;  Laterality: N/A;  . HERNIA REPAIR   1983, 1983, 02/03/2010   lap ventral hernia repair  . KIDNEY STONE SURGERY  1977, 1983  . LITHOTRIPSY  2010   bleeding in kidney after  . surgery for diverticulosis  2011  . TONSILLECTOMY  1947  . TONSILLECTOMY AND ADENOIDECTOMY    . VENTRAL HERNIA REPAIR  09/11/2011   Procedure: LAPAROSCOPIC VENTRAL HERNIA;  Surgeon: Kandis Cocking, MD;  Location: [facility];  Service: General;  Laterality: N/A;  Lapaaroscopic Repair Ventral Incisional Hernias x 2  Home Medications    Prior to Admission medications   Medication Sig Start Date End Date Taking? Authorizing Provider  glimepiride (AMARYL) 4 MG tablet Take 4 mg by mouth every morning.  03/10/10  Yes [provider]  losartan (COZAAR) 100 MG tablet Take 100 mg by mouth every morning.    Yes [provider]  metFORMIN (GLUCOPHAGE) 500 MG tablet Take 1,000 mg by mouth 2 (two) times daily.    Yes [provider]  simvastatin (ZOCOR) 40 MG tablet Take 40 mg by mouth every evening.    Yes [provider]  warfarin (COUMADIN) 2 MG tablet Take 2 mg by mouth daily. 3 MG on three days and 2 MG on the  other 4 days   Yes [provider]    Family History Family History  Problem Relation Age of Onset  . Cancer Neg Hx     Social History Social History   Tobacco Use  . Smoking status: Never Smoker  . Smokeless tobacco: Never Used  Substance Use Topics  . Alcohol use: No  . Drug use: No     Allergies   Prednisone and Zithromax [azithromycin]   Review of Systems Review of Systems  Constitutional: Negative for appetite change, chills and fever.  HENT: Negative for ear pain, rhinorrhea, sneezing and sore throat.   Eyes: Negative for photophobia and visual disturbance.  Respiratory: Negative for cough, chest tightness, shortness of breath and wheezing.   Cardiovascular: Negative for chest pain and palpitations.  Gastrointestinal: Positive for nausea and vomiting. Negative for abdominal pain, blood in stool, constipation and diarrhea.  Genitourinary: Positive for flank pain. Negative for dysuria, hematuria and urgency.  Musculoskeletal: Negative for myalgias.  Skin: Negative for rash.  Neurological: Negative for dizziness, weakness and light-headedness.     Physical Exam Updated Vital Signs BP (!) 156/91   Pulse 75   Temp 98.3 F (36.8 C) (Oral)   Resp 20   Ht 5\' 4"  (1.626 m)   Wt 75.8 kg   SpO2 96%   BMI 28.67 kg/m   Physical Exam  Constitutional: He appears well-developed and well-nourished. No distress.  HENT:  Head: Normocephalic and atraumatic.  Nose: Nose normal.  Eyes: Conjunctivae and EOM are normal. Right eye exhibits no discharge. Left eye exhibits no discharge. No scleral icterus.  Neck: Normal range of motion. Neck supple.  Cardiovascular: Normal rate, regular rhythm, normal heart sounds and intact distal pulses. Exam reveals no gallop and no friction rub.  No murmur heard. Pulmonary/Chest: Effort normal and breath sounds normal. No respiratory distress.  Abdominal: Soft. Bowel sounds are normal. He exhibits no distension. There is  tenderness (R flank). There is no guarding.  Musculoskeletal: Normal range of motion. He exhibits no edema.  Neurological: He is alert. He exhibits normal muscle tone. Coordination normal.  Skin: Skin is warm and dry. No rash noted.  Psychiatric: He has a normal mood and affect.  Nursing note and vitals reviewed.    ED Treatments / Results  Labs (all labs ordered are listed, but only abnormal results are displayed) Labs Reviewed  URINALYSIS, ROUTINE W REFLEX MICROSCOPIC - Abnormal; Notable for the following components:      Result Value   APPearance HAZY (*)    Glucose, UA >=500 (*)    Hgb urine dipstick LARGE (*)    Leukocytes, UA LARGE (*)    RBC / HPF >50 (*)    WBC, UA >50 (*)    Bacteria, UA RARE (*)  All other components within normal limits  BASIC METABOLIC PANEL - Abnormal; Notable for the following components:   CO2 20 (*)    Glucose, Bld 208 (*)    All other components within normal limits  CBC WITH DIFFERENTIAL/PLATELET - Abnormal; Notable for the following components:   Platelets 133 (*)    All other components within normal limits  CBG MONITORING, ED - Abnormal; Notable for the following components:   Glucose-Capillary 238 (*)    All other components within normal limits  URINE CULTURE  PROTIME-INR  CBG MONITORING, ED    EKG None  Radiology Ct Renal Stone Study  Result Date: 10/15/2017 CLINICAL DATA:  Right flank pain EXAM: CT ABDOMEN AND PELVIS WITHOUT CONTRAST TECHNIQUE: Multidetector CT imaging of the abdomen and pelvis was performed following the standard protocol without IV contrast. COMPARISON:  December 30, 2016 CT abdomen and pelvis; abdominal MRI May 29, 2016 FINDINGS: Lower chest: There is mild scarring in the lung bases. There are foci of coronary artery calcification. There are mildly prominent vascular structures in the periesophageal region, presumed varices. Hepatobiliary: Liver contour is nodular, consistent with underlying hepatic  cirrhosis. There is a stable wedge-shaped area of decreased attenuation along the periphery of the liver measuring 2.4 x 1.9 cm. No new liver lesions are evident on this noncontrast enhanced study. Gallbladder wall is not appreciably thickened. There is no biliary duct dilatation. Pancreas: There is no pancreatic mass or inflammatory focus. Spleen: No splenic lesions are appreciable on this noncontrast enhanced study. Spleen measures 13.3 x 12.6 x 7.1 cm with a measured splenic volume of 595 cubic cm. Adrenals/Urinary Tract: Adrenals bilaterally appear normal. There is a stable calcified lesion along the posterior aspect of the lower pole of the left kidney measuring 1.4 x 1.4 cm, benign in appearance. There is a cyst arising more anteriorly from the lower pole left kidney measuring 3.4 x 3.3 cm. There is moderate hydronephrosis on the right. There is no hydronephrosis on the left. There is a calculus in a lower pole left renal calyx measuring 1.2 x 1.1 cm. A calculus more superiorly in the left kidney measures 0.9 x 0.9 cm. There is a 1 mm nearby calculus in the upper pole left kidney. There is a 2 mm calculus also in the upper pole left kidney. On the right, there is a calculus in a lower pole calyx measuring 1.6 x 1.1 cm. There is a calculus at the right ureteropelvic junction measuring 1.1 x 0.8 cm. No other ureteral calculi are evident. Urinary bladder is midline with wall thickness within normal limits. Stomach/Bowel: There is no appreciable bowel wall thickening. There is no evident bowel obstruction. There is no free air or portal venous air. Vascular/Lymphatic: There is a filter in the inferior vena cava. There is aortoiliac atherosclerosis. No evident aneurysm. Major mesenteric arterial vessels appear patent. No adenopathy is appreciable in the abdomen or pelvis. Reproductive: There are multiple prostatic calculi. Prostate is upper normal in size. Seminal vesicles appear normal. No well-defined pelvic  mass. Other: Appendix absent. There is mesh in the lower abdominal region anteriorly consistent with previous hernia repair. No hernia currently apparent. There is thinning of the lateral rectus muscle on the left with fatty infiltration in this area, stable. There is no abscess or ascites in the abdomen or pelvis. There is stranding in the mesentery in the left lower quadrant which does not appear associated with bowel. There is no abnormal fluid in this area. Musculoskeletal: There is evidence  of previous collapse of the L1 vertebral body with mild retropulsion of bone posteriorly, stable. There is multilevel arthropathy in the lower thoracic and lumbar spine regions. There is mild spinal stenosis at the L1 level due to the retropulsion from previous fracture. There is severe spinal stenosis at L3-4 and L4-5 due to diffuse disc protrusion and bony hypertrophy. Moderate stenosis due to similar factors is noted at L2-3. Bones are osteoporotic. No blastic or lytic bone lesions. There are no evident intramuscular lesions. IMPRESSION: 1. Moderate hydronephrosis on the right. There is a calculus at the right ureteropelvic junction measuring 1.1 x 0.8 cm causing obstruction. 2. Multiple intrarenal calculi, more on the left than on the right. Several prostatic calculi also noted. 3. Hepatic cirrhosis, unchanged in appearance. Stable wedge-shaped lesion in the posterior segment right lobe of the liver, benign in appearance. 4. Somewhat nonspecific appearing mesenteric thickening in the left lower quadrant which appear separate from bowel. 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 There is no adenopathy or fluid in this area. 5. Severe spinal stenosis at L3-4 and L4-5 due to bony hypertrophy and diffuse disc protrusion. Moderate stenosis due to similar factors noted at L2-3. Bones osteoporotic. Extensive multilevel arthropathy. Stable compression fracture at L1 with mild retropulsion of bone into the canal causing mild  spinal stenosis at this level. 6.  Filter in the inferior vena cava. 7.  Splenic prominence.  There are periesophageal varices. 8.  Aortoiliac atherosclerosis. Aortic Atherosclerosis (ICD10-I70.0). Electronically Signed   By: Bretta Bang III M.D.   On: 10/15/2017 07:30    Procedures Procedures (including critical care time)  Medications Ordered in ED Medications  fentaNYL (SUBLIMAZE) injection 50 mcg (50 mcg Intravenous Given 10/15/17 0756)  ciprofloxacin (CIPRO) IVPB 400 mg (0 mg Intravenous Stopped 10/15/17 0944)  fentaNYL (SUBLIMAZE) injection 50 mcg (50 mcg Intravenous Given 10/15/17 0946)  losartan (COZAAR) tablet 100 mg (100 mg Oral Given 10/15/17 1019)     Initial Impression / Assessment and Plan / ED Course  I have reviewed the triage vital signs and the nursing notes.  Pertinent labs & imaging results that were available during my care of the patient were reviewed by me and considered in my medical decision making (see chart for details).  Clinical Course as of Oct 15 1109  Fri Oct 15, 2017  0719 Patient requesting pain medication after CT scan.   [HK]  0720 Leukocytes, UA(!): LARGE [HK]  0720 Bacteria, UA(!): RARE [HK]  0802 Spoke to Dr. Laverle Patter of urology.  He recommends pain control here in the ED and calling the urology office after discharge here to schedule a possible outpatient procedure with Dr. Annabell Howells.   [HK]  0815 Improvement in pain with fentanyl.   [HK]  [id number] Patient's pain returned despite pain medication. Will need to repage urology for update.   [HK]  1020 Urology to evaluate patient here in ED.   [HK]  1111 77yo M with a past medical history of HTN, prior CVA, presents to ED for evaluation of R flank pain since last night. Reports numerous kidney stones requiring numerous procedures throughout his life. He states this feels similar. One episode of NBNB emesis 2/2 pain several hours ago. On my initial exam patient was pain free. UA significant for  hematuria and pyuria and large leukocytes, rare bacteria. Nitrite negative. BMP shows normal renal function. CBC unremarkable. He is afebrile with no history of fever. Patient's pain returned after CT renal study done- shows almost 1x1cm stone at RUPJ  causing obstruction.  Dr. Laverle Patter to admit (urology).   [HK]    Clinical Course User Index [HK] Dietrich Pates, PA-C    Final Clinical Impressions(s) / ED Diagnoses   Final diagnoses:  Ureterolithiasis    ED Discharge Orders    None       Dietrich Pates, PA-C 10/15/17 1112    Gerhard Munch, MD 10/16/17 [phone]

## 2017-10-15 NOTE — Progress Notes (Signed)
ED TO INPATIENT HANDOFF REPORT  Name/Age/Gender [name] 77 y.o. male  Code Status Code Status History    Date Active Date Inactive Code Status Order ID Comments User Context   05/28/2016 1757 06/01/2016 1938 DNR [id number]  [name], MD Inpatient   05/28/2016 1502 05/28/2016 1757 Full Code [id number]  [name], MD ED    Questions for Most Recent Historical Code Status (Order [id number])    Question Answer Comment   In the event of cardiac or respiratory ARREST Do not call a "code blue"    In the event of cardiac or respiratory ARREST Do not perform Intubation, CPR, defibrillation or ACLS    In the event of cardiac or respiratory ARREST Use medication by any route, position, wound care, and other measures to relive pain and suffering. May use oxygen, suction and manual treatment of airway obstruction as needed for comfort.         Advance Directive Documentation     Most Recent Value  Type of Advance Directive  Healthcare Power of Attorney  Pre-existing out of facility DNR order (yellow form or pink MOST form)  -  "MOST" Form in Place?  -      Home/SNF/Other Home  Chief Complaint Flank Pain   Level of Care/Admitting Diagnosis ED Disposition    ED Disposition Condition [place]: [place] [[id number]]  Level of Care: Med-Surg [16]  Diagnosis: Right ureteral stone [222979]  Admitting Physician: [name] [[id number]]  Attending Physician: [name] [[id number]]  PT Class (Do Not Modify): Observation [104]  PT Acc Code (Do Not Modify): Observation [10022]       Medical History Past Medical History:  Diagnosis Date  . Cirrhosis ([place])    noted on CT 05/28/16  . Closed compression fracture of L1 lumbar vertebra 05/2016  . Diabetes mellitus   . Diverticulitis   . DVT (deep venous thrombosis) ([place]) 2002  . Dyspnea    with exertion  . History of kidney stones   . Hyperlipidemia   . Hypertension   . Insomnia   .  Kidney stones    hx of  . Liver mass, right lobe    noted on CT 05/28/16  . Pancreatic cyst    noted on CT 05/28/16  . Perforated diverticulum   . Presence of inferior vena cava filter 2002  . Pulmonary embolism ([place]) 2002  . Secondary hypercoagulability disorder ([place])   . Stroke [facility]) 2002   right eye no peripheral vision, short term memory loss    Allergies Allergies  Allergen Reactions  . Prednisone     Other reaction(s): Other (See Comments) unknown  . Zithromax [Azithromycin] Other (See Comments)    Weak and flulike symptoms    IV Location/Drains/Wounds Patient Lines/Drains/Airways Status   Active Line/Drains/Airways    Name:   Placement date:   Placement time:   Site:   Days:   Peripheral IV 10/15/17 Right Wrist   10/15/17    0755    Wrist   less than 1   Incision (Closed) 01/26/17 Penis   01/26/17    0949     262          Labs/Imaging Results for orders placed or performed during the hospital encounter of 10/15/17 (from the past 48 hour(s))  Urinalysis, Routine w reflex microscopic- may I&O cath if menses     Status: Abnormal   Collection Time: 10/15/17  6:30 AM  Result Value Ref Range   Color, Urine YELLOW YELLOW   APPearance HAZY (A) CLEAR   Specific Gravity, Urine 1.015 1.005 - 1.030   pH 5.0 5.0 - 8.0   Glucose, UA >=500 (A) NEGATIVE mg/dL   Hgb urine dipstick LARGE (A) NEGATIVE   Bilirubin Urine NEGATIVE NEGATIVE   Ketones, ur NEGATIVE NEGATIVE mg/dL   Protein, ur NEGATIVE NEGATIVE mg/dL   Nitrite NEGATIVE NEGATIVE   Leukocytes, UA LARGE (A) NEGATIVE   RBC / HPF >50 (H) 0 - 5 RBC/hpf   WBC, UA >50 (H) 0 - 5 WBC/hpf   Bacteria, UA RARE (A) NONE SEEN   Mucus PRESENT     Comment: Performed at [facility], [place] [address], [place], [place] [address]  Basic metabolic panel     Status: Abnormal   Collection Time: 10/15/17  6:30 AM  Result Value Ref Range   Sodium 141 135 - 145 mmol/L   Potassium 4.1 3.5 - 5.1 mmol/L   Chloride 110 98  - 111 mmol/L   CO2 20 (L) 22 - 32 mmol/L   Glucose, Bld 208 (H) 70 - 99 mg/dL   BUN 17 8 - 23 mg/dL   Creatinine, Ser 0.84 0.61 - 1.24 mg/dL   Calcium 9.6 8.9 - 10.3 mg/dL   GFR calc non Af Amer >60 >60 mL/min   GFR calc Af Amer >60 >60 mL/min    Comment: (NOTE) The eGFR has been calculated using the CKD EPI equation. This calculation has not been validated in all clinical situations. eGFR's persistently <60 mL/min signify possible Chronic Kidney Disease.    Anion gap 11 5 - 15    Comment: Performed at [facility], [place] [address], [place], [place] [address]  CBC with Differential     Status: Abnormal   Collection Time: 10/15/17  6:30 AM  Result Value Ref Range   WBC 8.1 4.0 - 10.5 K/uL   RBC 4.49 4.22 - 5.81 MIL/uL   Hemoglobin 14.0 13.0 - 17.0 g/dL   HCT 42.1 39.0 - 52.0 %   MCV 93.8 80.0 - 100.0 fL   MCH 31.2 26.0 - 34.0 pg   MCHC 33.3 30.0 - 36.0 g/dL   RDW 14.0 11.5 - 15.5 %   Platelets 133 (L) 150 - 400 K/uL   nRBC 0.0 0.0 - 0.2 %   Neutrophils Relative % 69 %   Neutro Abs 5.6 1.7 - 7.7 K/uL   Lymphocytes Relative 19 %   Lymphs Abs 1.6 0.7 - 4.0 K/uL   Monocytes Relative 9 %   Monocytes Absolute 0.8 0.1 - 1.0 K/uL   Eosinophils Relative 2 %   Eosinophils Absolute 0.1 0.0 - 0.5 K/uL   Basophils Relative 0 %   Basophils Absolute 0.0 0.0 - 0.1 K/uL   Immature Granulocytes 1 %   Abs Immature Granulocytes 0.04 0.00 - 0.07 K/uL    Comment: Performed at [facility], [place] [address], [place], [place] [address]  POC CBG, ED     Status: Abnormal   Collection Time: 10/15/17  9:37 AM  Result Value Ref Range   Glucose-Capillary 238 (H) 70 - 99 mg/dL  Protime-INR     Status: Abnormal   Collection Time: 10/15/17 10:57 AM  Result Value Ref Range   Prothrombin Time 24.5 (H) 11.4 - 15.2 seconds   INR 2.22     Comment: Performed at [facility], [place] [address], [place], [place] [address]  Ct Renal Stone Study  Result  Date: 10/15/2017 CLINICAL DATA:  Right flank pain EXAM: CT ABDOMEN AND PELVIS WITHOUT CONTRAST TECHNIQUE: Multidetector CT imaging of the abdomen and pelvis was performed following the standard protocol without IV contrast. COMPARISON:  December 30, 2016 CT abdomen and pelvis; abdominal MRI May 29, 2016 FINDINGS: Lower chest: There is mild scarring in the lung bases. There are foci of coronary artery calcification. There are mildly prominent vascular structures in the periesophageal region, presumed varices. Hepatobiliary: Liver contour is nodular, consistent with underlying hepatic cirrhosis. There is a stable wedge-shaped area of decreased attenuation along the periphery of the liver measuring 2.4 x 1.9 cm. No new liver lesions are evident on this noncontrast enhanced study. Gallbladder wall is not appreciably thickened. There is no biliary duct dilatation. Pancreas: There is no pancreatic mass or inflammatory focus. Spleen: No splenic lesions are appreciable on this noncontrast enhanced study. Spleen measures 13.3 x 12.6 x 7.1 cm with a measured splenic volume of 595 cubic cm. Adrenals/Urinary Tract: Adrenals bilaterally appear normal. There is a stable calcified lesion along the posterior aspect of the lower pole of the left kidney measuring 1.4 x 1.4 cm, benign in appearance. There is a cyst arising more anteriorly from the lower pole left kidney measuring 3.4 x 3.3 cm. There is moderate hydronephrosis on the right. There is no hydronephrosis on the left. There is a calculus in a lower pole left renal calyx measuring 1.2 x 1.1 cm. A calculus more superiorly in the left kidney measures 0.9 x 0.9 cm. There is a 1 mm nearby calculus in the upper pole left kidney. There is a 2 mm calculus also in the upper pole left kidney. On the right, there is a calculus in a lower pole calyx measuring 1.6 x 1.1 cm. There is a calculus at the right ureteropelvic junction measuring 1.1 x 0.8 cm. No other ureteral calculi are  evident. Urinary bladder is midline with wall thickness within normal limits. Stomach/Bowel: There is no appreciable bowel wall thickening. There is no evident bowel obstruction. There is no free air or portal venous air. Vascular/Lymphatic: There is a filter in the inferior vena cava. There is aortoiliac atherosclerosis. No evident aneurysm. Major mesenteric arterial vessels appear patent. No adenopathy is appreciable in the abdomen or pelvis. Reproductive: There are multiple prostatic calculi. Prostate is upper normal in size. Seminal vesicles appear normal. No well-defined pelvic mass. Other: Appendix absent. There is mesh in the lower abdominal region anteriorly consistent with previous hernia repair. No hernia currently apparent. There is thinning of the lateral rectus muscle on the left with fatty infiltration in this area, stable. There is no abscess or ascites in the abdomen or pelvis. There is stranding in the mesentery in the left lower quadrant which does not appear associated with bowel. There is no abnormal fluid in this area. Musculoskeletal: There is evidence of previous collapse of the L1 vertebral body with mild retropulsion of bone posteriorly, stable. There is multilevel arthropathy in the lower thoracic and lumbar spine regions. There is mild spinal stenosis at the L1 level due to the retropulsion from previous fracture. There is severe spinal stenosis at L3-4 and L4-5 due to diffuse disc protrusion and bony hypertrophy. Moderate stenosis due to similar factors is noted at L2-3. Bones are osteoporotic. No blastic or lytic bone lesions. There are no evident intramuscular lesions. IMPRESSION: 1. Moderate hydronephrosis on the right. There is a calculus at the right ureteropelvic junction measuring 1.1 x  0.8 cm causing obstruction. 2. Multiple intrarenal calculi, more on the left than on the right. Several prostatic calculi also noted. 3. Hepatic cirrhosis, unchanged in appearance. Stable  wedge-shaped lesion in the posterior segment right lobe of the liver, benign in appearance. 4. Somewhat nonspecific appearing mesenteric thickening in the left lower quadrant which appear separate from bowel. 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 There is no adenopathy or fluid in this area. 5. Severe spinal stenosis at L3-4 and L4-5 due to bony hypertrophy and diffuse disc protrusion. Moderate stenosis due to similar factors noted at L2-3. Bones osteoporotic. Extensive multilevel arthropathy. Stable compression fracture at L1 with mild retropulsion of bone into the canal causing mild spinal stenosis at this level. 6.  Filter in the inferior vena cava. 7.  Splenic prominence.  There are periesophageal varices. 8.  Aortoiliac atherosclerosis. Aortic Atherosclerosis (ICD10-I70.0). Electronically Signed   By: Lowella Grip III M.D.   On: 10/15/2017 07:30    Pending Labs Unresulted Labs (From admission, onward)    Start     Ordered   10/15/17 0625  Urine culture  STAT,   STAT     10/15/17 0624   Signed and Held  Protime-INR  Once,   R    Comments:  If not already drawn    Signed and Held          Vitals/Pain Today's Vitals   10/15/17 1030 10/15/17 1100 10/15/17 1127 10/15/17 1130  BP: (!) 144/78 (!) 156/91 (!) 156/91 (!) 151/74  Pulse: 73 75 74 70  Resp:   15   Temp:      TempSrc:      SpO2: 94% 96% 94% 95%  Weight:      Height:      PainSc:        Isolation Precautions No active isolations  Medications Medications  fentaNYL (SUBLIMAZE) injection 50 mcg (50 mcg Intravenous Given 10/15/17 0756)  ciprofloxacin (CIPRO) IVPB 400 mg (0 mg Intravenous Stopped 10/15/17 0944)  fentaNYL (SUBLIMAZE) injection 50 mcg (50 mcg Intravenous Given 10/15/17 0946)  losartan (COZAAR) tablet 100 mg (100 mg Oral Given 10/15/17 1019)    Mobility walks

## 2017-10-15 NOTE — Transfer of Care (Signed)
Immediate Anesthesia Transfer of Care Note  Patient: [name]  Procedure(s) Performed: CYSTOSCOPY WITH STENT PLACEMENT (Right Ureter)  Patient Location: PACU  Anesthesia Type:General  Level of Consciousness: drowsy, patient cooperative and responds to stimulation  Airway & Oxygen Therapy: Patient Spontanous Breathing and Patient connected to face mask oxygen  Post-op Assessment: Report given to RN and Post -op Vital signs reviewed and stable  Post vital signs: Reviewed and stable  Last Vitals:  Vitals Value Taken Time  BP 137/77 10/15/2017  4:32 PM  Temp    Pulse 92 10/15/2017  4:33 PM  Resp 16 10/15/2017  4:33 PM  SpO2 100 % 10/15/2017  4:33 PM  Vitals shown include unvalidated device data.  Last Pain:  Vitals:   10/15/17 1300  TempSrc:   PainSc: Asleep      Patients Stated Pain Goal: 0 (10/15/17 1610)  Complications: No apparent anesthesia complications

## 2017-10-15 NOTE — H&P (Signed)
H&P  Chief Complaint: Right proximal ureteral stone  History of Present Illness: [name] is a 77 y.o. year old patient of Dr. Annabell Howells who presents to the ED with a one day history of severe right flank and upper abdominal pain associated with severe nausea and vomiting.  He denies fever.  His pain and nausea has persisted despite treatment in the ED.  He has a long history of stones with multiple procedures including ESWL and ureteroscopic treatments.  He is chronically anticoagulated and has an IVC filter and has a prior bleed after ESWL therapy in the past.    Past Medical History:  Diagnosis Date  . Cirrhosis (HCC)    noted on CT 05/28/16  . Closed compression fracture of L1 lumbar vertebra 05/2016  . Diabetes mellitus   . Diverticulitis   . DVT (deep venous thrombosis) (HCC) 2002  . Dyspnea    with exertion  . History of kidney stones   . Hyperlipidemia   . Hypertension   . Insomnia   . Kidney stones    hx of  . Liver mass, right lobe    noted on CT 05/28/16  . Pancreatic cyst    noted on CT 05/28/16  . Perforated diverticulum   . Presence of inferior vena cava filter 2002  . Pulmonary embolism (HCC) 2002  . Secondary hypercoagulability disorder (HCC)   . Stroke [facility]) 2002   right eye no peripheral vision, short term memory loss    Past Surgical History:  Procedure Laterality Date  . APPENDECTOMY  1949  . BACK SURGERY    . CARPAL TUNNEL RELEASE  2002 both wrists  . COLON SURGERY  07/19/2009   sigmoid colectomy   . CYSTOSCOPY WITH BIOPSY N/A 01/26/2017   Procedure: CYSTOSCOPY;  Surgeon: Bjorn Pippin, MD;  Location: [facility];  Service: Urology;  Laterality: N/A;  . HERNIA REPAIR   1983, 1983, 02/03/2010   lap ventral hernia repair  . KIDNEY STONE SURGERY  1977, 1983  . LITHOTRIPSY  2010   bleeding in kidney after  . surgery for diverticulosis  2011  . TONSILLECTOMY  1947  . TONSILLECTOMY AND ADENOIDECTOMY    . VENTRAL HERNIA REPAIR  09/11/2011   Procedure: LAPAROSCOPIC VENTRAL HERNIA;  Surgeon: Kandis Cocking, MD;  Location: [facility];  Service: General;  Laterality: N/A;  Lapaaroscopic Repair Ventral Incisional Hernias x 2    Home Medications:  Current Meds  Medication Sig  . glimepiride (AMARYL) 4 MG tablet Take 4 mg by mouth every morning.   Marland Kitchen losartan (COZAAR) 100 MG tablet Take 100 mg by mouth every morning.   . metFORMIN (GLUCOPHAGE) 500 MG tablet Take 1,000 mg by mouth 2 (two) times daily.   . simvastatin (ZOCOR) 40 MG tablet Take 40 mg by mouth every evening.   . warfarin (COUMADIN) 2 MG tablet Take 2 mg by mouth daily. 3 MG on three days and 2 MG on the other 4 days    Allergies:  Allergies  Allergen Reactions  . Prednisone     Other reaction(s): Other (See Comments) unknown  . Zithromax [Azithromycin] Other (See Comments)    Weak and flulike symptoms    Family History  Problem Relation Age of Onset  . Cancer Neg Hx     Social History:  reports that he has never smoked. He has never used smokeless tobacco. He reports that he does not drink alcohol or use drugs.  ROS: A complete review of systems  was performed.  All systems are negative except for pertinent findings as noted.  Physical Exam:  Vital signs in last 24 hours: Temp:  [98 F (36.7 C)-98.3 F (36.8 C)] 98.3 F (36.8 C) (10/11 0639) Pulse Rate:  [71-78] 73 (10/11 1030) Resp:  [16-20] 20 (10/11 0639) BP: (131-158)/(72-119) 144/78 (10/11 1030) SpO2:  [94 %-98 %] 94 % (10/11 1030) Weight:  [75.8 kg] 75.8 kg (10/11 0631) Constitutional:  Alert and oriented, No acute distress Cardiovascular: Regular rate and rhythm, No JVD Respiratory: Normal respiratory effort, Lungs clear bilaterally GI: Abdomen is obese with tenderness in the RUQ. GU: Moderate right CVA tenderness Lymphatic: No lymphadenopathy Neurologic: Grossly intact, no focal deficits Psychiatric: Normal mood and affect   Laboratory Data:  Recent Labs    10/15/17 0630  WBC 8.1  HGB  14.0  HCT 42.1  PLT 133*    Recent Labs    10/15/17 0630  NA 141  K 4.1  CL 110  GLUCOSE 208*  BUN 17  CALCIUM 9.6  CREATININE 0.84     Results for orders placed or performed during the hospital encounter of 10/15/17 (from the past 24 hour(s))  Urinalysis, Routine w reflex microscopic- may I&O cath if menses     Status: Abnormal   Collection Time: 10/15/17  6:30 AM  Result Value Ref Range   Color, Urine YELLOW YELLOW   APPearance HAZY (A) CLEAR   Specific Gravity, Urine 1.015 1.005 - 1.030   pH 5.0 5.0 - 8.0   Glucose, UA >=500 (A) NEGATIVE mg/dL   Hgb urine dipstick LARGE (A) NEGATIVE   Bilirubin Urine NEGATIVE NEGATIVE   Ketones, ur NEGATIVE NEGATIVE mg/dL   Protein, ur NEGATIVE NEGATIVE mg/dL   Nitrite NEGATIVE NEGATIVE   Leukocytes, UA LARGE (A) NEGATIVE   RBC / HPF >50 (H) 0 - 5 RBC/hpf   WBC, UA >50 (H) 0 - 5 WBC/hpf   Bacteria, UA RARE (A) NONE SEEN   Mucus PRESENT   Basic metabolic panel     Status: Abnormal   Collection Time: 10/15/17  6:30 AM  Result Value Ref Range   Sodium 141 135 - 145 mmol/L   Potassium 4.1 3.5 - 5.1 mmol/L   Chloride 110 98 - 111 mmol/L   CO2 20 (L) 22 - 32 mmol/L   Glucose, Bld 208 (H) 70 - 99 mg/dL   BUN 17 8 - 23 mg/dL   Creatinine, Ser 5.28 0.61 - 1.24 mg/dL   Calcium 9.6 8.9 - 41.3 mg/dL   GFR calc non Af Amer >60 >60 mL/min   GFR calc Af Amer >60 >60 mL/min   Anion gap 11 5 - 15  CBC with Differential     Status: Abnormal   Collection Time: 10/15/17  6:30 AM  Result Value Ref Range   WBC 8.1 4.0 - 10.5 K/uL   RBC 4.49 4.22 - 5.81 MIL/uL   Hemoglobin 14.0 13.0 - 17.0 g/dL   HCT 24.4 01.0 - 27.2 %   MCV 93.8 80.0 - 100.0 fL   MCH 31.2 26.0 - 34.0 pg   MCHC 33.3 30.0 - 36.0 g/dL   RDW 53.6 64.4 - 03.4 %   Platelets 133 (L) 150 - 400 K/uL   nRBC 0.0 0.0 - 0.2 %   Neutrophils Relative % 69 %   Neutro Abs 5.6 1.7 - 7.7 K/uL   Lymphocytes Relative 19 %   Lymphs Abs 1.6 0.7 - 4.0 K/uL   Monocytes Relative 9 %  Monocytes Absolute 0.8 0.1 - 1.0 K/uL   Eosinophils Relative 2 %   Eosinophils Absolute 0.1 0.0 - 0.5 K/uL   Basophils Relative 0 %   Basophils Absolute 0.0 0.0 - 0.1 K/uL   Immature Granulocytes 1 %   Abs Immature Granulocytes 0.04 0.00 - 0.07 K/uL  POC CBG, ED     Status: Abnormal   Collection Time: 10/15/17  9:37 AM  Result Value Ref Range   Glucose-Capillary 238 (H) 70 - 99 mg/dL   No results found for this or any previous visit (from the past 240 hour(s)).  Renal Function: Recent Labs    10/15/17 0630  CREATININE 0.84   Estimated Creatinine Clearance: 69.6 mL/min (by C-G formula based on SCr of 0.84 mg/dL).  Radiologic Imaging: Ct Renal Stone Study  Result Date: 10/15/2017 CLINICAL DATA:  Right flank pain EXAM: CT ABDOMEN AND PELVIS WITHOUT CONTRAST TECHNIQUE: Multidetector CT imaging of the abdomen and pelvis was performed following the standard protocol without IV contrast. COMPARISON:  December 30, 2016 CT abdomen and pelvis; abdominal MRI May 29, 2016 FINDINGS: Lower chest: There is mild scarring in the lung bases. There are foci of coronary artery calcification. There are mildly prominent vascular structures in the periesophageal region, presumed varices. Hepatobiliary: Liver contour is nodular, consistent with underlying hepatic cirrhosis. There is a stable wedge-shaped area of decreased attenuation along the periphery of the liver measuring 2.4 x 1.9 cm. No new liver lesions are evident on this noncontrast enhanced study. Gallbladder wall is not appreciably thickened. There is no biliary duct dilatation. Pancreas: There is no pancreatic mass or inflammatory focus. Spleen: No splenic lesions are appreciable on this noncontrast enhanced study. Spleen measures 13.3 x 12.6 x 7.1 cm with a measured splenic volume of 595 cubic cm. Adrenals/Urinary Tract: Adrenals bilaterally appear normal. There is a stable calcified lesion along the posterior aspect of the lower pole of the left  kidney measuring 1.4 x 1.4 cm, benign in appearance. There is a cyst arising more anteriorly from the lower pole left kidney measuring 3.4 x 3.3 cm. There is moderate hydronephrosis on the right. There is no hydronephrosis on the left. There is a calculus in a lower pole left renal calyx measuring 1.2 x 1.1 cm. A calculus more superiorly in the left kidney measures 0.9 x 0.9 cm. There is a 1 mm nearby calculus in the upper pole left kidney. There is a 2 mm calculus also in the upper pole left kidney. On the right, there is a calculus in a lower pole calyx measuring 1.6 x 1.1 cm. There is a calculus at the right ureteropelvic junction measuring 1.1 x 0.8 cm. No other ureteral calculi are evident. Urinary bladder is midline with wall thickness within normal limits. Stomach/Bowel: There is no appreciable bowel wall thickening. There is no evident bowel obstruction. There is no free air or portal venous air. Vascular/Lymphatic: There is a filter in the inferior vena cava. There is aortoiliac atherosclerosis. No evident aneurysm. Major mesenteric arterial vessels appear patent. No adenopathy is appreciable in the abdomen or pelvis. Reproductive: There are multiple prostatic calculi. Prostate is upper normal in size. Seminal vesicles appear normal. No well-defined pelvic mass. Other: Appendix absent. There is mesh in the lower abdominal region anteriorly consistent with previous hernia repair. No hernia currently apparent. There is thinning of the lateral rectus muscle on the left with fatty infiltration in this area, stable. There is no abscess or ascites in the abdomen or pelvis. There is  stranding in the mesentery in the left lower quadrant which does not appear associated with bowel. There is no abnormal fluid in this area. Musculoskeletal: There is evidence of previous collapse of the L1 vertebral body with mild retropulsion of bone posteriorly, stable. There is multilevel arthropathy in the lower thoracic and lumbar  spine regions. There is mild spinal stenosis at the L1 level due to the retropulsion from previous fracture. There is severe spinal stenosis at L3-4 and L4-5 due to diffuse disc protrusion and bony hypertrophy. Moderate stenosis due to similar factors is noted at L2-3. Bones are osteoporotic. No blastic or lytic bone lesions. There are no evident intramuscular lesions. IMPRESSION: 1. Moderate hydronephrosis on the right. There is a calculus at the right ureteropelvic junction measuring 1.1 x 0.8 cm causing obstruction. 2. Multiple intrarenal calculi, more on the left than on the right. Several prostatic calculi also noted. 3. Hepatic cirrhosis, unchanged in appearance. Stable wedge-shaped lesion in the posterior segment right lobe of the liver, benign in appearance. 4. Somewhat nonspecific appearing mesenteric thickening in the left lower quadrant which appear separate from bowel. 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 There is no adenopathy or fluid in this area. 5. Severe spinal stenosis at L3-4 and L4-5 due to bony hypertrophy and diffuse disc protrusion. Moderate stenosis due to similar factors noted at L2-3. Bones osteoporotic. Extensive multilevel arthropathy. Stable compression fracture at L1 with mild retropulsion of bone into the canal causing mild spinal stenosis at this level. 6.  Filter in the inferior vena cava. 7.  Splenic prominence.  There are periesophageal varices. 8.  Aortoiliac atherosclerosis. Aortic Atherosclerosis (ICD10-I70.0). Electronically Signed   By: Bretta Bang III M.D.   On: 10/15/2017 07:30    Impression/Assessment:  Large right proximal ureteral stone  Plan:  In the absence of adequate pain control, I have recommended proceeding with cystoscopy and right ureteral stent placement.  Will admit him for IVF hydration and pain/nausea control with plans to proceed later this afternoon.  INR will be checked but ok to proceed unless he is supratherapeutic. I discussed the  potential benefits and risks of the procedure, side effects of the proposed treatment, the likelihood of the patient achieving the goals of the procedure, and any potential problems that might occur during the procedure or recuperation.  He will ultimately need outpatient follow up with Dr. Annabell Howells for definitive treatment of his stone.  Cambree Hendrix,LES 10/15/2017, 11:01 AM  Moody Bruins MD

## 2017-10-15 NOTE — Anesthesia Preprocedure Evaluation (Addendum)
Anesthesia Evaluation  Patient identified by MRN, date of birth, ID band Patient awake    Reviewed: Allergy & Precautions, NPO status , Patient's Chart, lab work & pertinent test results  Airway Mallampati: II  TM Distance: >3 FB Neck ROM: Full    Dental no notable dental hx. (+) Chipped, Poor Dentition   Pulmonary PE   Pulmonary exam normal breath sounds clear to auscultation       Cardiovascular hypertension, Pt. on medications + DVT  Normal cardiovascular exam+ dysrhythmias Atrial Fibrillation  Rhythm:Regular Rate:Normal     Neuro/Psych CVA, Residual Symptoms negative psych ROS   GI/Hepatic negative GI ROS, (+) Cirrhosis       ,   Endo/Other  diabetes, Type 2  Renal/GU Renal disease  negative genitourinary   Musculoskeletal negative musculoskeletal ROS (+)   Abdominal   Peds negative pediatric ROS (+)  Hematology negative hematology ROS (+)   Anesthesia Other Findings   Reproductive/Obstetrics negative OB ROS                             Anesthesia Physical Anesthesia Plan  ASA: III  Anesthesia Plan: General   Post-op Pain Management:    Induction: Intravenous  PONV Risk Score and Plan: 2 and Ondansetron and Treatment may vary due to age or medical condition  Airway Management Planned: LMA  Additional Equipment:   Intra-op Plan:   Post-operative Plan:   Informed Consent: I have reviewed the patients History and Physical, chart, labs and discussed the procedure including the risks, benefits and alternatives for the proposed anesthesia with the patient or authorized representative who has indicated his/her understanding and acceptance.   Dental advisory given  Plan Discussed with: CRNA  Anesthesia Plan Comments:         Anesthesia Quick Evaluation

## 2017-10-15 NOTE — ED Triage Notes (Signed)
Patient is complaining of right flank pain. Patient states he has been having a kidney stone there. He thinks that he has another one.

## 2017-10-15 NOTE — ED Notes (Signed)
Requested patient to urinate. 

## 2017-10-15 NOTE — Anesthesia Postprocedure Evaluation (Signed)
Anesthesia Post Note  Patient: [name]  Procedure(s) Performed: CYSTOSCOPY WITH STENT PLACEMENT (Right Ureter)     Patient location during evaluation: PACU Anesthesia Type: General Level of consciousness: awake and alert Pain management: pain level controlled Vital Signs Assessment: post-procedure vital signs reviewed and stable Respiratory status: spontaneous breathing, nonlabored ventilation, respiratory function stable and patient connected to nasal cannula oxygen Cardiovascular status: blood pressure returned to baseline and stable Postop Assessment: no apparent nausea or vomiting Anesthetic complications: no    Last Vitals:  Vitals:   10/15/17 1632 10/15/17 1645  BP: 137/77 133/83  Pulse: 91 86  Resp: 15 14  Temp: (!) 36.3 C (!) 36.3 C  SpO2: 100% 98%    Last Pain:  Vitals:   10/15/17 1645  TempSrc:   PainSc: Asleep                 Phillips Grout

## 2017-10-15 NOTE — Anesthesia Procedure Notes (Signed)
Procedure Name: LMA Insertion Date/Time: 10/15/2017 3:58 PM Performed by: Thornell Mule, CRNA Pre-anesthesia Checklist: Patient identified, Emergency Drugs available, Suction available and Patient being monitored Patient Re-evaluated:Patient Re-evaluated prior to induction Oxygen Delivery Method: Circle system utilized Preoxygenation: Pre-oxygenation with 100% oxygen Induction Type: IV induction LMA: LMA inserted LMA Size: 4.0 Number of attempts: 1 Placement Confirmation: positive ETCO2 Tube secured with: Tape Dental Injury: Teeth and Oropharynx as per pre-operative assessment

## 2017-10-15 NOTE — Progress Notes (Addendum)
Pt voided 300 ml amber/brownish urine serosanguinous in appearance; no large amount of frank blood apparent. addn 

## 2017-10-15 NOTE — Discharge Instructions (Signed)

## 2017-10-15 NOTE — Op Note (Signed)
Preoperative diagnosis:  1. Right ureteral calculus   Postoperative diagnosis:  1. Right ureteral calculus   Procedure:  1. Cystoscopy 2. Right ureteral stent placement (6 x24 with no string)  Surgeon: Rolly Salter, Montez Hageman. M.D.  Anesthesia: General  Complications: None  EBL: Minimal  Specimens: None  Indication: [name] is a 77 y.o. patient with a right ureteral stone with uncontrolled pain. After reviewing the management options for treatment, he elected to proceed with the above surgical procedure(s). We have discussed the potential benefits and risks of the procedure, side effects of the proposed treatment, the likelihood of the patient achieving the goals of the procedure, and any potential problems that might occur during the procedure or recuperation. Informed consent has been obtained.  Description of procedure:  The patient was taken to the operating room and general anesthesia was induced.  The patient was placed in the dorsal lithotomy position, prepped and draped in the usual sterile fashion, and preoperative antibiotics were administered. A preoperative time-out was performed.   Cystourethroscopy was performed.  The patient's urethra was examined and was normal. The bladder was then systematically examined in its entirety. There was no evidence for any bladder tumors, stones, or other mucosal pathology.    Attention then turned to the right ureteral orifice and a ureteral catheter was used to intubate the ureteral orifice.  A 0.38 sensor guidewire was then advanced up the right ureter into the renal pelvis under fluoroscopic guidance.  The wire was then backloaded through the cystoscope and a ureteral stent was advance over the wire using Seldinger technique.  The stent was positioned appropriately under fluoroscopic and cystoscopic guidance.  The wire was then removed with an adequate stent curl noted in the renal pelvis as well as in the bladder.  The bladder was  then emptied and the procedure ended.  The patient appeared to tolerate the procedure well and without complications.  The patient was able to be awakened and transferred to the recovery unit in satisfactory condition.    Moody Bruins MD

## 2017-10-16 ENCOUNTER — Encounter (HOSPITAL_COMMUNITY): Payer: Self-pay | Admitting: Urology

## 2017-10-16 LAB — GLUCOSE, CAPILLARY
Glucose-Capillary: 150 mg/dL — ABNORMAL HIGH (ref 70–99)
Glucose-Capillary: 183 mg/dL — ABNORMAL HIGH (ref 70–99)
Glucose-Capillary: 249 mg/dL — ABNORMAL HIGH (ref 70–99)

## 2017-10-16 LAB — URINE CULTURE

## 2017-10-16 LAB — PROTIME-INR
INR: 2.68
Prothrombin Time: 28.3 seconds — ABNORMAL HIGH (ref 11.4–15.2)

## 2017-10-16 NOTE — Discharge Summary (Signed)
  Date of admission: 10/15/2017  Date of discharge: 10/16/2017  Admission diagnosis: Right UPJ calculus  Discharge diagnosis: Right UPJ calculus  Secondary diagnoses: History of DVT/PE, hypertension, diabetes  History and Physical: For full details, please see admission history and physical. Briefly, [name] is a 77 y.o. year old patient with a large right UPJ calculus and uncontrolled pain.   Hospital Course: He underwent cystoscopy and right ureteral stent placement for pain control.  He did not have a ride home and was observed overnight and was feeling improved and able to be discharged on POD # 1.    Laboratory values:  Recent Labs    10/15/17 0630  HGB 14.0  HCT 42.1   Recent Labs    10/15/17 0630  CREATININE 0.84    Disposition: Home  Discharge instruction: The patient was instructed to call for fever > 101 or uncontrolled pain.  Discharge medications:  Allergies as of 10/16/2017      Reactions   Prednisone    Other reaction(s): Other (See Comments) unknown   Zithromax [azithromycin] Other (See Comments)   Weak and flulike symptoms      Medication List    TAKE these medications   glimepiride 4 MG tablet Commonly known as:  AMARYL Take 4 mg by mouth every morning.   HYDROcodone-acetaminophen 5-325 MG tablet Commonly known as:  NORCO/VICODIN Take 1-2 tablets by mouth every 6 (six) hours as needed.   losartan 100 MG tablet Commonly known as:  COZAAR Take 100 mg by mouth every morning.   metFORMIN 500 MG tablet Commonly known as:  GLUCOPHAGE Take 1,000 mg by mouth 2 (two) times daily.   ondansetron 4 MG tablet Commonly known as:  ZOFRAN Take 1 tablet (4 mg total) by mouth every 8 (eight) hours as needed for nausea or vomiting.   simvastatin 40 MG tablet Commonly known as:  ZOCOR Take 40 mg by mouth every evening.   warfarin 2 MG tablet Commonly known as:  COUMADIN Take 2 mg by mouth daily. 3 MG on three days and 2 MG on the other 4 days       Followup:  Follow-up Information    Bjorn Pippin, MD.   Specialty:  Urology Why:  Office will call to schedule Contact information: [address] [place] [address] [phone]

## 2017-10-18 ENCOUNTER — Telehealth: Payer: Self-pay | Admitting: Internal Medicine

## 2017-10-18 NOTE — Telephone Encounter (Signed)
° °  [facility] Pre-operative Risk Assessment    Request for surgical clearance:  1. What type of surgery is being performed?  Removal of Kidney Stone   2. When is this surgery scheduled? Pending   3. What type of clearance is required (medical clearance vs. Pharmacy clearance to hold med vs. Both)? Pharmacy  4. Are there any medications that need to be held prior to surgery and how long? Warfin- Can pt hold Warfarin? If so for how long?  a 5. Practice name and name of physician performing surgery? Dr Irine Seal   6. What is your office phone number [phone]    7.   What is your office fax number [phone]  8.   Anesthesia type (None, local, MAC, general) ? General    [name] 10/18/2017, 12:53 PM  _________________________________________________________________   (provider comments below)

## 2017-10-20 ENCOUNTER — Other Ambulatory Visit: Payer: Self-pay | Admitting: Urology

## 2017-10-25 NOTE — Telephone Encounter (Signed)
Spoke with Pam @ Dr. Aaron Edelman office and informed her that we don't manage pts Coumadin, therefore, we cannot advise on the surgical clearance. Pam thanked me for the call.

## 2017-10-25 NOTE — Telephone Encounter (Signed)
   Primary Cardiologist: No primary care provider on file.  Chart reviewed as part of pre-operative protocol coverage. Callback staff, please call requesting surgeon's office. Patient is not followed by our office and we do not manage Coumadin, therefore we cannot provide commentary on clearance. If cardiology clearance is needed, appointment will need to be made. Will remove this message from pre-op pool.  Laurann Montana, PA-C 10/25/2017, 2:49 PM

## 2017-10-26 ENCOUNTER — Emergency Department (HOSPITAL_COMMUNITY): Payer: Medicare Other

## 2017-10-26 ENCOUNTER — Other Ambulatory Visit: Payer: Self-pay

## 2017-10-26 ENCOUNTER — Inpatient Hospital Stay (HOSPITAL_COMMUNITY)
Admission: EM | Admit: 2017-10-26 | Discharge: 2017-11-04 | DRG: 854 | Disposition: A | Discharge status: Skilled Nursing Facility | Payer: Medicare Other | Attending: Internal Medicine | Admitting: Internal Medicine

## 2017-10-26 ENCOUNTER — Encounter (HOSPITAL_COMMUNITY): Payer: Self-pay | Admitting: Radiology

## 2017-10-26 DIAGNOSIS — R6520 Severe sepsis without septic shock: Secondary | ICD-10-CM | POA: Diagnosis present

## 2017-10-26 DIAGNOSIS — K862 Cyst of pancreas: Secondary | ICD-10-CM | POA: Diagnosis present

## 2017-10-26 DIAGNOSIS — Z881 Allergy status to other antibiotic agents status: Secondary | ICD-10-CM

## 2017-10-26 DIAGNOSIS — R162 Hepatomegaly with splenomegaly, not elsewhere classified: Secondary | ICD-10-CM | POA: Diagnosis present

## 2017-10-26 DIAGNOSIS — G4700 Insomnia, unspecified: Secondary | ICD-10-CM | POA: Diagnosis present

## 2017-10-26 DIAGNOSIS — I10 Essential (primary) hypertension: Secondary | ICD-10-CM | POA: Diagnosis not present

## 2017-10-26 DIAGNOSIS — M4856XA Collapsed vertebra, not elsewhere classified, lumbar region, initial encounter for fracture: Secondary | ICD-10-CM | POA: Diagnosis present

## 2017-10-26 DIAGNOSIS — I509 Heart failure, unspecified: Secondary | ICD-10-CM | POA: Diagnosis present

## 2017-10-26 DIAGNOSIS — I480 Paroxysmal atrial fibrillation: Secondary | ICD-10-CM | POA: Diagnosis present

## 2017-10-26 DIAGNOSIS — A4181 Sepsis due to Enterococcus: Principal | ICD-10-CM | POA: Diagnosis present

## 2017-10-26 DIAGNOSIS — E872 Acidosis, unspecified: Secondary | ICD-10-CM | POA: Diagnosis present

## 2017-10-26 DIAGNOSIS — E1110 Type 2 diabetes mellitus with ketoacidosis without coma: Secondary | ICD-10-CM | POA: Diagnosis not present

## 2017-10-26 DIAGNOSIS — A419 Sepsis, unspecified organism: Secondary | ICD-10-CM | POA: Diagnosis present

## 2017-10-26 DIAGNOSIS — Z9049 Acquired absence of other specified parts of digestive tract: Secondary | ICD-10-CM

## 2017-10-26 DIAGNOSIS — I69312 Visuospatial deficit and spatial neglect following cerebral infarction: Secondary | ICD-10-CM | POA: Diagnosis not present

## 2017-10-26 DIAGNOSIS — I69311 Memory deficit following cerebral infarction: Secondary | ICD-10-CM | POA: Diagnosis not present

## 2017-10-26 DIAGNOSIS — R7881 Bacteremia: Secondary | ICD-10-CM | POA: Diagnosis not present

## 2017-10-26 DIAGNOSIS — E1165 Type 2 diabetes mellitus with hyperglycemia: Secondary | ICD-10-CM | POA: Diagnosis present

## 2017-10-26 DIAGNOSIS — Z86718 Personal history of other venous thrombosis and embolism: Secondary | ICD-10-CM

## 2017-10-26 DIAGNOSIS — Z86711 Personal history of pulmonary embolism: Secondary | ICD-10-CM

## 2017-10-26 DIAGNOSIS — Z960 Presence of urogenital implants: Secondary | ICD-10-CM | POA: Diagnosis not present

## 2017-10-26 DIAGNOSIS — N179 Acute kidney failure, unspecified: Secondary | ICD-10-CM | POA: Diagnosis present

## 2017-10-26 DIAGNOSIS — I110 Hypertensive heart disease with heart failure: Secondary | ICD-10-CM | POA: Diagnosis present

## 2017-10-26 DIAGNOSIS — Z888 Allergy status to other drugs, medicaments and biological substances status: Secondary | ICD-10-CM

## 2017-10-26 DIAGNOSIS — Z87442 Personal history of urinary calculi: Secondary | ICD-10-CM

## 2017-10-26 DIAGNOSIS — B952 Enterococcus as the cause of diseases classified elsewhere: Secondary | ICD-10-CM | POA: Diagnosis not present

## 2017-10-26 DIAGNOSIS — E860 Dehydration: Secondary | ICD-10-CM | POA: Diagnosis present

## 2017-10-26 DIAGNOSIS — Z95828 Presence of other vascular implants and grafts: Secondary | ICD-10-CM | POA: Diagnosis not present

## 2017-10-26 DIAGNOSIS — N201 Calculus of ureter: Secondary | ICD-10-CM | POA: Diagnosis not present

## 2017-10-26 DIAGNOSIS — K766 Portal hypertension: Secondary | ICD-10-CM | POA: Diagnosis present

## 2017-10-26 DIAGNOSIS — Z8673 Personal history of transient ischemic attack (TIA), and cerebral infarction without residual deficits: Secondary | ICD-10-CM

## 2017-10-26 DIAGNOSIS — D6859 Other primary thrombophilia: Secondary | ICD-10-CM | POA: Diagnosis present

## 2017-10-26 DIAGNOSIS — K7460 Unspecified cirrhosis of liver: Secondary | ICD-10-CM | POA: Diagnosis present

## 2017-10-26 DIAGNOSIS — Z79899 Other long term (current) drug therapy: Secondary | ICD-10-CM

## 2017-10-26 DIAGNOSIS — N10 Acute tubulo-interstitial nephritis: Secondary | ICD-10-CM | POA: Diagnosis not present

## 2017-10-26 DIAGNOSIS — E119 Type 2 diabetes mellitus without complications: Secondary | ICD-10-CM

## 2017-10-26 DIAGNOSIS — N136 Pyonephrosis: Secondary | ICD-10-CM | POA: Diagnosis present

## 2017-10-26 DIAGNOSIS — E1121 Type 2 diabetes mellitus with diabetic nephropathy: Secondary | ICD-10-CM

## 2017-10-26 DIAGNOSIS — N132 Hydronephrosis with renal and ureteral calculous obstruction: Secondary | ICD-10-CM | POA: Diagnosis not present

## 2017-10-26 DIAGNOSIS — S32000A Wedge compression fracture of unspecified lumbar vertebra, initial encounter for closed fracture: Secondary | ICD-10-CM | POA: Diagnosis present

## 2017-10-26 DIAGNOSIS — N12 Tubulo-interstitial nephritis, not specified as acute or chronic: Secondary | ICD-10-CM

## 2017-10-26 DIAGNOSIS — E785 Hyperlipidemia, unspecified: Secondary | ICD-10-CM | POA: Diagnosis present

## 2017-10-26 DIAGNOSIS — Z7984 Long term (current) use of oral hypoglycemic drugs: Secondary | ICD-10-CM

## 2017-10-26 DIAGNOSIS — I5030 Unspecified diastolic (congestive) heart failure: Secondary | ICD-10-CM | POA: Diagnosis not present

## 2017-10-26 DIAGNOSIS — N390 Urinary tract infection, site not specified: Secondary | ICD-10-CM | POA: Diagnosis not present

## 2017-10-26 DIAGNOSIS — Z7901 Long term (current) use of anticoagulants: Secondary | ICD-10-CM

## 2017-10-26 DIAGNOSIS — I4891 Unspecified atrial fibrillation: Secondary | ICD-10-CM | POA: Diagnosis present

## 2017-10-26 HISTORY — DX: Presence of urogenital implants: Z96.0

## 2017-10-26 LAB — CBC
HCT: 45.8 % (ref 39.0–52.0)
Hemoglobin: 14.9 g/dL (ref 13.0–17.0)
MCH: 30.8 pg (ref 26.0–34.0)
MCHC: 32.5 g/dL (ref 30.0–36.0)
MCV: 94.8 fL (ref 80.0–100.0)
Platelets: 155 10*3/uL (ref 150–400)
RBC: 4.83 MIL/uL (ref 4.22–5.81)
RDW: 14.2 % (ref 11.5–15.5)
WBC: 23.6 10*3/uL — ABNORMAL HIGH (ref 4.0–10.5)
nRBC: 0 % (ref 0.0–0.2)

## 2017-10-26 LAB — HEPATIC FUNCTION PANEL
ALT: 41 U/L (ref 0–44)
AST: 97 U/L — ABNORMAL HIGH (ref 15–41)
Albumin: 3.7 g/dL (ref 3.5–5.0)
Alkaline Phosphatase: 60 U/L (ref 38–126)
Bilirubin, Direct: 0.4 mg/dL — ABNORMAL HIGH (ref 0.0–0.2)
Indirect Bilirubin: 0.8 mg/dL (ref 0.3–0.9)
Total Bilirubin: 1.2 mg/dL (ref 0.3–1.2)
Total Protein: 7.2 g/dL (ref 6.5–8.1)

## 2017-10-26 LAB — BASIC METABOLIC PANEL
ANION GAP: 10 (ref 5–15)
Anion gap: 17 — ABNORMAL HIGH (ref 5–15)
BUN: 34 mg/dL — ABNORMAL HIGH (ref 8–23)
BUN: 32 mg/dL — ABNORMAL HIGH (ref 8–23)
CO2: 16 mmol/L — ABNORMAL LOW (ref 22–32)
CO2: 20 mmol/L — ABNORMAL LOW (ref 22–32)
Calcium: 8.8 mg/dL — ABNORMAL LOW (ref 8.9–10.3)
Calcium: 8.3 mg/dL — ABNORMAL LOW (ref 8.9–10.3)
Chloride: 101 mmol/L (ref 98–111)
Chloride: 105 mmol/L (ref 98–111)
Creatinine, Ser: 1.71 mg/dL — ABNORMAL HIGH (ref 0.61–1.24)
Creatinine, Ser: 1.34 mg/dL — ABNORMAL HIGH (ref 0.61–1.24)
GFR calc Af Amer: 43 mL/min — ABNORMAL LOW (ref >60)
GFR calc Af Amer: 58 mL/min — ABNORMAL LOW (ref >60)
GFR calc non Af Amer: 37 mL/min — ABNORMAL LOW (ref >60)
GFR calc non Af Amer: 50 mL/min — ABNORMAL LOW (ref >60)
GLUCOSE: 327 mg/dL — AB (ref 70–99)
Glucose, Bld: 415 mg/dL — ABNORMAL HIGH (ref 70–99)
POTASSIUM: 4.1 mmol/L (ref 3.5–5.1)
Potassium: 4.3 mmol/L (ref 3.5–5.1)
Sodium: 134 mmol/L — ABNORMAL LOW (ref 135–145)
Sodium: 135 mmol/L (ref 135–145)

## 2017-10-26 LAB — TYPE AND SCREEN
ABO/RH(D): O POS
Antibody Screen: NEGATIVE

## 2017-10-26 LAB — PROTIME-INR
INR: 3.76
Prothrombin Time: 36.6 seconds — ABNORMAL HIGH (ref 11.4–15.2)

## 2017-10-26 LAB — CBG MONITORING, ED
GLUCOSE-CAPILLARY: 324 mg/dL — AB (ref 70–99)
Glucose-Capillary: 400 mg/dL — ABNORMAL HIGH (ref 70–99)

## 2017-10-26 LAB — GLUCOSE, CAPILLARY: GLUCOSE-CAPILLARY: 236 mg/dL — AB (ref 70–99)

## 2017-10-26 LAB — I-STAT CG4 LACTIC ACID, ED
Lactic Acid, Venous: 7.15 mmol/L (ref 0.5–1.9)
Lactic Acid, Venous: 6.73 mmol/L (ref 0.5–1.9)

## 2017-10-26 LAB — URINALYSIS, ROUTINE W REFLEX MICROSCOPIC
Bilirubin Urine: NEGATIVE
Glucose, UA: >=500 mg/dL — AB
Ketones, ur: 5 mg/dL — AB
Nitrite: NEGATIVE
Protein, ur: 100 mg/dL — AB
RBC / HPF: >50 RBC/hpf — ABNORMAL HIGH (ref 0–5)
Specific Gravity, Urine: 1.02 (ref 1.005–1.030)
pH: 6 (ref 5.0–8.0)

## 2017-10-26 LAB — MRSA PCR SCREENING: MRSA BY PCR: NEGATIVE

## 2017-10-26 LAB — MAGNESIUM: Magnesium: 1.8 mg/dL (ref 1.7–2.4)

## 2017-10-26 LAB — APTT: APTT: 33 s (ref 24–36)

## 2017-10-26 LAB — PROCALCITONIN: Procalcitonin: 1.61 ng/mL

## 2017-10-26 LAB — LIPASE, BLOOD: Lipase: 21 U/L (ref 11–51)

## 2017-10-26 MED ORDER — SODIUM CHLORIDE 0.9 % IV SOLN
INTRAVENOUS | Status: DC
  Administered 2017-10-26 – 2017-10-28 (×5): via INTRAVENOUS

## 2017-10-26 MED ORDER — WARFARIN SODIUM 1 MG PO TABS
1.0000 mg | ORAL_TABLET | Freq: Once | ORAL | Status: AC
  Administered 2017-10-26: 1 mg via ORAL
  Filled 2017-10-26: qty 1

## 2017-10-26 MED ORDER — LACTATED RINGERS IV BOLUS
2000.0000 mL | Freq: Once | INTRAVENOUS | Status: AC
  Administered 2017-10-26: 2000 mL via INTRAVENOUS

## 2017-10-26 MED ORDER — INSULIN ASPART 100 UNIT/ML ~~LOC~~ SOLN
0.0000 [IU] | Freq: Every day | SUBCUTANEOUS | Status: DC
  Administered 2017-10-26 – 2017-10-27 (×2): 3 [IU] via SUBCUTANEOUS

## 2017-10-26 MED ORDER — SODIUM CHLORIDE 0.9 % IV SOLN: 2.0000 g | Freq: Once | INTRAVENOUS | Status: DC

## 2017-10-26 MED ORDER — ONDANSETRON HCL 4 MG PO TABS: 4.0000 mg | ORAL_TABLET | Freq: Four times a day (QID) | ORAL | Status: DC | PRN

## 2017-10-26 MED ORDER — SODIUM CHLORIDE 0.9 % IV SOLN
2.0000 g | Freq: Once | INTRAVENOUS | Status: AC
  Administered 2017-10-26: 2 g via INTRAVENOUS
  Filled 2017-10-26: qty 2

## 2017-10-26 MED ORDER — VANCOMYCIN HCL 10 G IV SOLR: 1500.0000 mg | INTRAVENOUS | Status: DC

## 2017-10-26 MED ORDER — OXYCODONE-ACETAMINOPHEN 5-325 MG PO TABS
1.0000 | ORAL_TABLET | Freq: Four times a day (QID) | ORAL | Status: DC | PRN
  Administered 2017-10-29 – 2017-10-31 (×4): 1 via ORAL
  Filled 2017-10-26 (×4): qty 1

## 2017-10-26 MED ORDER — SENNOSIDES-DOCUSATE SODIUM 8.6-50 MG PO TABS: 1.0000 | ORAL_TABLET | Freq: Every evening | ORAL | Status: DC | PRN

## 2017-10-26 MED ORDER — ACETAMINOPHEN 500 MG PO TABS
1000.0000 mg | ORAL_TABLET | Freq: Once | ORAL | Status: AC
  Administered 2017-10-26: 1000 mg via ORAL
  Filled 2017-10-26: qty 2

## 2017-10-26 MED ORDER — ONDANSETRON HCL 4 MG/2ML IJ SOLN
4.0000 mg | Freq: Four times a day (QID) | INTRAMUSCULAR | Status: DC | PRN
  Administered 2017-10-26 – 2017-11-04 (×5): 4 mg via INTRAVENOUS
  Filled 2017-10-26 (×4): qty 2

## 2017-10-26 MED ORDER — OXYCODONE-ACETAMINOPHEN 5-325 MG PO TABS: 1.0000 | ORAL_TABLET | Freq: Four times a day (QID) | ORAL | Status: DC

## 2017-10-26 MED ORDER — ACETAMINOPHEN 325 MG PO TABS
650.0000 mg | ORAL_TABLET | Freq: Four times a day (QID) | ORAL | Status: DC | PRN
  Administered 2017-10-26 – 2017-10-28 (×2): 650 mg via ORAL
  Filled 2017-10-26 (×2): qty 2

## 2017-10-26 MED ORDER — ACETAMINOPHEN 650 MG RE SUPP: 650.0000 mg | Freq: Four times a day (QID) | RECTAL | Status: DC | PRN

## 2017-10-26 MED ORDER — SODIUM CHLORIDE 0.9 % IV SOLN
2.0000 g | INTRAVENOUS | Status: DC
  Filled 2017-10-26: qty 2

## 2017-10-26 MED ORDER — INSULIN ASPART 100 UNIT/ML ~~LOC~~ SOLN
0.0000 [IU] | Freq: Three times a day (TID) | SUBCUTANEOUS | Status: DC
  Administered 2017-10-26 – 2017-10-27 (×3): 11 [IU] via SUBCUTANEOUS
  Administered 2017-10-27: 5 [IU] via SUBCUTANEOUS
  Administered 2017-10-28: 3 [IU] via SUBCUTANEOUS
  Administered 2017-10-28: 5 [IU] via SUBCUTANEOUS
  Administered 2017-10-28 – 2017-10-29 (×2): 3 [IU] via SUBCUTANEOUS
  Administered 2017-10-29: 2 [IU] via SUBCUTANEOUS
  Administered 2017-10-29 – 2017-10-30 (×4): 3 [IU] via SUBCUTANEOUS
  Administered 2017-10-31: 2 [IU] via SUBCUTANEOUS
  Administered 2017-10-31 – 2017-11-01 (×3): 3 [IU] via SUBCUTANEOUS
  Administered 2017-11-02: 5 [IU] via SUBCUTANEOUS
  Administered 2017-11-02: 3 [IU] via SUBCUTANEOUS
  Administered 2017-11-03: 2 [IU] via SUBCUTANEOUS
  Administered 2017-11-03 – 2017-11-04 (×2): 3 [IU] via SUBCUTANEOUS
  Administered 2017-11-04: 2 [IU] via SUBCUTANEOUS

## 2017-10-26 MED ORDER — SORBITOL 70 % SOLN
30.0000 mL | Freq: Every day | Status: DC | PRN
  Filled 2017-10-26: qty 30

## 2017-10-26 MED ORDER — VANCOMYCIN HCL IN DEXTROSE 1-5 GM/200ML-% IV SOLN: 1000.0000 mg | Freq: Once | INTRAVENOUS | Status: DC

## 2017-10-26 MED ORDER — LACTATED RINGERS IV BOLUS
1000.0000 mL | Freq: Once | INTRAVENOUS | Status: AC
  Administered 2017-10-26: 1000 mL via INTRAVENOUS

## 2017-10-26 MED ORDER — WARFARIN - PHARMACIST DOSING INPATIENT: Freq: Every day | Status: DC

## 2017-10-26 MED ORDER — VANCOMYCIN HCL 10 G IV SOLR
1500.0000 mg | Freq: Once | INTRAVENOUS | Status: AC
  Administered 2017-10-26: 1500 mg via INTRAVENOUS
  Filled 2017-10-26: qty 1500

## 2017-10-26 MED ORDER — HYDRALAZINE HCL 20 MG/ML IJ SOLN: 10.0000 mg | Freq: Four times a day (QID) | INTRAMUSCULAR | Status: DC | PRN

## 2017-10-26 MED ORDER — SODIUM CHLORIDE 0.9% FLUSH
3.0000 mL | Freq: Two times a day (BID) | INTRAVENOUS | Status: DC
  Administered 2017-10-26 – 2017-11-03 (×9): 3 mL via INTRAVENOUS

## 2017-10-26 MED ORDER — SIMVASTATIN 40 MG PO TABS
40.0000 mg | ORAL_TABLET | Freq: Every evening | ORAL | Status: DC
  Administered 2017-10-26 – 2017-11-04 (×10): 40 mg via ORAL
  Filled 2017-10-26 (×10): qty 1

## 2017-10-26 NOTE — ED Notes (Signed)
PT CBG was 400

## 2017-10-26 NOTE — ED Notes (Signed)
Bed: YN82 Expected date:  Expected time:  Means of arrival:  Comments: EMS-weak/hyperglycemia

## 2017-10-26 NOTE — Progress Notes (Signed)
Pharmacy Antibiotic Note  [name] is a 77 y.o. male admitted on 10/26/2017 with IAI.  Pharmacy has been consulted for vancomycin and cefepime dosing.  WBC 23.6; SCr 1.71, Lactate 6.73, temp 102.8; abd CT: . Interval placement of RIGHT nephroureteral stent with decompressed collecting system. New RIGHT perinephric fat stranding, potential urinary tract infection Plan: Vancomycin 1500 mg IV x 1 dose given 10/22 at 1321 then 1500 mg IV q48 for est AUC 501.5 (SCr 1.71, IBW/TBW) Cefepime 2 gm IV in ED at 1245 then cefepime 2 gm IV q24 F/u renal function, WBC, temp,culture data Vancomycin levels as needed  Height: 5\' 4"  (162.6 cm) Weight: 169 lb 5 oz (76.8 kg) IBW/kg (Calculated) : 59.2  Temp (24hrs), Avg:101.3 F (38.5 C), Min:99.7 F (37.6 C), Max:102.8 F (39.3 C)  Recent Labs  Lab 10/26/17 1201 10/26/17 1223 10/26/17 1420  WBC 23.6*  --   --   CREATININE 1.71*  --   --   LATICACIDVEN  --  7.15* 6.73*    Estimated Creatinine Clearance: 34.4 mL/min (A) (by C-G formula based on SCr of 1.71 mg/dL (H)).    Allergies  Allergen Reactions  . Prednisone     Other reaction(s): Other (See Comments) unknown  . Zithromax [Azithromycin] Other (See Comments)    Weak and flulike symptoms    Antimicrobials this admission: 10/22 vanc>> 10/22 cefepime>> Dose adjustments this admission:  Microbiology results: 10/22 Ucx>> sent 10/22 BCx2>>sent  Thank you for allowing pharmacy to be a part of this patient's care.  Herby Abraham, Pharm.D [phone] 10/26/2017 4:46 PM

## 2017-10-26 NOTE — ED Notes (Signed)
ED TO INPATIENT HANDOFF REPORT  Name/Age/Gender [name] 77 y.o. male  Code Status Code Status History    Date Active Date Inactive Code Status Order ID Comments User Context   10/15/2017 1228 10/16/2017 1328 Full Code [id number]  [name], MD Inpatient   05/28/2016 1757 06/01/2016 1938 DNR [id number]  [name], MD Inpatient   05/28/2016 1502 05/28/2016 1757 Full Code [id number]  [name], MD ED    Advance Directive Documentation     Most Recent Value  Type of Advance Directive  Healthcare Power of Attorney, Living will  Pre-existing out of facility DNR order (yellow form or pink MOST form)  -  "MOST" Form in Place?  -      Home/SNF/Other Home  Chief Complaint weakness  Level of Care/Admitting Diagnosis ED Disposition    ED Disposition Condition [place]: [place] [100102]  Level of Care: Stepdown [14]  Admit to SDU based on following criteria: Hemodynamic compromise or significant risk of instability:  Patient requiring short term acute titration and management of vasoactive drips, and invasive monitoring (i.e., CVP and Arterial line).  Diagnosis: Sepsis [facility]) [[id number]]  Admitting Physician: Eugenie Filler Geraldine  Attending Physician: Eugenie Filler [[id number]]  Estimated length of stay: past midnight tomorrow  Certification:: I certify this patient will need inpatient services for at least 2 midnights  PT Class (Do Not Modify): Inpatient [101]  PT Acc Code (Do Not Modify): Private [1]       Medical History Past Medical History:  Diagnosis Date  . Cirrhosis ([place])    noted on CT 05/28/16  . Closed compression fracture of L1 lumbar vertebra 05/2016  . Diabetes mellitus   . Diverticulitis   . DVT (deep venous thrombosis) ([place]) 2002  . Dyspnea    with exertion  . History of kidney stones   . Hyperlipidemia   . Hypertension   . Insomnia   . Kidney stones    hx of  . Liver mass, right lobe    noted on CT 05/28/16  . Pancreatic cyst    noted on CT 05/28/16  . Perforated diverticulum   . Presence of inferior vena cava filter 2002  . Pulmonary embolism ([place]) 2002  . Secondary hypercoagulability disorder ([place])   . Status post placement of ureteral stent:R 10/15/2017 10/26/2017  . Stroke [facility]) 2002   right eye no peripheral vision, short term memory loss    Allergies Allergies  Allergen Reactions  . Prednisone     Other reaction(s): Other (See Comments) unknown  . Zithromax [Azithromycin] Other (See Comments)    Weak and flulike symptoms    IV Location/Drains/Wounds Patient Lines/Drains/Airways Status   Active Line/Drains/Airways    Name:   Placement date:   Placement time:   Site:   Days:   Peripheral IV 10/26/17 Left Hand   10/26/17    1140    Hand   less than 1   Peripheral IV 10/26/17 Right Antecubital   10/26/17    1239    Antecubital   less than 1   Ureteral Drain/Stent Right ureter 6 Fr.   10/15/17    1611    Right ureter   11   Incision (Closed) 01/26/17 Penis   01/26/17    0949     273          Labs/Imaging Results for orders placed or performed during the hospital encounter of 10/26/17 (from the  past 48 hour(s))  CBG monitoring, ED     Status: Abnormal   Collection Time: 10/26/17 11:39 AM  Result Value Ref Range   Glucose-Capillary 400 (H) 70 - 99 mg/dL  Basic metabolic panel     Status: Abnormal   Collection Time: 10/26/17 12:01 PM  Result Value Ref Range   Sodium 134 (L) 135 - 145 mmol/L   Potassium 4.3 3.5 - 5.1 mmol/L   Chloride 101 98 - 111 mmol/L   CO2 16 (L) 22 - 32 mmol/L   Glucose, Bld 415 (H) 70 - 99 mg/dL   BUN 34 (H) 8 - 23 mg/dL   Creatinine, Ser 1.71 (H) 0.61 - 1.24 mg/dL   Calcium 8.8 (L) 8.9 - 10.3 mg/dL   GFR calc non Af Amer 37 (L) >60 mL/min   GFR calc Af Amer 43 (L) >60 mL/min    Comment: (NOTE) The eGFR has been calculated using the CKD EPI equation. This calculation has not been validated in all clinical situations. eGFR's  persistently <60 mL/min signify possible Chronic Kidney Disease.    Anion gap 17 (H) 5 - 15    Comment: Performed at [facility], [place] [address], [place], [place] [address]  CBC     Status: Abnormal   Collection Time: 10/26/17 12:01 PM  Result Value Ref Range   WBC 23.6 (H) 4.0 - 10.5 K/uL   RBC 4.83 4.22 - 5.81 MIL/uL   Hemoglobin 14.9 13.0 - 17.0 g/dL   HCT 45.8 39.0 - 52.0 %   MCV 94.8 80.0 - 100.0 fL   MCH 30.8 26.0 - 34.0 pg   MCHC 32.5 30.0 - 36.0 g/dL   RDW 14.2 11.5 - 15.5 %   Platelets 155 150 - 400 K/uL   nRBC 0.0 0.0 - 0.2 %    Comment: Performed at [facility], [place] [address], [place], [place] [address]  Hepatic function panel     Status: Abnormal   Collection Time: 10/26/17 12:01 PM  Result Value Ref Range   Total Protein 7.2 6.5 - 8.1 g/dL   Albumin 3.7 3.5 - 5.0 g/dL   AST 97 (H) 15 - 41 U/L   ALT 41 0 - 44 U/L   Alkaline Phosphatase 60 38 - 126 U/L   Total Bilirubin 1.2 0.3 - 1.2 mg/dL   Bilirubin, Direct 0.4 (H) 0.0 - 0.2 mg/dL   Indirect Bilirubin 0.8 0.3 - 0.9 mg/dL    Comment: Performed at [facility], [place] [address], [place], [place] [address]  Lipase, blood     Status: None   Collection Time: 10/26/17 12:01 PM  Result Value Ref Range   Lipase 21 11 - 51 U/L    Comment: Performed at [facility], [place] [address], [place], [place] [address]  I-Stat CG4 Lactic Acid, ED     Status: Abnormal   Collection Time: 10/26/17 12:23 PM  Result Value Ref Range   Lactic Acid, Venous 7.15 (HH) 0.5 - 1.9 mmol/L   Comment NOTIFIED PHYSICIAN   Protime-INR     Status: Abnormal   Collection Time: 10/26/17 12:45 PM  Result Value Ref Range   Prothrombin Time 36.6 (H) 11.4 - 15.2 seconds   INR 3.76     Comment: Performed at [facility], [place] [address] [place], [place] [address]  Urinalysis, Routine w reflex microscopic     Status: Abnormal   Collection Time: 10/26/17 12:59  PM  Result  Value Ref Range   Color, Urine AMBER (A) YELLOW    Comment: BIOCHEMICALS MAY BE AFFECTED BY COLOR   APPearance CLOUDY (A) CLEAR   Specific Gravity, Urine 1.020 1.005 - 1.030   pH 6.0 5.0 - 8.0   Glucose, UA >=500 (A) NEGATIVE mg/dL   Hgb urine dipstick MODERATE (A) NEGATIVE   Bilirubin Urine NEGATIVE NEGATIVE   Ketones, ur 5 (A) NEGATIVE mg/dL   Protein, ur 100 (A) NEGATIVE mg/dL   Nitrite NEGATIVE NEGATIVE   Leukocytes, UA SMALL (A) NEGATIVE   RBC / HPF >50 (H) 0 - 5 RBC/hpf   WBC, UA 21-50 0 - 5 WBC/hpf   Bacteria, UA FEW (A) NONE SEEN    Comment: Performed at [facility], [place] [address], [place] [employer] [place], [place] [address]  I-Stat CG4 Lactic Acid, ED     Status: Abnormal   Collection Time: 10/26/17  2:20 PM  Result Value Ref Range   Lactic Acid, Venous 6.73 (HH) 0.5 - 1.9 mmol/L   Comment NOTIFIED PHYSICIAN   CBG monitoring, ED     Status: Abnormal   Collection Time: 10/26/17  2:41 PM  Result Value Ref Range   Glucose-Capillary 324 (H) 70 - 99 mg/dL   Ct Abdomen Pelvis Wo Contrast  Result Date: 10/26/2017 CLINICAL DATA:  Found down. On pain medication for kidney stones. Recent ureteral stent. Fever and hyperglycemia. History of diabetes, RIGHT stent placement October 15, 2017, kidney stones, pancreatic cyst, liver mass, cirrhosis, diverticulitis. EXAM: CT ABDOMEN AND PELVIS WITHOUT CONTRAST TECHNIQUE: Multidetector CT imaging of the abdomen and pelvis was performed following the standard protocol without IV contrast. COMPARISON:  CT abdomen and pelvis October 15, 2017 and MRI abdomen May 29, 2016 FINDINGS: LOWER CHEST: Lung bases are clear. The visualized heart size is normal. Advanced coronary artery calcifications, incompletely evaluated. No pericardial effusion. HEPATOBILIARY: Nodular cirrhotic liver with enlarged LEFT lobe and caudate lobes. Subtle hypodense mass RIGHT lobe of the liver less conspicuous than prior imaging, likely technical. Negative  non-contrast CT gallbladder. PANCREAS: Nonacute. 15 mm complex mass tail of pancreas, previously evaluated with MRI. Punctate calcifications and atrophy. SPLEEN: Mild splenomegaly. ADRENALS/URINARY TRACT: Kidneys are orthotopic, demonstrating normal size. New RIGHT nephroureteral stent with proximal retaining loop within renal pelvis. Increasing RIGHT perinephric fat stranding. 8 mm calculus renal pelvis remains. No hydronephrosis. 11 mm RIGHT lower pole nephrolithiasis. 12 mm LEFT lower pole nephrolithiasis, additional smaller LEFT nephrolithiasis. Stable exophytic 12 mm calcified LEFT renal mass. 3.4 cm cyst lower pole LEFT kidney. Limited assessment for renal masses by nonenhanced CT. Urinary bladder is well distended containing distal retaining loop. Normal adrenal glands. STOMACH/BOWEL: The stomach, small and large bowel are normal in course and caliber without inflammatory changes, sensitivity decreased by lack of enteric contrast. Small volume retained large bowel stool. A few scattered colonic diverticula noted. VASCULAR/LYMPHATIC: Aortoiliac vessels are normal in course and caliber. Mild calcific atherosclerosis. Inferior vena cava filter below the level the renal veins. Paraesophageal varicosities. No lymphadenopathy by CT size criteria. REPRODUCTIVE: Mild prostatomegaly. OTHER: No intraperitoneal free fluid or free air. Status post anterior abdominal wall herniorrhaphy. MUSCULOSKELETAL: Non-acute. Linear sclerosis RIGHT femoral head equivocal for avascular necrosis without collapse. Osteopenia. Old moderate to severe L1 compression fracture and mild L5 compression fracture. Scattered old Schmorl's nodes. IMPRESSION: 1. Interval placement of RIGHT nephroureteral stent with decompressed collecting system. New RIGHT perinephric fat stranding, potential urinary tract infection. 2. Bilateral nonobstructing nephrolithiasis. 3. Cirrhosis and sequelae of portal hypertension without ascites. 4. Stable  15 mm  pancreatic cystic mass. Aortic Atherosclerosis (ICD10-I70.0). Electronically Signed   By: Elon Alas M.D.   On: 10/26/2017 14:29   Dg Chest Portable 1 View  Result Date: 10/26/2017 CLINICAL DATA:  Weakness, rapid heart rate EXAM: PORTABLE CHEST 1 VIEW COMPARISON:  Chest x-ray of 05/28/2016 FINDINGS: No active infiltrate or effusion is seen. Mediastinal and hilar contours are unremarkable. The heart is mildly enlarged and stable. No acute bony abnormality is seen. IMPRESSION: Stable mild cardiomegaly.  No active lung disease. Electronically Signed   By: Ivar Drape M.D.   On: 10/26/2017 13:12   None  Pending Labs Unresulted Labs (From admission, onward)    Start     Ordered   10/27/17 0500  Procalcitonin  Daily,   R     10/26/17 1634   10/27/17 0500  Lactic acid, plasma  Tomorrow morning,   STAT     10/26/17 1637   10/26/17 1636  APTT  STAT,   R     10/26/17 1637   10/26/17 1636  Type and screen [place]  STAT,   R    Comments:  [place]    10/26/17 1637   10/26/17 1635  Magnesium  Once,   R     10/26/17 1634   10/26/17 1635  Procalcitonin - Baseline  STAT,   STAT     10/26/17 1634   10/26/17 4193  Basic metabolic panel  Once,   R     10/26/17 1634   10/26/17 1609  Hemoglobin A1c  Once,   R    Comments:  To assess prior glycemic control    10/26/17 1608   10/26/17 1508  Urine culture  STAT,   STAT     10/26/17 1507   10/26/17 1216  Blood culture (routine x 2)  BLOOD CULTURE X 2,   STAT     10/26/17 1216   Signed and Held  Basic metabolic panel  Tomorrow morning,   R     Signed and Held   Signed and Held  CBC  Tomorrow morning,   R     Signed and Held   Signed and Held  Protime-INR  Tomorrow morning,   R     Signed and Held          Vitals/Pain Today's Vitals   10/26/17 1402 10/26/17 1430 10/26/17 1500 10/26/17 1600  BP: (!) 154/81 (!) 159/79 (!) 161/73 139/87  Pulse: (!) 110 (!) 115 (!) 115 (!) 109  Resp: (!) 33 (!)  27 (!) 38 (!) 32  Temp:      TempSrc:      SpO2: 96% 93% 93% 96%  Weight:      Height:      PainSc:        Isolation Precautions No active isolations  Medications Medications  0.9 %  sodium chloride infusion (has no administration in time range)  insulin aspart (novoLOG) injection 0-15 Units (has no administration in time range)  insulin aspart (novoLOG) injection 0-5 Units (has no administration in time range)  ceFEPIme (MAXIPIME) 2 g in sodium chloride 0.9 % 100 mL IVPB (0 g Intravenous Stopped 10/26/17 1316)  vancomycin (VANCOCIN) 1,500 mg in sodium chloride 0.9 % 500 mL IVPB ( Intravenous Stopped 10/26/17 1607)  lactated ringers bolus 2,000 mL (0 mLs Intravenous Stopped 10/26/17 1443)  acetaminophen (TYLENOL) tablet 1,000 mg (1,000 mg Oral Given 10/26/17 1402)  lactated ringers bolus 1,000 mL (0 mLs  Intravenous Stopped 10/26/17 1617)    Mobility Walks normally but needs assistance at this time for weakness ]

## 2017-10-26 NOTE — ED Notes (Signed)
Ed provider [place] notified patient has critical lactic acid value of 6.73

## 2017-10-26 NOTE — ED Triage Notes (Signed)
Patient arrives with c/o weakness. Family was concerned patient took too many pain medications. EMS found patient on floor. Patient states increased weakness since hes been on pain medications for kidney stones. Patient stated he is taking more than prescribed. BGL 357. Hx diabetes.  BP: 130/88 HR: 90 CBG: 357 20gIV left hand, bolus given.

## 2017-10-26 NOTE — ED Provider Notes (Signed)
[facility]-EMERGENCY DEPT Provider Note   CSN: [id number] Arrival date & time: 10/26/17  1122     History   Chief Complaint Chief Complaint  Patient presents with  . Fatigue    HPI [name] is a 77 y.o. male.  HPI  77 year old male with generalized weakness.  Recent history significant for right UPJ calculus with cystoscopy and right ureteral stent placement on 10/15/17.  He reports that he was initially feeling better but then for the past week or so he has had progressive generalized weakness.  Nausea was well and vomited once.  Worsening right flank/right thoracic back pain.  Subjective fevers.  Feels like he just has no energy.  He had a fall 2 days ago and laid on the floor for 5 hours because he just did not have the energy to get himself up.  He denies any acute injuries as a result of this fall though.  Past Medical History:  Diagnosis Date  . Cirrhosis ([facility])    noted on CT 05/28/16  . Closed compression fracture of L1 lumbar vertebra 05/2016  . Diabetes mellitus   . Diverticulitis   . DVT (deep venous thrombosis) ([facility]) 2002  . Dyspnea    with exertion  . History of kidney stones   . Hyperlipidemia   . Hypertension   . Insomnia   . Kidney stones    hx of  . Liver mass, right lobe    noted on CT 05/28/16  . Pancreatic cyst    noted on CT 05/28/16  . Perforated diverticulum   . Presence of inferior vena cava filter 2002  . Pulmonary embolism ([facility]) 2002  . Secondary hypercoagulability disorder ([facility])   . Stroke [facility]) 2002   right eye no peripheral vision, short term memory loss    Patient Active Problem List   Diagnosis Date Noted  . Right ureteral stone 10/15/2017  . Cirrhosis of liver ([facility]) 06/07/2016  . Pancreatic cyst 06/07/2016  . AF (atrial fibrillation) ([facility]) 06/07/2016  . Hyperlipidemia 06/07/2016  . History of stroke 06/07/2016  . Thrombocytopenia ([facility]) 06/07/2016  . Closed compression fracture of L1 lumbar vertebra  05/28/2016  . HTN (hypertension) 05/28/2016  . DM II (diabetes mellitus, type II), controlled ([facility]) 05/28/2016  . Closed compression fracture of body of lumbar vertebra ([facility]) 05/28/2016  . Incisional hernia x 2.  Repaired 9/60/2013. 08/21/2011  . Presence of inferior vena cava filter 01/06/2000    Past Surgical History:  Procedure Laterality Date  . APPENDECTOMY  1949  . BACK SURGERY    . CARPAL TUNNEL RELEASE  2002 both wrists  . COLON SURGERY  07/19/2009   sigmoid colectomy   . CYSTOSCOPY WITH BIOPSY N/A 01/26/2017   Procedure: CYSTOSCOPY;  Surgeon: Bjorn Pippin, MD;  Location: [facility];  Service: Urology;  Laterality: N/A;  . CYSTOSCOPY WITH STENT PLACEMENT Right 10/15/2017   Procedure: CYSTOSCOPY WITH STENT PLACEMENT;  Surgeon: Heloise Purpura, MD;  Location: [facility];  Service: Urology;  Laterality: Right;  . HERNIA REPAIR   1983, 1983, 02/03/2010   lap ventral hernia repair  . KIDNEY STONE SURGERY  1977, 1983  . LITHOTRIPSY  2010   bleeding in kidney after  . surgery for diverticulosis  2011  . TONSILLECTOMY  1947  . TONSILLECTOMY AND ADENOIDECTOMY    . VENTRAL HERNIA REPAIR  09/11/2011   Procedure: LAPAROSCOPIC VENTRAL HERNIA;  Surgeon: Kandis Cocking, MD;  Location: [facility];  Service: General;  Laterality:  N/A;  Lapaaroscopic Repair Ventral Incisional Hernias x 2        Home Medications    Prior to Admission medications   Medication Sig Start Date End Date Taking? Authorizing Provider  glimepiride (AMARYL) 4 MG tablet Take 4 mg by mouth every morning.  03/10/10  Yes [provider]  glipiZIDE (GLUCOTROL XL) 2.5 MG 24 hr tablet Take 2.5 mg by mouth daily. 10/18/17  Yes [provider]  HYDROcodone-acetaminophen (NORCO/VICODIN) 5-325 MG tablet Take 1-2 tablets by mouth every 6 (six) hours as needed. 10/15/17  Yes Heloise Purpura, MD  losartan (COZAAR) 100 MG tablet Take 100 mg by mouth every morning.    Yes [provider]  metFORMIN  (GLUCOPHAGE-XR) 500 MG 24 hr tablet Take 1,000 mg by mouth 2 (two) times daily. 10/16/17  Yes [provider]  ondansetron (ZOFRAN) 4 MG tablet Take 1 tablet (4 mg total) by mouth every 8 (eight) hours as needed for nausea or vomiting. 10/15/17  Yes Heloise Purpura, MD  oxyCODONE-acetaminophen (PERCOCET/ROXICET) 5-325 MG tablet Take 1 tablet by mouth every 6 (six) hours. 10/19/17  Yes [provider]  simvastatin (ZOCOR) 40 MG tablet Take 40 mg by mouth every evening.    Yes [provider]  warfarin (COUMADIN) 2 MG tablet Take 2 mg by mouth daily. 3 MG on three days and 2 MG on the other 4 days   Yes [provider]    Family History Family History  Problem Relation Age of Onset  . Cancer Neg Hx     Social History Social History   Tobacco Use  . Smoking status: Never Smoker  . Smokeless tobacco: Never Used  Substance Use Topics  . Alcohol use: No  . Drug use: No     Allergies   Prednisone and Zithromax [azithromycin]   Review of Systems Review of Systems  All systems reviewed and negative, other than as noted in HPI.  Physical Exam Updated Vital Signs BP (!) 153/73   Pulse (!) 118   Temp (!) 102.8 F (39.3 C) (Rectal)   Resp (!) 34   Ht 5\' 4"  (1.626 m)   Wt 76.8 kg   SpO2 94%   BMI 29.06 kg/m   Physical Exam  Constitutional: He appears well-developed and well-nourished. No distress.  HENT:  Head: Normocephalic and atraumatic.  Eyes: Conjunctivae are normal. Right eye exhibits no discharge. Left eye exhibits no discharge.  Neck: Neck supple.  Cardiovascular: Regular rhythm and normal heart sounds. Exam reveals no gallop and no friction rub.  No murmur heard. Tachycardic  Pulmonary/Chest: Effort normal and breath sounds normal. No respiratory distress.  Abdominal: Soft. He exhibits no distension. There is tenderness.  Right abdominal/right flank tenderness.  Right CVA tenderness.  Musculoskeletal: He exhibits no edema or  tenderness.  Neurological: He is alert.  Skin: Skin is warm and dry.  Psychiatric: He has a normal mood and affect. His behavior is normal. Thought content normal.  Nursing note and vitals reviewed.    ED Treatments / Results  Labs (all labs ordered are listed, but only abnormal results are displayed) Labs Reviewed  BASIC METABOLIC PANEL - Abnormal; Notable for the following components:      Result Value   Sodium 134 (*)    CO2 16 (*)    Glucose, Bld 415 (*)    BUN 34 (*)    Creatinine, Ser 1.71 (*)    Calcium 8.8 (*)    GFR calc non Af [place] Dago  37 (*)    GFR calc Af Amer 43 (*)    Anion gap 17 (*)    All other components within normal limits  CBC - Abnormal; Notable for the following components:   WBC 23.6 (*)    All other components within normal limits  PROTIME-INR - Abnormal; Notable for the following components:   Prothrombin Time 36.6 (*)    All other components within normal limits  I-STAT CG4 LACTIC ACID, ED - Abnormal; Notable for the following components:   Lactic Acid, Venous 7.15 (*)    All other components within normal limits  CULTURE, BLOOD (ROUTINE X 2)  CULTURE, BLOOD (ROUTINE X 2)  URINALYSIS, ROUTINE W REFLEX MICROSCOPIC  CBG MONITORING, ED    EKG None  Radiology Dg Chest Portable 1 View  Result Date: 10/26/2017 CLINICAL DATA:  Weakness, rapid heart rate EXAM: PORTABLE CHEST 1 VIEW COMPARISON:  Chest x-ray of 05/28/2016 FINDINGS: No active infiltrate or effusion is seen. Mediastinal and hilar contours are unremarkable. The heart is mildly enlarged and stable. No acute bony abnormality is seen. IMPRESSION: Stable mild cardiomegaly.  No active lung disease. Electronically Signed   By: Dwyane Dee M.D.   On: 10/26/2017 13:12    Procedures Procedures (including critical care time)  CRITICAL CARE Performed by: Raeford Razor Total critical care time: 35 minutes Critical care time was exclusive of separately billable procedures and treating other  patients. Critical care was necessary to treat or prevent imminent or life-threatening deterioration. Critical care was time spent personally by me on the following activities: development of treatment plan with patient and/or surrogate as well as nursing, discussions with consultants, evaluation of patient's response to treatment, examination of patient, obtaining history from patient or surrogate, ordering and performing treatments and interventions, ordering and review of laboratory studies, ordering and review of radiographic studies, pulse oximetry and re-evaluation of patient's condition.   Medications Ordered in ED Medications  vancomycin (VANCOCIN) 1,500 mg in sodium chloride 0.9 % 500 mL IVPB (1,500 mg Intravenous New Bag/Given 10/26/17 1321)  acetaminophen (TYLENOL) tablet 1,000 mg (has no administration in time range)  ceFEPIme (MAXIPIME) 2 g in sodium chloride 0.9 % 100 mL IVPB (0 g Intravenous Stopped 10/26/17 1316)  lactated ringers bolus 2,000 mL (2,000 mLs Intravenous New Bag/Given 10/26/17 1247)     Initial Impression / Assessment and Plan / ED Course  I have reviewed the triage vital signs and the nursing notes.  Pertinent labs & imaging results that were available during my care of the patient were reviewed by me and considered in my medical decision making (see chart for details).  77 year old male with sepsis.  Tachycardic, febrile, significant leukocytosis and lactic acid greater than 7.  He received empiric antibiotics of cefepime and vancomycin.  IV fluid bolus.  Persistent right flank pain after recent ureteral stent for UPJ stone.  We will CT.  He is declining pain and nausea medicine currently.  Ultimately need admitted.  His blood pressure has been stable.  Discussed with Dr Marlou Porch, urology. No emergent need for urologic intervention/stent exchange if functioning which it appears to be. Continue abx. Discussed with medicine for admission.    Final Clinical  Impressions(s) / ED Diagnoses   Final diagnoses:  Pyelonephritis  Severe sepsis [facility])    ED Discharge Orders    None       Raeford Razor, MD 10/27/17 1244

## 2017-10-26 NOTE — Progress Notes (Addendum)
ANTICOAGULATION CONSULT NOTE - Initial Consult  Pharmacy Consult for coumadin Indication: on coumadin PTA for PE & DVT, Afib/Stroke  Allergies  Allergen Reactions  . Prednisone     Other reaction(s): Other (See Comments) unknown  . Zithromax [Azithromycin] Other (See Comments)    Weak and flulike symptoms    Patient Measurements: Height: 5\' 4"  (162.6 cm) Weight: 169 lb 5 oz (76.8 kg) IBW/kg (Calculated) : 59.2  Vital Signs: Temp: 102.8 F (39.3 C) (10/22 1317) Temp Source: Rectal (10/22 1317) BP: 139/87 (10/22 1600) Pulse Rate: 109 (10/22 1600)  Labs: Recent Labs    10/26/17 1201 10/26/17 1245  HGB 14.9  --   HCT 45.8  --   PLT 155  --   LABPROT  --  36.6*  INR  --  3.76  CREATININE 1.71*  --     Estimated Creatinine Clearance: 34.4 mL/min (A) (by C-G formula based on SCr of 1.71 mg/dL (H)).   Medical History: Past Medical History:  Diagnosis Date  . Cirrhosis (HCC)    noted on CT 05/28/16  . Closed compression fracture of L1 lumbar vertebra 05/2016  . Diabetes mellitus   . Diverticulitis   . DVT (deep venous thrombosis) (HCC) 2002  . Dyspnea    with exertion  . History of kidney stones   . Hyperlipidemia   . Hypertension   . Insomnia   . Kidney stones    hx of  . Liver mass, right lobe    noted on CT 05/28/16  . Pancreatic cyst    noted on CT 05/28/16  . Perforated diverticulum   . Presence of inferior vena cava filter 2002  . Pulmonary embolism (HCC) 2002  . Secondary hypercoagulability disorder (HCC)   . Status post placement of ureteral stent:R 10/15/2017 10/26/2017  . Stroke [facility]) 2002   right eye no peripheral vision, short term memory loss    Assessment: [name] admitted with sepsis.  He is on coumadin PTA for PE/DVT 2002, Afib and stroke 2002.  Pharmacy consulted to dose coumadin.  Home coumadin dose is 2 mg M/F and 3 mg all other days.  Last dose 10/21 at 09 am.  Admission INR slightly elevated at 3.75, Hg 14.9, plct 155. No overt  bleeding reported. No DDIs noted.   Goal of Therapy:  INR 2-3 Monitor platelets by anticoagulation protocol: Yes   Plan:  Coumadin 1 mg po x 1 dose today Daily INR  Herby Abraham, Pharm.D [phone] 10/26/2017 5:16 PM

## 2017-10-26 NOTE — ED Notes (Signed)
Ed provider [place] Notified patient has a critical Lactic Acid value of 7.15

## 2017-10-26 NOTE — H&P (Signed)
History and Physical    [name]:[id number] DOB: 07-Jun-1940 DOA: 10/26/2017  PCP: [name], MD  Patient coming from: Home  I have personally briefly reviewed patient's old medical records in [facility]  Chief Complaint: Weakness.  HPI: [name] is a 77 y.o. male with medical history significant of nephrolithiasis with recent right UPJ calculus status post cystoscopy and right urethral stent placement per Dr. Laverle Patter 10/15/2017, diabetes mellitus, hypertension, history of DVT/PE status post IVC filter and on chronic Coumadin therapy, history of atrial fibrillation, history of cirrhosis noted on CT 05/28/2016, pancreatic cyst noted on CT 05/28/2016, right liver mass noted on CT 05/28/2016, history of stroke presenting to the ED with generalized weakness.  Patient states since his discharge from the hospital 10/16/2017 when this initially feeling better however over the past week he has had progressive generalized weakness, nausea, one episode of nonbloody emesis.  Patient also with complaints of worsening right flank pain and right thoracic back pain.  Patient does endorse some subjective fevers however denies any chills.  No chest pain, no shortness of breath, no abdominal pain, no lightheadedness, no syncope, no weakness, no diarrhea, no constipation, no dysuria, no melena, no hematemesis, no hematochezia, no visual field deficits noted.  Patient stated that 2 days prior to admission he fell on the floor and laid on the floor for 5 hours due to significantly decreased energy.  Patient subsequently presented to the ED and hospitalist were called to admit the patient for sepsis with acute organ dysfunction.  ED Course: Patient seen in the ED.  Urinalysis done had a glucose of greater than 500 small leukocytes, negative nitrite, 21-50 WBCs, greater than 50 RBCs.  Compressive metabolic profile done had a sodium of 134 bicarb of 16 glucose of 415 BUN of 34 creatinine of 1.71 AST of  97 ALT of 41 direct bilirubin 0.4 otherwise rest of compressive metabolic profile within normal limits.  Lactic acid was elevated at 7.15.  CBC had a white count of 23.6.  Chest x-ray with stable mild cardiomegaly no active lung disease.  CT abdomen and pelvis done with interval placement of right nephroureteral stent with decompressed collecting system, new right perinephric fat stranding, potential UTI, bilateral nonobstructing nephrolithiasis, cirrhosis and sequelae of portal hypertension without ascites, 15 mm pancreatic cystic mass.  Patient given 2 L of IV fluid, pancultured, IV vancomycin IV cefepime.  Hospitalist were called to admit the patient for further evaluation and management.  Review of Systems: As per HPI otherwise 10 point review of systems negative.   Past Medical History:  Diagnosis Date  . Cirrhosis (HCC)    noted on CT 05/28/16  . Closed compression fracture of L1 lumbar vertebra 05/2016  . Diabetes mellitus   . Diverticulitis   . DVT (deep venous thrombosis) (HCC) 2002  . Dyspnea    with exertion  . History of kidney stones   . Hyperlipidemia   . Hypertension   . Insomnia   . Kidney stones    hx of  . Liver mass, right lobe    noted on CT 05/28/16  . Pancreatic cyst    noted on CT 05/28/16  . Perforated diverticulum   . Presence of inferior vena cava filter 2002  . Pulmonary embolism (HCC) 2002  . Secondary hypercoagulability disorder (HCC)   . Status post placement of ureteral stent:R 10/15/2017 10/26/2017  . Stroke [facility]) 2002   right eye no peripheral vision, short term memory loss  Past Surgical History:  Procedure Laterality Date  . APPENDECTOMY  1949  . BACK SURGERY    . CARPAL TUNNEL RELEASE  2002 both wrists  . COLON SURGERY  07/19/2009   sigmoid colectomy   . CYSTOSCOPY WITH BIOPSY N/A 01/26/2017   Procedure: CYSTOSCOPY;  Surgeon: Bjorn Pippin, MD;  Location: [facility];  Service: Urology;  Laterality: N/A;  . CYSTOSCOPY WITH STENT  PLACEMENT Right 10/15/2017   Procedure: CYSTOSCOPY WITH STENT PLACEMENT;  Surgeon: Heloise Purpura, MD;  Location: [facility];  Service: Urology;  Laterality: Right;  . HERNIA REPAIR   1983, 1983, 02/03/2010   lap ventral hernia repair  . KIDNEY STONE SURGERY  1977, 1983  . LITHOTRIPSY  2010   bleeding in kidney after  . surgery for diverticulosis  2011  . TONSILLECTOMY  1947  . TONSILLECTOMY AND ADENOIDECTOMY    . VENTRAL HERNIA REPAIR  09/11/2011   Procedure: LAPAROSCOPIC VENTRAL HERNIA;  Surgeon: Kandis Cocking, MD;  Location: [facility];  Service: General;  Laterality: N/A;  Lapaaroscopic Repair Ventral Incisional Hernias x 2     reports that he has never smoked. He has never used smokeless tobacco. He reports that he does not drink alcohol or use drugs.  Allergies  Allergen Reactions  . Prednisone     Other reaction(s): Other (See Comments) unknown  . Zithromax [Azithromycin] Other (See Comments)    Weak and flulike symptoms    Family History  Problem Relation Age of Onset  . Lung disease Father   . Cancer Neg Hx    Father deceased age 65 from lung disease per patient.  Mother alive age 20 and a nursing home.  Prior to Admission medications   Medication Sig Start Date End Date Taking? Authorizing Provider  glimepiride (AMARYL) 4 MG tablet Take 4 mg by mouth every morning.  03/10/10  Yes [provider]  glipiZIDE (GLUCOTROL XL) 2.5 MG 24 hr tablet Take 2.5 mg by mouth daily. 10/18/17  Yes [provider]  HYDROcodone-acetaminophen (NORCO/VICODIN) 5-325 MG tablet Take 1-2 tablets by mouth every 6 (six) hours as needed. 10/15/17  Yes Heloise Purpura, MD  losartan (COZAAR) 100 MG tablet Take 100 mg by mouth every morning.    Yes [provider]  metFORMIN (GLUCOPHAGE-XR) 500 MG 24 hr tablet Take 1,000 mg by mouth 2 (two) times daily. 10/16/17  Yes [provider]  ondansetron (ZOFRAN) 4 MG tablet Take 1 tablet (4 mg total) by mouth every 8 (eight) hours  as needed for nausea or vomiting. 10/15/17  Yes Heloise Purpura, MD  oxyCODONE-acetaminophen (PERCOCET/ROXICET) 5-325 MG tablet Take 1 tablet by mouth every 6 (six) hours. 10/19/17  Yes [provider]  simvastatin (ZOCOR) 40 MG tablet Take 40 mg by mouth every evening.    Yes [provider]  warfarin (COUMADIN) 2 MG tablet Take 2 mg by mouth daily. 3 MG on three days and 2 MG on the other 4 days   Yes [provider]    Physical Exam: Vitals:   10/26/17 1330 10/26/17 1402 10/26/17 1430 10/26/17 1500  BP: (!) 175/87 (!) 154/81 (!) 159/79 (!) 161/73  Pulse: (!) 116 (!) 110 (!) 115 (!) 115  Resp: (!) 28 (!) 33 (!) 27 (!) 38  Temp:      TempSrc:      SpO2: 93% 96% 93% 93%  Weight:      Height:        Constitutional: NAD, calm, comfortable Vitals:  10/26/17 1330 10/26/17 1402 10/26/17 1430 10/26/17 1500  BP: (!) 175/87 (!) 154/81 (!) 159/79 (!) 161/73  Pulse: (!) 116 (!) 110 (!) 115 (!) 115  Resp: (!) 28 (!) 33 (!) 27 (!) 38  Temp:      TempSrc:      SpO2: 93% 96% 93% 93%  Weight:      Height:       Eyes: PERRL, lids and conjunctivae normal ENMT: Mucous membranes are dry. Posterior pharynx clear of any exudate or lesions.Normal dentition.  Neck: normal, supple, no masses, no thyromegaly Respiratory: clear to auscultation bilaterally, no wheezing, no crackles. Normal respiratory effort. No accessory muscle use.  Cardiovascular: Tachycardic.  No murmurs rubs or gallops.  No lower extremity edema.  2+ pedal pulses. No carotid bruits.  Abdomen: Right flank pain, soft, nondistended, positive bowel sounds.  Musculoskeletal: no clubbing / cyanosis. No joint deformity upper and lower extremities. Good ROM, no contractures. Normal muscle tone.  Skin: no rashes, lesions, ulcers. No induration Neurologic: CN 2-12 grossly intact. Sensation intact, DTR normal. Strength 5/5 in all 4.  Psychiatric: Normal judgment and insight. Alert and oriented x 3. Normal mood.    Labs on Admission: I have personally reviewed following labs and imaging studies  CBC: Recent Labs  Lab 10/26/17 1201  WBC 23.6*  HGB 14.9  HCT 45.8  MCV 94.8  PLT 155   Basic Metabolic Panel: Recent Labs  Lab 10/26/17 1201  NA 134*  K 4.3  CL 101  CO2 16*  GLUCOSE 415*  BUN 34*  CREATININE 1.71*  CALCIUM 8.8*   GFR: Estimated Creatinine Clearance: 34.4 mL/min (A) (by C-G formula based on SCr of 1.71 mg/dL (H)). Liver Function Tests: Recent Labs  Lab 10/26/17 1201  AST 97*  ALT 41  ALKPHOS 60  BILITOT 1.2  PROT 7.2  ALBUMIN 3.7   Recent Labs  Lab 10/26/17 1201  LIPASE 21   No results for input(s): AMMONIA in the last 168 hours. Coagulation Profile: Recent Labs  Lab 10/26/17 1245  INR 3.76   Cardiac Enzymes: No results for input(s): CKTOTAL, CKMB, CKMBINDEX, TROPONINI in the last 168 hours. BNP (last 3 results) No results for input(s): PROBNP in the last 8760 hours. HbA1C: No results for input(s): HGBA1C in the last 72 hours. CBG: Recent Labs  Lab 10/26/17 1139 10/26/17 1441  GLUCAP 400* 324*   Lipid Profile: No results for input(s): CHOL, HDL, LDLCALC, TRIG, CHOLHDL, LDLDIRECT in the last 72 hours. Thyroid Function Tests: No results for input(s): TSH, T4TOTAL, FREET4, T3FREE, THYROIDAB in the last 72 hours. Anemia Panel: No results for input(s): VITAMINB12, FOLATE, FERRITIN, TIBC, IRON, RETICCTPCT in the last 72 hours. Urine analysis:    Component Value Date/Time   COLORURINE AMBER (A) 10/26/2017 1259   APPEARANCEUR CLOUDY (A) 10/26/2017 1259   LABSPEC 1.020 10/26/2017 1259   PHURINE 6.0 10/26/2017 1259   GLUCOSEU >=500 (A) 10/26/2017 1259   HGBUR MODERATE (A) 10/26/2017 1259   BILIRUBINUR NEGATIVE 10/26/2017 1259   KETONESUR 5 (A) 10/26/2017 1259   PROTEINUR 100 (A) 10/26/2017 1259   UROBILINOGEN 0.2 06/09/2009 1446   NITRITE NEGATIVE 10/26/2017 1259   LEUKOCYTESUR SMALL (A) 10/26/2017 1259    Radiological Exams on  Admission: Ct Abdomen Pelvis Wo Contrast  Result Date: 10/26/2017 CLINICAL DATA:  Found down. On pain medication for kidney stones. Recent ureteral stent. Fever and hyperglycemia. History of diabetes, RIGHT stent placement October 15, 2017, kidney stones, pancreatic cyst, liver mass, cirrhosis, diverticulitis. EXAM:  CT ABDOMEN AND PELVIS WITHOUT CONTRAST TECHNIQUE: Multidetector CT imaging of the abdomen and pelvis was performed following the standard protocol without IV contrast. COMPARISON:  CT abdomen and pelvis October 15, 2017 and MRI abdomen May 29, 2016 FINDINGS: LOWER CHEST: Lung bases are clear. The visualized heart size is normal. Advanced coronary artery calcifications, incompletely evaluated. No pericardial effusion. HEPATOBILIARY: Nodular cirrhotic liver with enlarged LEFT lobe and caudate lobes. Subtle hypodense mass RIGHT lobe of the liver less conspicuous than prior imaging, likely technical. Negative non-contrast CT gallbladder. PANCREAS: Nonacute. 15 mm complex mass tail of pancreas, previously evaluated with MRI. Punctate calcifications and atrophy. SPLEEN: Mild splenomegaly. ADRENALS/URINARY TRACT: Kidneys are orthotopic, demonstrating normal size. New RIGHT nephroureteral stent with proximal retaining loop within renal pelvis. Increasing RIGHT perinephric fat stranding. 8 mm calculus renal pelvis remains. No hydronephrosis. 11 mm RIGHT lower pole nephrolithiasis. 12 mm LEFT lower pole nephrolithiasis, additional smaller LEFT nephrolithiasis. Stable exophytic 12 mm calcified LEFT renal mass. 3.4 cm cyst lower pole LEFT kidney. Limited assessment for renal masses by nonenhanced CT. Urinary bladder is well distended containing distal retaining loop. Normal adrenal glands. STOMACH/BOWEL: The stomach, small and large bowel are normal in course and caliber without inflammatory changes, sensitivity decreased by lack of enteric contrast. Small volume retained large bowel stool. A few scattered  colonic diverticula noted. VASCULAR/LYMPHATIC: Aortoiliac vessels are normal in course and caliber. Mild calcific atherosclerosis. Inferior vena cava filter below the level the renal veins. Paraesophageal varicosities. No lymphadenopathy by CT size criteria. REPRODUCTIVE: Mild prostatomegaly. OTHER: No intraperitoneal free fluid or free air. Status post anterior abdominal wall herniorrhaphy. MUSCULOSKELETAL: Non-acute. Linear sclerosis RIGHT femoral head equivocal for avascular necrosis without collapse. Osteopenia. Old moderate to severe L1 compression fracture and mild L5 compression fracture. Scattered old Schmorl's nodes. IMPRESSION: 1. Interval placement of RIGHT nephroureteral stent with decompressed collecting system. New RIGHT perinephric fat stranding, potential urinary tract infection. 2. Bilateral nonobstructing nephrolithiasis. 3. Cirrhosis and sequelae of portal hypertension without ascites. 4. 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 Aortic Atherosclerosis (ICD10-I70.0). Electronically Signed   By: Awilda Metro M.D.   On: 10/26/2017 14:29   Dg Chest Portable 1 View  Result Date: 10/26/2017 CLINICAL DATA:  Weakness, rapid heart rate EXAM: PORTABLE CHEST 1 VIEW COMPARISON:  Chest x-ray of 05/28/2016 FINDINGS: No active infiltrate or effusion is seen. Mediastinal and hilar contours are unremarkable. The heart is mildly enlarged and stable. No acute bony abnormality is seen. IMPRESSION: Stable mild cardiomegaly.  No active lung disease. Electronically Signed   By: Dwyane Dee M.D.   On: 10/26/2017 13:12    EKG: Independently reviewed.  Not done  Assessment/Plan Principal Problem:   Severe sepsis with acute organ dysfunction (HCC) Active Problems:   Acute pyelonephritis   HTN (hypertension)   DM II (diabetes mellitus, type II), controlled (HCC)   Closed compression fracture of body of lumbar vertebra (HCC)   AF (atrial fibrillation) (HCC)   Hyperlipidemia   History of stroke   Presence  of inferior vena cava filter   Right ureteral stone   Sepsis (HCC)   ARF (acute renal failure) (HCC)   Metabolic acidosis   Acute lower UTI   Status post placement of ureteral stent:R 10/15/2017   Dehydration   #1 severe sepsis with organ dysfunction secondary to probable acute pyelonephritis/UTI in the setting of recent urethral stent placement. Patient presenting with generalized weakness noted to have a temperature of 102.8, leukocytosis with a white count of 23.6, noted to be  tachycardic, tachypneic.  Compressive metabolic profile with elevated creatinine.  Patient noted to have a lactic acid level of 7.15.  Urinalysis worrisome for possible UTI.  CT abdomen and pelvis concerning for acute pyelonephritis.  Patient placed on 2 L IV fluids.  Patient with recent right ureteral stent placement 10/15/2017 concern for source of infection.  Blood cultures and urine cultures have been obtained and currently pending.  Check a procalcitonin level.  Repeat lactic acid level in the morning.  Admit to the stepdown unit.  Place empirically on IV vancomycin and IV cefepime.  IV fluids.  EDP stated he spoke with urology, Dr. Marlou Porch who looked at CT films and felt no surgical intervention needed at this time and management with antibiotics.  Supportive care.  2.  Acute renal failure Likely secondary to prerenal azotemia in the setting of ARB.  Check a fractional excretion of sodium.  CT abdomen and pelvis with no signs of hydronephrosis noted.  Hold ARB.  Hydrate with IV fluids and follow.  3.  Metabolic acidosis Likely secondary to problem #1 as patient noted to have elevated lactic acid level versus low likelihood of early DKA.  Urine ketones are minimal.  Patient does have an anion gap of 17.  Initial CBG was 415.  Repeat blood glucose was 324.  Check a procalcitonin level.  Placed on IV fluids.  Repeat basic metabolic profile.  Place on sliding scale insulin and follow.  If worsening blood glucose levels  and worsening acidosis with increasing INR gap may need to place on the glucose stabilizer.  Continue empiric IV antibiotics of vancomycin and cefepime.  Follow.  4.  Dehydration IV fluids.  5.  History of right UPG calculus status post right urethral stent placement EDP stated he spoke with Dr. Marlou Porch of urology.  It was felt a review of CT scan no surgical intervention needed at this time and his treatment of patient's sepsis.  6.  Diabetes mellitus type 2 Patient noted to have an initial blood glucose level of 415 also noted to be in the metabolic acidosis with small ketones noted in the urine.  Patient however with a elevated lactic acid level and likely sepsis causing patient's metabolic acidosis.  Repeat basic metabolic profile.  Check a hemoglobin A1c.  Place on a sliding scale insulin.  May need to start on low-dose Lantus however will monitor for now.  7.  History of atrial fibrillation Currently in normal sinus rhythm and rate controlled.  Coumadin for anticoagulation.  8.  History of DVT/PE status post IVC filter Coumadin per pharmacy.  9.  Compression fractures Continue home pain regimen.  10.  Hypertension Hold Cozaar secondary to acute renal failure.  Hydralazine as needed.   DVT prophylaxis: INR therapeutic.  On Coumadin. Code Status: Full Family Communication: Updated patient.  No family at bedside. Disposition Plan: Likely home when clinically improved with resolution of sepsis. Consults called: None Admission status: Admit to inpatient.   Ramiro Harvest MD [facility] Pager [phone]  If 7PM-7AM, please contact night-coverage [email or web address] Password [facility]  10/26/2017, 4:45 PM

## 2017-10-27 ENCOUNTER — Inpatient Hospital Stay (HOSPITAL_COMMUNITY): Payer: Medicare Other

## 2017-10-27 DIAGNOSIS — R7881 Bacteremia: Secondary | ICD-10-CM

## 2017-10-27 DIAGNOSIS — E1165 Type 2 diabetes mellitus with hyperglycemia: Secondary | ICD-10-CM

## 2017-10-27 DIAGNOSIS — E1110 Type 2 diabetes mellitus with ketoacidosis without coma: Secondary | ICD-10-CM

## 2017-10-27 DIAGNOSIS — K7460 Unspecified cirrhosis of liver: Secondary | ICD-10-CM

## 2017-10-27 DIAGNOSIS — N179 Acute kidney failure, unspecified: Secondary | ICD-10-CM

## 2017-10-27 DIAGNOSIS — K862 Cyst of pancreas: Secondary | ICD-10-CM

## 2017-10-27 DIAGNOSIS — K766 Portal hypertension: Secondary | ICD-10-CM

## 2017-10-27 DIAGNOSIS — N132 Hydronephrosis with renal and ureteral calculous obstruction: Secondary | ICD-10-CM

## 2017-10-27 DIAGNOSIS — I5030 Unspecified diastolic (congestive) heart failure: Secondary | ICD-10-CM

## 2017-10-27 DIAGNOSIS — Z960 Presence of urogenital implants: Secondary | ICD-10-CM

## 2017-10-27 DIAGNOSIS — B952 Enterococcus as the cause of diseases classified elsewhere: Secondary | ICD-10-CM

## 2017-10-27 LAB — BLOOD CULTURE ID PANEL (REFLEXED)

## 2017-10-27 LAB — BASIC METABOLIC PANEL
Anion gap: 11 (ref 5–15)
BUN: 34 mg/dL — ABNORMAL HIGH (ref 8–23)
CALCIUM: 7.9 mg/dL — AB (ref 8.9–10.3)
CHLORIDE: 104 mmol/L (ref 98–111)
CO2: 21 mmol/L — AB (ref 22–32)
CREATININE: 1.27 mg/dL — AB (ref 0.61–1.24)
GFR calc Af Amer: >60 mL/min (ref >60)
GFR calc non Af Amer: 53 mL/min — ABNORMAL LOW (ref >60)
Glucose, Bld: 317 mg/dL — ABNORMAL HIGH (ref 70–99)
Potassium: 4.7 mmol/L (ref 3.5–5.1)
Sodium: 136 mmol/L (ref 135–145)

## 2017-10-27 LAB — GLUCOSE, CAPILLARY
Glucose-Capillary: 325 mg/dL — ABNORMAL HIGH (ref 70–99)
Glucose-Capillary: 315 mg/dL — ABNORMAL HIGH (ref 70–99)
Glucose-Capillary: 224 mg/dL — ABNORMAL HIGH (ref 70–99)
Glucose-Capillary: 254 mg/dL — ABNORMAL HIGH (ref 70–99)
Glucose-Capillary: 291 mg/dL — ABNORMAL HIGH (ref 70–99)

## 2017-10-27 LAB — ECHOCARDIOGRAM COMPLETE
Height: 64 in
Weight: 2677.27 [oz_av]

## 2017-10-27 LAB — PROTIME-INR
INR: 4.86
PROTHROMBIN TIME: 44.6 s — AB (ref 11.4–15.2)

## 2017-10-27 LAB — CREATININE, URINE, RANDOM: CREATININE, URINE: 101.67 mg/dL

## 2017-10-27 LAB — URINE CULTURE

## 2017-10-27 LAB — HEMOGLOBIN A1C
Hgb A1c MFr Bld: 8.4 % — ABNORMAL HIGH (ref 4.8–5.6)
MEAN PLASMA GLUCOSE: 194 mg/dL

## 2017-10-27 LAB — CBC
HEMATOCRIT: 43.5 % (ref 39.0–52.0)
Hemoglobin: 14.1 g/dL (ref 13.0–17.0)
MCH: 30.1 pg (ref 26.0–34.0)
MCHC: 32.4 g/dL (ref 30.0–36.0)
MCV: 92.9 fL (ref 80.0–100.0)
Platelets: 118 10*3/uL — ABNORMAL LOW (ref 150–400)
RBC: 4.68 MIL/uL (ref 4.22–5.81)
RDW: 14.1 % (ref 11.5–15.5)
WBC: 17.7 10*3/uL — AB (ref 4.0–10.5)
nRBC: 0 % (ref 0.0–0.2)

## 2017-10-27 LAB — LACTIC ACID, PLASMA: Lactic Acid, Venous: 2.5 mmol/L (ref 0.5–1.9)

## 2017-10-27 LAB — PROCALCITONIN: PROCALCITONIN: 1.95 ng/mL

## 2017-10-27 LAB — SODIUM, URINE, RANDOM: SODIUM UR: 30 mmol/L

## 2017-10-27 MED ORDER — INSULIN GLARGINE 100 UNIT/ML ~~LOC~~ SOLN
10.0000 [IU] | Freq: Two times a day (BID) | SUBCUTANEOUS | Status: DC
  Administered 2017-10-27 – 2017-10-28 (×3): 10 [IU] via SUBCUTANEOUS
  Filled 2017-10-27 (×4): qty 0.1

## 2017-10-27 MED ORDER — SODIUM CHLORIDE 0.9 % IV SOLN
2.0000 g | Freq: Four times a day (QID) | INTRAVENOUS | Status: DC
  Administered 2017-10-27 – 2017-10-28 (×5): 2 g via INTRAVENOUS
  Filled 2017-10-27 (×5): qty 2
  Filled 2017-10-27: qty 2000

## 2017-10-27 NOTE — Progress Notes (Signed)
PHARMACY - PHYSICIAN COMMUNICATION CRITICAL VALUE ALERT - BLOOD CULTURE IDENTIFICATION (BCID)  [name] is an 77 y.o. male who presented to [facility] on 10/26/2017 with a chief complaint of weakness, s/p recent urology procedure, admitted with urosepsis.  Assessment:  Urosepsis, BCx with enterococcus, no vancomycin resistant detected.  UCx pending  Name of physician (or Provider) Contacted: Dr Clearnce Sorrel  Current antibiotics: Vancomycin and Cefepime  Changes to prescribed antibiotics recommended:  Recommendations accepted by provider  - Ampicillin 2 g IV q6h - Frequency reduced from q4h to q6h for CrCl 10-50 ml/min.  Pharmacy will follow for dosing and further renal function changes.  Results for orders placed or performed during the hospital encounter of 10/26/17  Blood Culture ID Panel (Reflexed) (Collected: 10/26/2017 12:34 PM)  Result Value Ref Range   Enterococcus species DETECTED (A) NOT DETECTED   Vancomycin resistance NOT DETECTED NOT DETECTED   Listeria monocytogenes NOT DETECTED NOT DETECTED   Staphylococcus species NOT DETECTED NOT DETECTED   Staphylococcus aureus (BCID) NOT DETECTED NOT DETECTED   Streptococcus species NOT DETECTED NOT DETECTED   Streptococcus agalactiae NOT DETECTED NOT DETECTED   Streptococcus pneumoniae NOT DETECTED NOT DETECTED   Streptococcus pyogenes NOT DETECTED NOT DETECTED   Acinetobacter baumannii NOT DETECTED NOT DETECTED   Enterobacteriaceae species NOT DETECTED NOT DETECTED   Enterobacter cloacae complex NOT DETECTED NOT DETECTED   Escherichia coli NOT DETECTED NOT DETECTED   Klebsiella oxytoca NOT DETECTED NOT DETECTED   Klebsiella pneumoniae NOT DETECTED NOT DETECTED   Proteus species NOT DETECTED NOT DETECTED   Serratia marcescens NOT DETECTED NOT DETECTED   Haemophilus influenzae NOT DETECTED NOT DETECTED   Neisseria meningitidis NOT DETECTED NOT DETECTED   Pseudomonas aeruginosa NOT DETECTED NOT DETECTED   Candida albicans NOT  DETECTED NOT DETECTED   Candida glabrata NOT DETECTED NOT DETECTED   Candida krusei NOT DETECTED NOT DETECTED   Candida parapsilosis NOT DETECTED NOT DETECTED   Candida tropicalis NOT DETECTED NOT DETECTED   Lynann Beaver PharmD, BCPS Pager [phone] 10/27/2017 9:21 AM

## 2017-10-27 NOTE — Progress Notes (Signed)
  Echocardiogram 2D Echocardiogram has been performed.  [name] 10/27/2017, 2:55 PM

## 2017-10-27 NOTE — Evaluation (Signed)
Physical Therapy Evaluation Patient Details Name: [name] MRN: [id number] DOB: November 29, 1940 Today's Date: 10/27/2017   History of Present Illness  ABHIJAY MORRISS is a 77 y.o. male with medical history significant of nephrolithiasis with recent right UPJ calculus status post cystoscopy and right urethral stent placement 10/15/2017, diabetes mellitus, hypertension, history of DVT/PE status post IVC filter, history of atrial fibrillation, history of cirrhosis, pancreatic cyst noted on CT 05/28/2016, right liver mass noted on CT 05/28/2016, history of stroke presenting to the ED 10/22 with history of fall 2 days PTA, admitted  with generalized weakness, fever,  worsening R flank pain , UA was notable for pyuria, was tachycardic, tachypneic and hypertensive  Clinical Impression  The patient presents with weakness, not near his baseline of independence. The patient's HR up to 127. Patient had an incontinent BM prior to mobility. RN notified to check due to dark color. The patient lives in second level apartment. Patient may benefit from Rehab post DC in order  to return to independent level.  Pt admitted with above diagnosis. Pt currently with functional limitations due to the deficits listed below (see PT Problem List).  Pt will benefit from skilled PT to increase their independence and safety with mobility to allow discharge to the venue listed below.       Follow Up Recommendations SNF;Supervision/Assistance - 24 hour vs HHPT if to home.     Equipment Recommendations  None recommended by PT    Recommendations for Other Services       Precautions / Restrictions Precautions Precautions: Fall Precaution Comments: was incintinent of BM      Mobility  Bed Mobility Overal bed mobility: Needs Assistance Bed Mobility: Supine to Sit     Supine to sit: Min assist     General bed mobility comments: extra time to move legs and trunk,  Transfers Overall transfer level: Needs  assistance Equipment used: Rolling walker (2 wheeled) Transfers: Sit to/from [employer] Sit to Stand: Min assist Stand pivot transfers: Min assist       General transfer comment: steady assist to rise from bed and BSC with a little more assistance from lower surface.   Ambulation/Gait Ambulation/Gait assistance: Min assist Gait Distance (Feet): 5 Feet Assistive device: Rolling walker (2 wheeled) Gait Pattern/deviations: Step-to pattern     General Gait Details: from [facility] to recliner. HR 127.  Stairs            Wheelchair Mobility    Modified Rankin (Stroke Patients Only)       Balance Overall balance assessment: History of Falls;Needs assistance Sitting-balance support: Feet supported;No upper extremity supported Sitting balance-Leahy Scale: Good     Standing balance support: During functional activity;Bilateral upper extremity supported Standing balance-Leahy Scale: Fair                               Pertinent Vitals/Pain Pain Assessment: No/denies pain    Home Living Family/patient expects to be discharged to:: Private residence Living Arrangements: Alone Available Help at Discharge: Friend(s);Available PRN/intermittently Type of Home: Apartment Home Access: Stairs to enter Entrance Stairs-Rails: Right Entrance Stairs-Number of Steps: 6 then 9   Home Equipment: Walker - 4 wheels      Prior Function Level of Independence: Independent         Comments: drives     Hand Dominance        Extremity/Trunk Assessment   Upper Extremity Assessment Upper  Extremity Assessment: Defer to OT evaluation    Lower Extremity Assessment Lower Extremity Assessment: Generalized weakness    Cervical / Trunk Assessment Cervical / Trunk Assessment: Normal  Communication   Communication: No difficulties  Cognition Arousal/Alertness: Awake/alert Behavior During Therapy: WFL for tasks assessed/performed Overall Cognitive  Status: Within Functional Limits for tasks assessed                                        General Comments      Exercises     Assessment/Plan    PT Assessment Patient needs continued PT services  PT Problem List Decreased strength;Decreased activity tolerance;Decreased mobility;Decreased safety awareness;Decreased knowledge of precautions;Decreased knowledge of use of DME;Cardiopulmonary status limiting activity       PT Treatment Interventions DME instruction;Gait training;Functional mobility training;Stair training;Therapeutic activities;Patient/family education;Therapeutic exercise    PT Goals (Current goals can be found in the Care Plan section)  Acute Rehab PT Goals Patient Stated Goal: to get up and go PT Goal Formulation: With patient Time For Goal Achievement: 11/10/17 Potential to Achieve Goals: Good    Frequency Min 2X/week   Barriers to discharge Decreased caregiver support      Co-evaluation               AM-PAC PT "6 Clicks" Daily Activity  Outcome Measure Difficulty turning over in bed (including adjusting bedclothes, sheets and blankets)?: A Lot Difficulty moving from lying on back to sitting on the side of the bed? : A Lot Difficulty sitting down on and standing up from a chair with arms (e.g., wheelchair, bedside commode, etc,.)?: A Lot Help needed moving to and from a bed to chair (including a wheelchair)?: Total Help needed walking in hospital room?: Total Help needed climbing 3-5 steps with a railing? : Total 6 Click Score: 9    End of Session Equipment Utilized During Treatment: Gait belt Activity Tolerance: Patient tolerated treatment well Patient left: in chair;with call bell/phone within reach;with chair alarm set Nurse Communication: Mobility status PT Visit Diagnosis: Unsteadiness on feet (R26.81)    Time: 1449-1520 PT Time Calculation (min) (ACUTE ONLY): 31 min   Charges:   PT Evaluation $PT Eval Low  Complexity: 1 Low PT Treatments $Therapeutic Activity: 8-22 mins        Blanchard Kelch PT Acute Rehabilitation Services Pager [phone] Office [phone]   Rada Hay 10/27/2017, 4:18 PM

## 2017-10-27 NOTE — Care Management Note (Signed)
Case Management Note  Patient Details  Name: [name] MRN: [id number] Date of Birth: 01-Jan-1941  Subjective/Objective:                  Sepsis with acute organ failure Bld. Culture positive for enterococcus Lactic acid is 2.5/wbc 17.7, bun 32 creat. 1.34 Action/Plan: Will follow for progression of care and clinical status. Will follow for case management needs none present at this time.  Expected Discharge Date:  (unknown)               Expected Discharge Plan:  Home/Self Care  In-House Referral:     Discharge planning Services  CM Consult  Post Acute Care Choice:    Choice offered to:     DME Arranged:    DME Agency:     HH Arranged:    HH Agency:     Status of Service:  In process, will continue to follow  If discussed at Long Length of Stay Meetings, dates discussed:    Additional Comments:  Golda Acre, RN 10/27/2017, 10:13 AM

## 2017-10-27 NOTE — Progress Notes (Signed)
ANTICOAGULATION CONSULT NOTE   Pharmacy Consult for Warfarin Indication: atrial fibrillation, pulmonary embolus, DVT and stroke  Allergies  Allergen Reactions  . Prednisone     Other reaction(s): Other (See Comments) unknown  . Zithromax [Azithromycin] Other (See Comments)    Weak and flulike symptoms    Patient Measurements: Height: 5\' 4"  (162.6 cm) Weight: 167 lb 5.3 oz (75.9 kg) IBW/kg (Calculated) : 59.2  Vital Signs: Temp: 98.7 F (37.1 C) (10/23 0800) Temp Source: Oral (10/23 0800) BP: 166/80 (10/23 0800) Pulse Rate: 99 (10/23 0800)  Labs: Recent Labs    10/26/17 1201 10/26/17 1245 10/26/17 1746 10/27/17 0554  HGB 14.9  --   --  14.1  HCT 45.8  --   --  43.5  PLT 155  --   --  118*  APTT  --   --  33  --   LABPROT  --  36.6*  --  44.6*  INR  --  3.76  --  4.86*  CREATININE 1.71*  --  1.34* 1.27*    Estimated Creatinine Clearance: 46.1 mL/min (A) (by C-G formula based on SCr of 1.27 mg/dL (H)).   Medications:  Scheduled:  . insulin aspart  0-15 Units Subcutaneous TID WC  . insulin aspart  0-5 Units Subcutaneous QHS  . insulin glargine  10 Units Subcutaneous BID  . simvastatin  40 mg Oral QPM  . sodium chloride flush  3 mL Intravenous Q12H  . Warfarin - Pharmacist Dosing Inpatient   Does not apply q1800   Infusions:  . sodium chloride 125 mL/hr at 10/27/17 0700  . ampicillin (OMNIPEN) IV      Assessment: [name] admitted 10/22 with urosepsis.  He is on warfarin PTA for PE/DVT 2002, Afib and stroke 2002.  Pharmacy consulted to dose warfarin.  PTA warfarin dose 3 mg daily except 2 mg on Mon/Fri.  Last dose 10/21. Admission INR 3.75  Today, 10/27/2017:  INR 4.86, supratherapeutic and increasing despite low dose warfarin given yesterday.  CBC: Hgb 14.1, stable; Plt decreased to 118  No bleeding or complications noted  Diet: carb modified  No major drug drug interactions.  Broad spectrum antibiotics may prolong INR.  Goal of Therapy:  INR  2-3 Monitor platelets by anticoagulation protocol: Yes   Plan:  HOLD warfarin today for elevated INR. Daily PT/INR. Monitor for signs and symptoms of bleeding.   Lynann Beaver PharmD, BCPS Pager [phone] 10/27/2017 9:48 AM

## 2017-10-27 NOTE — Progress Notes (Signed)
PROGRESS NOTE    [name]  ZOX:[id number] DOB: [birth year] DOA: 10/26/2017 PCP: [name], MD   Brief Narrative:  77 year old with past medical history relevant for DVT/PE status post IVC filter placement, paroxysmal atrial fibrillation on warfarin, hypertension, type 2 diabetes not on insulin, hyperlipidemia, nephrolithiasis status post right UPJ stent placement on 10/16/2017 for hydronephrosis , cirrhosis, pancreatic cyst (last MRI of abdomen on 05/29/2016 favors benign, repeat in 2 years), indeterminate liver lesion (MRI of abdomen on 05/29/2016 recommends repeat MRI in 3 months) who presents with profound weakness, sepsis, AKI found to have right-sided pyelonephritis with enterococcus bacteremia.   Assessment & Plan:   Principal Problem:   Severe sepsis with acute organ dysfunction (HCC) Active Problems:   HTN (hypertension)   DM II (diabetes mellitus, type II), controlled (HCC)   Closed compression fracture of body of lumbar vertebra (HCC)   AF (atrial fibrillation) (HCC)   Hyperlipidemia   History of stroke   Presence of inferior vena cava filter   Right ureteral stone   Sepsis (HCC)   ARF (acute renal failure) (HCC)   Metabolic acidosis   Acute pyelonephritis   Acute lower UTI   Status post placement of ureteral stent:R 10/15/2017   Dehydration   #) Enterococcus sepsis due to pyelonephritis: CT abdomen pelvis on admission showed evidence of pyelonephritis on the right.  Intestinal enough this is where he has had his stent placed approximately 10 days ago however there is no evidence of malposition of the stent. -Continue IV ampicillin started 10/27/2017 -Repeat blood cultures on 10/27/2017 - Blood cultures from 10/26/2017 growing enterococcus -We will order echo to rule out endocarditis  #) Nephrolithiasis status post right UPJ stent on 10/16/2017: Repeat CT imaging on admission shows that renal pelvis is decompressed. -Discussed with urology, they recommend  treating infection and then they will as an outpatient continue treating stones  #) AKI: Likely secondary to sepsis.  Improving -Hold nephrotoxins  #) DVT/PE/atrial fibrillation: Patient is mildly supratherapeutic INR, will hold -Continue warfarin, pharmacy consult  #) Type 2 diabetes: Patient is only on home oral hypoglycemics however his blood sugars here been quite elevated likely in the setting of sepsis. - Start glargine 10 units twice daily -Hold home glimepiride 4 mg every morning -Hold home glipizide 2.5 mg daily -Hold home metformin 500 mg twice daily  #) Cirrhosis/indeterminate liver lesion: Per MRI done on 05/29/2016 patient was noted to have cirrhosis and indeterminate liver lesion with recommendation for repeat MRI in 3 months.  This was not performed. -We will order repeat MRI of the abdomen to evaluate liver lesion once creatinine downtrends  #) Hypertension/hyperlipidemia: -Hold home losartan 100 mg every morning due to AKI -Continue simvastatin 40 mg nightly  #) Pancreatic lesion: Patient noted to have unusual necrotic lesion in the tail of the pancreas.  MRI on 05/29/2016.  Benign etiology with recommendation to repeat in 2 years. - Plan for repeat MRI in 2 years, approximately 05/2018  Fluids: Gentle IV fluids Electrolytes: Monitor and supplement Nutrition: Carb restricted diet  Prophylaxis: Warfarin  Disposition: Pending resolution of sepsis  Full code   Consultants:   Urology  Procedures:   None  Antimicrobials:  IV ampicillin started 10/27/2017   Subjective: Patient reports he is doing fairly well.  She continues to report significant weakness but denies any nausea, vomiting, diarrhea, cough, congestion  Objective: Vitals:   10/27/17 0500 10/27/17 0600 10/27/17 0700 10/27/17 0800  BP: (!) 147/79 (!) 157/88 (!) 168/66 Marland Kitchen)  166/80  Pulse: (!) 101 (!) 102 99 99  Resp: (!) 29 (!) 28 (!) 29 (!) 28  Temp:    98.7 F (37.1 C)  TempSrc:    Oral    SpO2: 90% 94% 92% 93%  Weight: 75.9 kg     Height:        Intake/Output Summary (Last 24 hours) at 10/27/2017 1016 Last data filed at 10/27/2017 0700 Gross per 24 hour  Intake 3355.29 ml  Output 750 ml  Net 2605.29 ml   Filed Weights   10/26/17 1201 10/27/17 0500  Weight: 76.8 kg 75.9 kg    Examination:  General exam: Appears calm and comfortable  Respiratory system: Clear to auscultation. Respiratory effort normal. Cardiovascular system: Regular rate and rhythm, no murmurs. Gastrointestinal system: Soft, nondistended, mildly tender to deep palpation, no rebound or guarding, plus bowel sounds. Central nervous system: Alert and oriented. No focal neurological deficits. Extremities: Trace lower extremity edema Skin: No rashes over visible skin Psychiatry: Judgement and insight appear normal. Mood & affect appropriate.     Data Reviewed: I have personally reviewed following labs and imaging studies  CBC: Recent Labs  Lab 10/26/17 1201 10/27/17 0554  WBC 23.6* 17.7*  HGB 14.9 14.1  HCT 45.8 43.5  MCV 94.8 92.9  PLT 155 118*   Basic Metabolic Panel: Recent Labs  Lab 10/26/17 1201 10/26/17 1746 10/27/17 0554  NA 134* 135 136  K 4.3 4.1 4.7  CL 101 105 104  CO2 16* 20* 21*  GLUCOSE 415* 327* 317*  BUN 34* 32* 34*  CREATININE 1.71* 1.34* 1.27*  CALCIUM 8.8* 8.3* 7.9*  MG  --  1.8  --    GFR: Estimated Creatinine Clearance: 46.1 mL/min (A) (by C-G formula based on SCr of 1.27 mg/dL (H)). Liver Function Tests: Recent Labs  Lab 10/26/17 1201  AST 97*  ALT 41  ALKPHOS 60  BILITOT 1.2  PROT 7.2  ALBUMIN 3.7   Recent Labs  Lab 10/26/17 1201  LIPASE 21   No results for input(s): AMMONIA in the last 168 hours. Coagulation Profile: Recent Labs  Lab 10/26/17 1245 10/27/17 0554  INR 3.76 4.86*   Cardiac Enzymes: No results for input(s): CKTOTAL, CKMB, CKMBINDEX, TROPONINI in the last 168 hours. BNP (last 3 results) No results for input(s): PROBNP  in the last 8760 hours. HbA1C: Recent Labs    10/26/17 1746  HGBA1C 8.4*   CBG: Recent Labs  Lab 10/26/17 1139 10/26/17 1441 10/26/17 2138 10/27/17 0746  GLUCAP 400* 324* 236* 325*   Lipid Profile: No results for input(s): CHOL, HDL, LDLCALC, TRIG, CHOLHDL, LDLDIRECT in the last 72 hours. Thyroid Function Tests: No results for input(s): TSH, T4TOTAL, FREET4, T3FREE, THYROIDAB in the last 72 hours. Anemia Panel: No results for input(s): VITAMINB12, FOLATE, FERRITIN, TIBC, IRON, RETICCTPCT in the last 72 hours. Sepsis Labs: Recent Labs  Lab 10/26/17 1223 10/26/17 1420 10/26/17 1746 10/27/17 0554  PROCALCITON  --   --  1.61 1.95  LATICACIDVEN 7.15* 6.73*  --  2.5*    Recent Results (from the past 240 hour(s))  Blood culture (routine x 2)     Status: None (Preliminary result)   Collection Time: 10/26/17 12:34 PM  Result Value Ref Range Status   Specimen Description   Final    BLOOD RIGHT ARM Performed at [facility], [address], [place], [place] [address]    Special Requests   Final    BOTTLES DRAWN AEROBIC AND ANAEROBIC Blood  Culture adequate volume Performed at [facility], [address], [place], [place] [address]    Culture  Setup Time   Final    GRAM POSITIVE COCCI IN BOTH AEROBIC AND ANAEROBIC BOTTLES Organism ID to follow CRITICAL RESULT CALLED TO, READ BACK BY AND VERIFIED WITH: PHARMD J LEGGE 10/27/17 AT 908 AM BY CM Performed at [facility], [address] [place], [place] [address]    Culture GRAM POSITIVE COCCI  Final   Report Status PENDING  Incomplete  Blood Culture ID Panel (Reflexed)     Status: Abnormal   Collection Time: 10/26/17 12:34 PM  Result Value Ref Range Status   Enterococcus species DETECTED (A) NOT DETECTED Final    Comment: CRITICAL RESULT CALLED TO, READ BACK BY AND VERIFIED WITH: PHARMD J LEGGE 10/27/17 AT 908 AM BY CM    Vancomycin resistance NOT DETECTED NOT DETECTED Final    Listeria monocytogenes NOT DETECTED NOT DETECTED Final   Staphylococcus species NOT DETECTED NOT DETECTED Final   Staphylococcus aureus (BCID) NOT DETECTED NOT DETECTED Final   Streptococcus species NOT DETECTED NOT DETECTED Final   Streptococcus agalactiae NOT DETECTED NOT DETECTED Final   Streptococcus pneumoniae NOT DETECTED NOT DETECTED Final   Streptococcus pyogenes NOT DETECTED NOT DETECTED Final   Acinetobacter baumannii NOT DETECTED NOT DETECTED Final   Enterobacteriaceae species NOT DETECTED NOT DETECTED Final   Enterobacter cloacae complex NOT DETECTED NOT DETECTED Final   Escherichia coli NOT DETECTED NOT DETECTED Final   Klebsiella oxytoca NOT DETECTED NOT DETECTED Final   Klebsiella pneumoniae NOT DETECTED NOT DETECTED Final   Proteus species NOT DETECTED NOT DETECTED Final   Serratia marcescens NOT DETECTED NOT DETECTED Final   Haemophilus influenzae NOT DETECTED NOT DETECTED Final   Neisseria meningitidis NOT DETECTED NOT DETECTED Final   Pseudomonas aeruginosa NOT DETECTED NOT DETECTED Final   Candida albicans NOT DETECTED NOT DETECTED Final   Candida glabrata NOT DETECTED NOT DETECTED Final   Candida krusei NOT DETECTED NOT DETECTED Final   Candida parapsilosis NOT DETECTED NOT DETECTED Final   Candida tropicalis NOT DETECTED NOT DETECTED Final    Comment: Performed at [facility], [address], [place], [place] [address]  Blood culture (routine x 2)     Status: None (Preliminary result)   Collection Time: 10/26/17 12:45 PM  Result Value Ref Range Status   Specimen Description   Final    BLOOD LEFT ARM Performed at [facility], [address] [place], [place] [address]    Special Requests   Final    BOTTLES DRAWN AEROBIC AND ANAEROBIC Blood Culture results may not be optimal due to an excessive volume of blood received in culture bottles Performed at [facility], [address] [place], [place] [address]    Culture    Final    NO GROWTH < 24 HOURS Performed at [facility], [address] [place], [place] [address]    Report Status PENDING  Incomplete  MRSA PCR Screening     Status: None   Collection Time: 10/26/17  5:44 PM  Result Value Ref Range Status   MRSA by PCR NEGATIVE NEGATIVE Final    Comment:        The GeneXpert MRSA Assay (FDA approved for NASAL specimens only), is one component of a comprehensive MRSA colonization surveillance program. It is not intended to diagnose MRSA infection nor to guide or monitor treatment for MRSA  infections. Performed at [facility], [address], [place], [place] [address]          Radiology Studies: Ct Abdomen Pelvis Wo Contrast  Result Date: 10/26/2017 CLINICAL DATA:  Found down. On pain medication for kidney stones. Recent ureteral stent. Fever and hyperglycemia. History of diabetes, RIGHT stent placement October 15, 2017, kidney stones, pancreatic cyst, liver mass, cirrhosis, diverticulitis. EXAM: CT ABDOMEN AND PELVIS WITHOUT CONTRAST TECHNIQUE: Multidetector CT imaging of the abdomen and pelvis was performed following the standard protocol without IV contrast. COMPARISON:  CT abdomen and pelvis October 15, 2017 and MRI abdomen May 29, 2016 FINDINGS: LOWER CHEST: Lung bases are clear. The visualized heart size is normal. Advanced coronary artery calcifications, incompletely evaluated. No pericardial effusion. HEPATOBILIARY: Nodular cirrhotic liver with enlarged LEFT lobe and caudate lobes. Subtle hypodense mass RIGHT lobe of the liver less conspicuous than prior imaging, likely technical. Negative non-contrast CT gallbladder. PANCREAS: Nonacute. 15 mm complex mass tail of pancreas, previously evaluated with MRI. Punctate calcifications and atrophy. SPLEEN: Mild splenomegaly. ADRENALS/URINARY TRACT: Kidneys are orthotopic, demonstrating normal size. New RIGHT nephroureteral stent with proximal retaining loop within renal  pelvis. Increasing RIGHT perinephric fat stranding. 8 mm calculus renal pelvis remains. No hydronephrosis. 11 mm RIGHT lower pole nephrolithiasis. 12 mm LEFT lower pole nephrolithiasis, additional smaller LEFT nephrolithiasis. Stable exophytic 12 mm calcified LEFT renal mass. 3.4 cm cyst lower pole LEFT kidney. Limited assessment for renal masses by nonenhanced CT. Urinary bladder is well distended containing distal retaining loop. Normal adrenal glands. STOMACH/BOWEL: The stomach, small and large bowel are normal in course and caliber without inflammatory changes, sensitivity decreased by lack of enteric contrast. Small volume retained large bowel stool. A few scattered colonic diverticula noted. VASCULAR/LYMPHATIC: Aortoiliac vessels are normal in course and caliber. Mild calcific atherosclerosis. Inferior vena cava filter below the level the renal veins. Paraesophageal varicosities. No lymphadenopathy by CT size criteria. REPRODUCTIVE: Mild prostatomegaly. OTHER: No intraperitoneal free fluid or free air. Status post anterior abdominal wall herniorrhaphy. MUSCULOSKELETAL: Non-acute. Linear sclerosis RIGHT femoral head equivocal for avascular necrosis without collapse. Osteopenia. Old moderate to severe L1 compression fracture and mild L5 compression fracture. Scattered old Schmorl's nodes. IMPRESSION: 1. Interval placement of RIGHT nephroureteral stent with decompressed collecting system. New RIGHT perinephric fat stranding, potential urinary tract infection. 2. Bilateral nonobstructing nephrolithiasis. 3. Cirrhosis and sequelae of portal hypertension without ascites. 4. 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 Aortic Atherosclerosis (ICD10-I70.0). Electronically Signed   By: Awilda Metro M.D.   On: 10/26/2017 14:29   Dg Chest Portable 1 View  Result Date: 10/26/2017 CLINICAL DATA:  Weakness, rapid heart rate EXAM: PORTABLE CHEST 1 VIEW COMPARISON:  Chest x-ray of 05/28/2016 FINDINGS: No active infiltrate  or effusion is seen. Mediastinal and hilar contours are unremarkable. The heart is mildly enlarged and stable. No acute bony abnormality is seen. IMPRESSION: Stable mild cardiomegaly.  No active lung disease. Electronically Signed   By: Dwyane Dee M.D.   On: 10/26/2017 13:12        Scheduled Meds: . insulin aspart  0-15 Units Subcutaneous TID WC  . insulin aspart  0-5 Units Subcutaneous QHS  . insulin glargine  10 Units Subcutaneous BID  . simvastatin  40 mg Oral QPM  . sodium chloride flush  3 mL Intravenous Q12H  . Warfarin - Pharmacist Dosing Inpatient   Does not apply q1800   Continuous Infusions: . sodium chloride 125 mL/hr at 10/27/17 0700  . ampicillin (OMNIPEN) IV  LOS: 1 day    Time spent: 35    Delaine Lame, MD [facility]  If 7PM-7AM, please contact night-coverage [email or web address] Password [email or web address] 10/27/2017, 10:16 AM

## 2017-10-27 NOTE — Consult Note (Signed)
[facility] for Infectious Disease    Date of Admission:  10/26/2017   Total days of antibiotics: 1 vanco-cefepime --> amp               Reason for Consult: Enterococcal bacteremia    Referring Provider: CHAMP   Assessment: Enterococcal bacteremia DM2 with hyperglycemia, acidosis UPJ stent AKI (improving)  Plan: 1. Continue ampicillin 2. Check TEE 3. Consider uro f/u for potential removal of stent.  4. Repeat BCx in AM 5. Glc managment, fluid as tolerated  Comment-  My great appreciation to pharmacy for their assistance with this pt.   Thank you so much for this interesting consult,  Principal Problem:   Severe sepsis with acute organ dysfunction ([place]) Active Problems:   HTN (hypertension)   DM II (diabetes mellitus, type II), controlled ([place])   Closed compression fracture of body of lumbar vertebra (HCC)   AF (atrial fibrillation) ([place])   Hyperlipidemia   History of stroke   Presence of inferior vena cava filter   Right ureteral stone   Sepsis ([place])   ARF (acute renal failure) ([place])   Metabolic acidosis   Acute pyelonephritis   Acute lower UTI   Status post placement of ureteral stent:R 10/15/2017   Dehydration   . insulin aspart  0-15 Units Subcutaneous TID WC  . insulin aspart  0-5 Units Subcutaneous QHS  . insulin glargine  10 Units Subcutaneous BID  . simvastatin  40 mg Oral QPM  . sodium chloride flush  3 mL Intravenous Q12H  . Warfarin - Pharmacist Dosing Inpatient   Does not apply q1800    HPI: [name] is a 77 y.o. male with hx of DM2, nephrolithiasis who required R UPJ stent on 10-16-17 due to hydronephrosis.  He returned to hospital on 10-22 with worsening weakness and subjective fever since his stent. He has had worsening R flank pain as well. In ED his UA was notable for pyuria, Cr 1.71 and Glc 415, CO2 16. He was tachycardic, tachypneic and hypertensive. He had temp 102.8.  He was started on vanco/cefepime.  He is now  found to have 2/2 BCx+ enterococcus (non VRE).  His anbx have been changed to amp.  CT abdomen shows: 1. Interval placement of RIGHT nephroureteral stent with decompressed collecting system. New RIGHT perinephric fat stranding, potential urinary tract infection. 2. Bilateral nonobstructing nephrolithiasis. 3. Cirrhosis and sequelae of portal hypertension without ascites. 4. 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  Review of Systems: Review of Systems  Constitutional: Positive for fever. Negative for chills.  Eyes: Negative for blurred vision.  Gastrointestinal: Positive for abdominal pain. Negative for constipation and diarrhea.  Genitourinary: Positive for flank pain. Negative for dysuria.  Neurological: Negative for sensory change.  Please see HPI. All other systems reviewed and negative.   Past Medical History:  Diagnosis Date  . Cirrhosis ([place])    noted on CT 05/28/16  . Closed compression fracture of L1 lumbar vertebra 05/2016  . Diabetes mellitus   . Diverticulitis   . DVT (deep venous thrombosis) ([place]) 2002  . Dyspnea    with exertion  . History of kidney stones   . Hyperlipidemia   . Hypertension   . Insomnia   . Kidney stones    hx of  . Liver mass, right lobe    noted on CT 05/28/16  . Pancreatic cyst    noted on CT 05/28/16  . Perforated diverticulum   .  Presence of inferior vena cava filter 2002  . Pulmonary embolism ([place]) 2002  . Secondary hypercoagulability disorder ([place])   . Status post placement of ureteral stent:R 10/15/2017 10/26/2017  . Stroke [facility]) 2002   right eye no peripheral vision, short term memory loss    Social History   Tobacco Use  . Smoking status: Never Smoker  . Smokeless tobacco: Never Used  Substance Use Topics  . Alcohol use: No  . Drug use: No    Family History  Problem Relation Age of Onset  . Lung disease Father   . Cancer Neg Hx      Medications:  Scheduled: . insulin aspart  0-15 Units Subcutaneous TID WC  .  insulin aspart  0-5 Units Subcutaneous QHS  . insulin glargine  10 Units Subcutaneous BID  . simvastatin  40 mg Oral QPM  . sodium chloride flush  3 mL Intravenous Q12H  . Warfarin - Pharmacist Dosing Inpatient   Does not apply q1800    Abtx:  Anti-infectives (From admission, onward)   Start     Dose/Rate Route Frequency Ordered Stop   10/28/17 1200  vancomycin (VANCOCIN) 1,500 mg in sodium chloride 0.9 % 500 mL IVPB  Status:  Discontinued     1,500 mg 250 mL/hr over 120 Minutes Intravenous Every 48 hours 10/26/17 1705 10/27/17 0926   10/27/17 1200  ampicillin (OMNIPEN) 2 g in sodium chloride 0.9 % 100 mL IVPB     2 g 300 mL/hr over 20 Minutes Intravenous Every 6 hours 10/27/17 0928     10/27/17 1000  ceFEPIme (MAXIPIME) 2 g in sodium chloride 0.9 % 100 mL IVPB  Status:  Discontinued     2 g 200 mL/hr over 30 Minutes Intravenous Every 24 hours 10/26/17 1705 10/27/17 0926   10/26/17 1645  ceFEPIme (MAXIPIME) 2 g in sodium chloride 0.9 % 100 mL IVPB  Status:  Discontinued     2 g 200 mL/hr over 30 Minutes Intravenous  Once 10/26/17 1637 10/26/17 1642   10/26/17 1645  vancomycin (VANCOCIN) IVPB 1000 mg/200 mL premix  Status:  Discontinued     1,000 mg 200 mL/hr over 60 Minutes Intravenous  Once 10/26/17 1637 10/26/17 1642   10/26/17 1230  ceFEPIme (MAXIPIME) 2 g in sodium chloride 0.9 % 100 mL IVPB     2 g 200 mL/hr over 30 Minutes Intravenous  Once 10/26/17 1216 10/26/17 1316   10/26/17 1230  vancomycin (VANCOCIN) 1,500 mg in sodium chloride 0.9 % 500 mL IVPB     1,500 mg 250 mL/hr over 120 Minutes Intravenous  Once 10/26/17 1216 10/26/17 1607        OBJECTIVE: Blood pressure (!) 166/80, pulse 99, temperature 98.7 F (37.1 C), temperature source Oral, resp. rate (!) 28, height _0  (1.626 m), weight 75.9 kg, SpO2 93 %.  Physical Exam  Constitutional: He appears well-developed and well-nourished.  HENT:  Mouth/Throat: No oropharyngeal exudate.  Eyes: Pupils are equal, round,  and reactive to light. EOM are normal.  Neck: Normal range of motion. Neck supple.  Cardiovascular: Normal rate, regular rhythm and normal heart sounds.  Pulmonary/Chest: Effort normal and breath sounds normal.  Abdominal: Soft. Bowel sounds are normal. He exhibits distension. There is tenderness. There is no rebound and no guarding.  Musculoskeletal: He exhibits no edema.  Lymphadenopathy:    He has no cervical adenopathy.  Neurological: He is alert.  no diabetic foot lesions. Grossly normal light touch BLE.   Lab Results Results  for orders placed or performed during the hospital encounter of 10/26/17 (from the past 48 hour(s))  CBG monitoring, ED     Status: Abnormal   Collection Time: 10/26/17 11:39 AM  Result Value Ref Range   Glucose-Capillary 400 (H) 70 - 99 mg/dL  Basic metabolic panel     Status: Abnormal   Collection Time: 10/26/17 12:01 PM  Result Value Ref Range   Sodium 134 (L) 135 - 145 mmol/L   Potassium 4.3 3.5 - 5.1 mmol/L   Chloride 101 98 - 111 mmol/L   CO2 16 (L) 22 - 32 mmol/L   Glucose, Bld 415 (H) 70 - 99 mg/dL   BUN 34 (H) 8 - 23 mg/dL   Creatinine, Ser 1.71 (H) 0.61 - 1.24 mg/dL   Calcium 8.8 (L) 8.9 - 10.3 mg/dL   GFR calc non Af Amer 37 (L) >60 mL/min   GFR calc Af Amer 43 (L) >60 mL/min    Comment: (NOTE) The eGFR has been calculated using the CKD EPI equation. This calculation has not been validated in all clinical situations. eGFR's persistently <60 mL/min signify possible Chronic Kidney Disease.    Anion gap 17 (H) 5 - 15    Comment: Performed at [facility], [place] [address] [place], [place] [address]  CBC     Status: Abnormal   Collection Time: 10/26/17 12:01 PM  Result Value Ref Range   WBC 23.6 (H) 4.0 - 10.5 K/uL   RBC 4.83 4.22 - 5.81 MIL/uL   Hemoglobin 14.9 13.0 - 17.0 g/dL   HCT 45.8 39.0 - 52.0 %   MCV 94.8 80.0 - 100.0 fL   MCH 30.8 26.0 - 34.0 pg   MCHC 32.5 30.0 - 36.0 g/dL   RDW 14.2 11.5 - 15.5 %    Platelets 155 150 - 400 K/uL   nRBC 0.0 0.0 - 0.2 %    Comment: Performed at [facility], [place] [address], [place], [place] [address]  Hepatic function panel     Status: Abnormal   Collection Time: 10/26/17 12:01 PM  Result Value Ref Range   Total Protein 7.2 6.5 - 8.1 g/dL   Albumin 3.7 3.5 - 5.0 g/dL   AST 97 (H) 15 - 41 U/L   ALT 41 0 - 44 U/L   Alkaline Phosphatase 60 38 - 126 U/L   Total Bilirubin 1.2 0.3 - 1.2 mg/dL   Bilirubin, Direct 0.4 (H) 0.0 - 0.2 mg/dL   Indirect Bilirubin 0.8 0.3 - 0.9 mg/dL    Comment: Performed at [facility], [place] [address], [place], [place] [address]  Lipase, blood     Status: None   Collection Time: 10/26/17 12:01 PM  Result Value Ref Range   Lipase 21 11 - 51 U/L    Comment: Performed at [facility], [place] [address], [place], [place] [address]  I-Stat CG4 Lactic Acid, ED     Status: Abnormal   Collection Time: 10/26/17 12:23 PM  Result Value Ref Range   Lactic Acid, Venous 7.15 (HH) 0.5 - 1.9 mmol/L   Comment NOTIFIED PHYSICIAN   Blood culture (routine x 2)     Status: None (Preliminary result)   Collection Time: 10/26/17 12:34 PM  Result Value Ref Range   Specimen Description      BLOOD RIGHT ARM Performed at [facility] [address], [place], [place] [address]    Special Requests  BOTTLES DRAWN AEROBIC AND ANAEROBIC Blood Culture adequate volume Performed at [facility] [address], [place], [place] [address]    Culture  Setup Time      GRAM POSITIVE COCCI IN BOTH AEROBIC AND ANAEROBIC BOTTLES Organism ID to follow CRITICAL RESULT CALLED TO, READ BACK BY AND VERIFIED WITH: PHARMD J LEGGE 10/27/17 AT 908 AM BY CM Performed at [facility], [place] [address] [place], [place] [address]    Culture GRAM POSITIVE COCCI    Report Status PENDING   Blood Culture ID Panel (Reflexed)     Status: Abnormal   Collection Time: 10/26/17  12:34 PM  Result Value Ref Range   Enterococcus species DETECTED (A) NOT DETECTED    Comment: CRITICAL RESULT CALLED TO, READ BACK BY AND VERIFIED WITH: PHARMD J LEGGE 10/27/17 AT 908 AM BY CM    Vancomycin resistance NOT DETECTED NOT DETECTED   Listeria monocytogenes NOT DETECTED NOT DETECTED   Staphylococcus species NOT DETECTED NOT DETECTED   Staphylococcus aureus (BCID) NOT DETECTED NOT DETECTED   Streptococcus species NOT DETECTED NOT DETECTED   Streptococcus agalactiae NOT DETECTED NOT DETECTED   Streptococcus pneumoniae NOT DETECTED NOT DETECTED   Streptococcus pyogenes NOT DETECTED NOT DETECTED   Acinetobacter baumannii NOT DETECTED NOT DETECTED   Enterobacteriaceae species NOT DETECTED NOT DETECTED   Enterobacter cloacae complex NOT DETECTED NOT DETECTED   Escherichia coli NOT DETECTED NOT DETECTED   Klebsiella oxytoca NOT DETECTED NOT DETECTED   Klebsiella pneumoniae NOT DETECTED NOT DETECTED   Proteus species NOT DETECTED NOT DETECTED   Serratia marcescens NOT DETECTED NOT DETECTED   Haemophilus influenzae NOT DETECTED NOT DETECTED   Neisseria meningitidis NOT DETECTED NOT DETECTED   Pseudomonas aeruginosa NOT DETECTED NOT DETECTED   Candida albicans NOT DETECTED NOT DETECTED   Candida glabrata NOT DETECTED NOT DETECTED   Candida krusei NOT DETECTED NOT DETECTED   Candida parapsilosis NOT DETECTED NOT DETECTED   Candida tropicalis NOT DETECTED NOT DETECTED    Comment: Performed at [facility] [address] [place], [place] [address]  Blood culture (routine x 2)     Status: None (Preliminary result)   Collection Time: 10/26/17 12:45 PM  Result Value Ref Range   Specimen Description      BLOOD LEFT ARM Performed at [facility] [address] [place], [place] [address]    Special Requests      BOTTLES DRAWN AEROBIC AND ANAEROBIC Blood Culture results may not be optimal due to an excessive volume of blood received in culture  bottles Performed at [facility] [address] [place], [place] [address]    Culture      NO GROWTH < 24 HOURS Performed at [facility] [address] [place], [place] [address]    Report Status PENDING   Protime-INR     Status: Abnormal   Collection Time: 10/26/17 12:45 PM  Result Value Ref Range   Prothrombin Time 36.6 (H) 11.4 - 15.2 seconds   INR 3.76     Comment: Performed at [facility], [place] [address], [place], [place] [address]  Urinalysis, Routine w reflex microscopic     Status: Abnormal   Collection Time: 10/26/17 12:59 PM  Result Value Ref Range   Color, Urine AMBER (A) YELLOW    Comment: BIOCHEMICALS MAY BE AFFECTED BY COLOR   APPearance CLOUDY (A) CLEAR   Specific Gravity, Urine 1.020 1.005 -  1.030   pH 6.0 5.0 - 8.0   Glucose, UA >=500 (A) NEGATIVE mg/dL   Hgb urine dipstick MODERATE (A) NEGATIVE   Bilirubin Urine NEGATIVE NEGATIVE   Ketones, ur 5 (A) NEGATIVE mg/dL   Protein, ur 100 (A) NEGATIVE mg/dL   Nitrite NEGATIVE NEGATIVE   Leukocytes, UA SMALL (A) NEGATIVE   RBC / HPF >50 (H) 0 - 5 RBC/hpf   WBC, UA 21-50 0 - 5 WBC/hpf   Bacteria, UA FEW (A) NONE SEEN    Comment: Performed at [facility], [place] [address] [place], [place] [address]  Sodium, urine, random     Status: None   Collection Time: 10/26/17 12:59 PM  Result Value Ref Range   Sodium, Ur 30 mmol/L    Comment: Performed at [facility], [place] [address], [place], [place] [address]  Creatinine, urine, random     Status: None   Collection Time: 10/26/17 12:59 PM  Result Value Ref Range   Creatinine, Urine 101.67 mg/dL    Comment: Performed at [facility], [place] [address], [place], [place] [address]  I-Stat CG4 Lactic Acid, ED     Status: Abnormal   Collection Time: 10/26/17  2:20 PM  Result Value Ref Range   Lactic Acid, Venous 6.73 (HH) 0.5 - 1.9 mmol/L   Comment NOTIFIED PHYSICIAN    CBG monitoring, ED     Status: Abnormal   Collection Time: 10/26/17  2:41 PM  Result Value Ref Range   Glucose-Capillary 324 (H) 70 - 99 mg/dL  Type and screen [place]     Status: None   Collection Time: 10/26/17  5:36 PM  Result Value Ref Range   ABO/RH(D) O POS    Antibody Screen NEG    Sample Expiration      10/29/2017 Performed at [facility], [place] [address], [place], [place] [address]   MRSA PCR Screening     Status: None   Collection Time: 10/26/17  5:44 PM  Result Value Ref Range   MRSA by PCR NEGATIVE NEGATIVE    Comment:        The GeneXpert MRSA Assay (FDA approved for NASAL specimens only), is one component of a comprehensive MRSA colonization surveillance program. It is not intended to diagnose MRSA infection nor to guide or monitor treatment for MRSA infections. Performed at [facility], [place] [address] [place],  [address]   Hemoglobin A1c     Status: Abnormal   Collection Time: 10/26/17  5:46 PM  Result Value Ref Range   Hgb A1c MFr Bld 8.4 (H) 4.8 - 5.6 %    Comment: (NOTE)         Prediabetes: 5.7 - 6.4         Diabetes: >6.4         Glycemic control for adults with diabetes: <7.0    Mean Plasma Glucose 194 mg/dL    Comment: (NOTE) Performed At: [facility] [place], [place] [id number] Rush Farmer MD [id number]   Basic metabolic panel     Status: Abnormal   Collection Time: 10/26/17  5:46 PM  Result Value Ref Range   Sodium 135 135 - 145 mmol/L   Potassium 4.1 3.5 - 5.1 mmol/L   Chloride 105 98 - 111 mmol/L   CO2 20 (L) 22 - 32 mmol/L   Glucose, Bld 327 (H) 70 - 99 mg/dL   BUN 32 (H) 8 -  23 mg/dL   Creatinine, Ser 1.34 (H) 0.61 - 1.24 mg/dL   Calcium 8.3 (L) 8.9 - 10.3 mg/dL   GFR calc non Af Amer 50 (L) >60 mL/min   GFR calc Af Amer 58 (L) >60 mL/min    Comment: (NOTE) The eGFR has been calculated using the CKD EPI equation. This calculation  has not been validated in all clinical situations. eGFR's persistently <60 mL/min signify possible Chronic Kidney Disease.    Anion gap 10 5 - 15    Comment: Performed at [facility], [place] [address], [place], [place] [address]  Magnesium     Status: None   Collection Time: 10/26/17  5:46 PM  Result Value Ref Range   Magnesium 1.8 1.7 - 2.4 mg/dL    Comment: Performed at [facility], [place] [address], [place], [place] [address]  Procalcitonin - Baseline     Status: None   Collection Time: 10/26/17  5:46 PM  Result Value Ref Range   Procalcitonin 1.61 ng/mL    Comment:        Interpretation: PCT > 0.5 ng/mL and <= 2 ng/mL: Systemic infection (sepsis) is possible, but other conditions are known to elevate PCT as well. (NOTE)       Sepsis PCT Algorithm           Lower Respiratory Tract                                      Infection PCT Algorithm    ----------------------------     ----------------------------         PCT < 0.25 ng/mL                PCT < 0.10 ng/mL         Strongly encourage             Strongly discourage   discontinuation of antibiotics    initiation of antibiotics    ----------------------------     -----------------------------       PCT 0.25 - 0.50 ng/mL            PCT 0.10 - 0.25 ng/mL               OR       >80% decrease in PCT            Discourage initiation of                                            antibiotics      Encourage discontinuation           of antibiotics    ----------------------------     -----------------------------         PCT >= 0.50 ng/mL              PCT 0.26 - 0.50 ng/mL                AND       <80% decrease in PCT             Encourage initiation of  antibiotics       Encourage continuation           of antibiotics    ----------------------------     -----------------------------        PCT >= 0.50 ng/mL                  PCT > 0.50 ng/mL                AND         increase in PCT                  Strongly encourage                                      initiation of antibiotics    Strongly encourage escalation           of antibiotics                                     -----------------------------                                           PCT <= 0.25 ng/mL                                                 OR                                        > 80% decrease in PCT                                     Discontinue / Do not initiate                                             antibiotics Performed at [place] [address] [place], [place] [address]   APTT     Status: None   Collection Time: 10/26/17  5:46 PM  Result Value Ref Range   aPTT 33 24 - 36 seconds    Comment: Performed at [facility], [place] [address], [place], [place] [address]  Glucose, capillary     Status: Abnormal   Collection Time: 10/26/17  9:38 PM  Result Value Ref Range   Glucose-Capillary 236 (H) 70 - 99 mg/dL   Comment 1 Notify RN    Comment 2 Document in Chart   Procalcitonin     Status: None   Collection Time: 10/27/17  5:54 AM  Result Value Ref Range   Procalcitonin 1.95 ng/mL    Comment:        Interpretation: PCT > 0.5 ng/mL and <= 2 ng/mL: Systemic infection (sepsis) is possible, but other conditions are known to elevate PCT as well. (NOTE)  Sepsis PCT Algorithm           Lower Respiratory Tract                                      Infection PCT Algorithm    ----------------------------     ----------------------------         PCT < 0.25 ng/mL                PCT < 0.10 ng/mL         Strongly encourage             Strongly discourage   discontinuation of antibiotics    initiation of antibiotics    ----------------------------     -----------------------------       PCT 0.25 - 0.50 ng/mL            PCT 0.10 - 0.25 ng/mL               OR       >80% decrease in PCT            Discourage  initiation of                                            antibiotics      Encourage discontinuation           of antibiotics    ----------------------------     -----------------------------         PCT >= 0.50 ng/mL              PCT 0.26 - 0.50 ng/mL                AND       <80% decrease in PCT             Encourage initiation of                                             antibiotics       Encourage continuation           of antibiotics    ----------------------------     -----------------------------        PCT >= 0.50 ng/mL                  PCT > 0.50 ng/mL               AND         increase in PCT                  Strongly encourage                                      initiation of antibiotics    Strongly encourage escalation           of antibiotics                                     -----------------------------  PCT <= 0.25 ng/mL                                                 OR                                        > 80% decrease in PCT                                     Discontinue / Do not initiate                                             antibiotics Performed at [facility] [address] [place], [place] [address]   Lactic acid, plasma     Status: Abnormal   Collection Time: 10/27/17  5:54 AM  Result Value Ref Range   Lactic Acid, Venous 2.5 (HH) 0.5 - 1.9 mmol/L    Comment: CRITICAL RESULT CALLED TO, READ BACK BY AND VERIFIED WITH: C.MCMILLAN RN AT 0715 ON 10/27/17 BY S.VANHOORNE Performed at [facility], [place] [address] [place], [place] [address]   Protime-INR     Status: Abnormal   Collection Time: 10/27/17  5:54 AM  Result Value Ref Range   Prothrombin Time 44.6 (H) 11.4 - 15.2 seconds   INR 4.86 (HH)     Comment: CRITICAL RESULT CALLED TO, READ BACK BY AND VERIFIED WITH: MCMILLAN,S @ 3790 ON [id number] BY MCCOY,N Performed at [facility]  [address] [place], [place] [address]   Basic metabolic panel     Status: Abnormal   Collection Time: 10/27/17  5:54 AM  Result Value Ref Range   Sodium 136 135 - 145 mmol/L   Potassium 4.7 3.5 - 5.1 mmol/L   Chloride 104 98 - 111 mmol/L   CO2 21 (L) 22 - 32 mmol/L   Glucose, Bld 317 (H) 70 - 99 mg/dL   BUN 34 (H) 8 - 23 mg/dL   Creatinine, Ser 1.27 (H) 0.61 - 1.24 mg/dL   Calcium 7.9 (L) 8.9 - 10.3 mg/dL   GFR calc non Af Amer 53 (L) >60 mL/min   GFR calc Af Amer >60 >60 mL/min    Comment: (NOTE) The eGFR has been calculated using the CKD EPI equation. This calculation has not been validated in all clinical situations. eGFR's persistently <60 mL/min signify possible Chronic Kidney Disease.    Anion gap 11 5 - 15    Comment: Performed at [facility], [place] [address] [place],  [address]  CBC     Status: Abnormal   Collection Time: 10/27/17  5:54 AM  Result Value Ref Range   WBC 17.7 (H) 4.0 - 10.5 K/uL   RBC 4.68 4.22 - 5.81 MIL/uL   Hemoglobin 14.1 13.0 - 17.0 g/dL   HCT 43.5 39.0 - 52.0 %   MCV 92.9 80.0 - 100.0 fL   MCH 30.1 26.0 - 34.0 pg   MCHC 32.4 30.0 - 36.0 g/dL   RDW 14.1 11.5 - 15.5 %  Platelets 118 (L) 150 - 400 K/uL    Comment: REPEATED TO VERIFY PLATELET COUNT CONFIRMED BY SMEAR SPECIMEN CHECKED FOR CLOTS    nRBC 0.0 0.0 - 0.2 %    Comment: Performed at [facility], [place] [address] [place], [place] [address]  Glucose, capillary     Status: Abnormal   Collection Time: 10/27/17  7:46 AM  Result Value Ref Range   Glucose-Capillary 325 (H) 70 - 99 mg/dL  Glucose, capillary     Status: Abnormal   Collection Time: 10/27/17 11:13 AM  Result Value Ref Range   Glucose-Capillary 315 (H) 70 - 99 mg/dL      Component Value Date/Time   SDES  10/26/2017 1245    BLOOD LEFT ARM Performed at [facility], [place] [address], [place], [place] [address]    SPECREQUEST  10/26/2017 1245    BOTTLES DRAWN  AEROBIC AND ANAEROBIC Blood Culture results may not be optimal due to an excessive volume of blood received in culture bottles Performed at [facility], [place] [address], [place], [place] [address]    CULT  10/26/2017 1245    NO GROWTH < 24 HOURS Performed at [facility] [address], [place], [place] [address]    REPTSTATUS PENDING 10/26/2017 1245   Ct Abdomen Pelvis Wo Contrast  Result Date: 10/26/2017 CLINICAL DATA:  Found down. On pain medication for kidney stones. Recent ureteral stent. Fever and hyperglycemia. History of diabetes, RIGHT stent placement October 15, 2017, kidney stones, pancreatic cyst, liver mass, cirrhosis, diverticulitis. EXAM: CT ABDOMEN AND PELVIS WITHOUT CONTRAST TECHNIQUE: Multidetector CT imaging of the abdomen and pelvis was performed following the standard protocol without IV contrast. COMPARISON:  CT abdomen and pelvis October 15, 2017 and MRI abdomen May 29, 2016 FINDINGS: LOWER CHEST: Lung bases are clear. The visualized heart size is normal. Advanced coronary artery calcifications, incompletely evaluated. No pericardial effusion. HEPATOBILIARY: Nodular cirrhotic liver with enlarged LEFT lobe and caudate lobes. Subtle hypodense mass RIGHT lobe of the liver less conspicuous than prior imaging, likely technical. Negative non-contrast CT gallbladder. PANCREAS: Nonacute. 15 mm complex mass tail of pancreas, previously evaluated with MRI. Punctate calcifications and atrophy. SPLEEN: Mild splenomegaly. ADRENALS/URINARY TRACT: Kidneys are orthotopic, demonstrating normal size. New RIGHT nephroureteral stent with proximal retaining loop within renal pelvis. Increasing RIGHT perinephric fat stranding. 8 mm calculus renal pelvis remains. No hydronephrosis. 11 mm RIGHT lower pole nephrolithiasis. 12 mm LEFT lower pole nephrolithiasis, additional smaller LEFT nephrolithiasis. Stable exophytic 12 mm calcified LEFT renal mass. 3.4 cm cyst lower pole LEFT  kidney. Limited assessment for renal masses by nonenhanced CT. Urinary bladder is well distended containing distal retaining loop. Normal adrenal glands. STOMACH/BOWEL: The stomach, small and large bowel are normal in course and caliber without inflammatory changes, sensitivity decreased by lack of enteric contrast. Small volume retained large bowel stool. A few scattered colonic diverticula noted. VASCULAR/LYMPHATIC: Aortoiliac vessels are normal in course and caliber. Mild calcific atherosclerosis. Inferior vena cava filter below the level the renal veins. Paraesophageal varicosities. No lymphadenopathy by CT size criteria. REPRODUCTIVE: Mild prostatomegaly. OTHER: No intraperitoneal free fluid or free air. Status post anterior abdominal wall herniorrhaphy. MUSCULOSKELETAL: Non-acute. Linear sclerosis RIGHT femoral head equivocal for avascular necrosis without collapse. Osteopenia. Old moderate to severe L1 compression fracture and mild L5 compression fracture. Scattered old Schmorl's nodes. IMPRESSION: 1. Interval placement of RIGHT nephroureteral stent with decompressed collecting system. New RIGHT perinephric fat stranding, potential urinary tract infection. 2.  Bilateral nonobstructing nephrolithiasis. 3. Cirrhosis and sequelae of portal hypertension without ascites. 4. 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 Aortic Atherosclerosis (ICD10-I70.0). Electronically Signed   By: Elon Alas M.D.   On: 10/26/2017 14:29   Dg Chest Portable 1 View  Result Date: 10/26/2017 CLINICAL DATA:  Weakness, rapid heart rate EXAM: PORTABLE CHEST 1 VIEW COMPARISON:  Chest x-ray of 05/28/2016 FINDINGS: No active infiltrate or effusion is seen. Mediastinal and hilar contours are unremarkable. The heart is mildly enlarged and stable. No acute bony abnormality is seen. IMPRESSION: Stable mild cardiomegaly.  No active lung disease. Electronically Signed   By: Ivar Drape M.D.   On: 10/26/2017 13:12   Recent Results (from the  past 240 hour(s))  Blood culture (routine x 2)     Status: None (Preliminary result)   Collection Time: 10/26/17 12:34 PM  Result Value Ref Range Status   Specimen Description   Final    BLOOD RIGHT ARM Performed at [facility] [address] [place], [place] [address]    Special Requests   Final    BOTTLES DRAWN AEROBIC AND ANAEROBIC Blood Culture adequate volume Performed at [facility] [address] [place], [place] [address]    Culture  Setup Time   Final    GRAM POSITIVE COCCI IN BOTH AEROBIC AND ANAEROBIC BOTTLES Organism ID to follow CRITICAL RESULT CALLED TO, READ BACK BY AND VERIFIED WITH: PHARMD J LEGGE 10/27/17 AT 908 AM BY CM Performed at [facility], [place] [address] [place], [place] [address]    Culture GRAM POSITIVE COCCI  Final   Report Status PENDING  Incomplete  Blood Culture ID Panel (Reflexed)     Status: Abnormal   Collection Time: 10/26/17 12:34 PM  Result Value Ref Range Status   Enterococcus species DETECTED (A) NOT DETECTED Final    Comment: CRITICAL RESULT CALLED TO, READ BACK BY AND VERIFIED WITH: PHARMD J LEGGE 10/27/17 AT 908 AM BY CM    Vancomycin resistance NOT DETECTED NOT DETECTED Final   Listeria monocytogenes NOT DETECTED NOT DETECTED Final   Staphylococcus species NOT DETECTED NOT DETECTED Final   Staphylococcus aureus (BCID) NOT DETECTED NOT DETECTED Final   Streptococcus species NOT DETECTED NOT DETECTED Final   Streptococcus agalactiae NOT DETECTED NOT DETECTED Final   Streptococcus pneumoniae NOT DETECTED NOT DETECTED Final   Streptococcus pyogenes NOT DETECTED NOT DETECTED Final   Acinetobacter baumannii NOT DETECTED NOT DETECTED Final   Enterobacteriaceae species NOT DETECTED NOT DETECTED Final   Enterobacter cloacae complex NOT DETECTED NOT DETECTED Final   Escherichia coli NOT DETECTED NOT DETECTED Final   Klebsiella oxytoca NOT DETECTED NOT DETECTED Final   Klebsiella pneumoniae  NOT DETECTED NOT DETECTED Final   Proteus species NOT DETECTED NOT DETECTED Final   Serratia marcescens NOT DETECTED NOT DETECTED Final   Haemophilus influenzae NOT DETECTED NOT DETECTED Final   Neisseria meningitidis NOT DETECTED NOT DETECTED Final   Pseudomonas aeruginosa NOT DETECTED NOT DETECTED Final   Candida albicans NOT DETECTED NOT DETECTED Final   Candida glabrata NOT DETECTED NOT DETECTED Final   Candida krusei NOT DETECTED NOT DETECTED Final   Candida parapsilosis NOT DETECTED NOT DETECTED Final   Candida tropicalis NOT DETECTED NOT DETECTED Final    Comment: Performed at [facility], [address], [place],  [address]  Blood culture (routine x 2)     Status: None (Preliminary result)   Collection Time: 10/26/17 12:45 PM  Result Value Ref Range Status   Specimen Description   Final    BLOOD LEFT ARM Performed at [facility] [address] [place], [place] [address]    Special Requests   Final    BOTTLES DRAWN AEROBIC AND ANAEROBIC Blood Culture results may not be optimal due to an excessive volume of blood received in culture bottles Performed at [facility] [address] [place], [place] [address]    Culture   Final    NO GROWTH < 24 HOURS Performed at [facility] [address] [place], [place] [address]    Report Status PENDING  Incomplete  MRSA PCR Screening     Status: None   Collection Time: 10/26/17  5:44 PM  Result Value Ref Range Status   MRSA by PCR NEGATIVE NEGATIVE Final    Comment:        The GeneXpert MRSA Assay (FDA approved for NASAL specimens only), is one component of a comprehensive MRSA colonization surveillance program. It is not intended to diagnose MRSA infection nor to guide or monitor treatment for MRSA infections. Performed at [facility], [place] [address], [place], [place] [address]     Microbiology: Recent Results (from the past 240 hour(s))   Blood culture (routine x 2)     Status: None (Preliminary result)   Collection Time: 10/26/17 12:34 PM  Result Value Ref Range Status   Specimen Description   Final    BLOOD RIGHT ARM Performed at [facility], [place] [address], [place], [place] [address]    Special Requests   Final    BOTTLES DRAWN AEROBIC AND ANAEROBIC Blood Culture adequate volume Performed at [facility] [address] [place], [place] [address]    Culture  Setup Time   Final    GRAM POSITIVE COCCI IN BOTH AEROBIC AND ANAEROBIC BOTTLES Organism ID to follow CRITICAL RESULT CALLED TO, READ BACK BY AND VERIFIED WITH: PHARMD J LEGGE 10/27/17 AT 908 AM BY CM Performed at [facility], [place] [address] [place], [place] [address]    Culture GRAM POSITIVE COCCI  Final   Report Status PENDING  Incomplete  Blood Culture ID Panel (Reflexed)     Status: Abnormal   Collection Time: 10/26/17 12:34 PM  Result Value Ref Range Status   Enterococcus species DETECTED (A) NOT DETECTED Final    Comment: CRITICAL RESULT CALLED TO, READ BACK BY AND VERIFIED WITH: PHARMD J LEGGE 10/27/17 AT 908 AM BY CM    Vancomycin resistance NOT DETECTED NOT DETECTED Final   Listeria monocytogenes NOT DETECTED NOT DETECTED Final   Staphylococcus species NOT DETECTED NOT DETECTED Final   Staphylococcus aureus (BCID) NOT DETECTED NOT DETECTED Final   Streptococcus species NOT DETECTED NOT DETECTED Final   Streptococcus agalactiae NOT DETECTED NOT DETECTED Final   Streptococcus pneumoniae NOT DETECTED NOT DETECTED Final   Streptococcus pyogenes NOT DETECTED NOT DETECTED Final   Acinetobacter baumannii NOT DETECTED NOT DETECTED Final   Enterobacteriaceae species NOT DETECTED NOT DETECTED Final   Enterobacter cloacae complex NOT DETECTED NOT DETECTED Final   Escherichia coli NOT DETECTED NOT DETECTED Final   Klebsiella oxytoca NOT DETECTED NOT DETECTED Final   Klebsiella pneumoniae NOT DETECTED NOT  DETECTED Final   Proteus species NOT DETECTED NOT DETECTED Final   Serratia marcescens NOT DETECTED NOT DETECTED Final   Haemophilus influenzae NOT DETECTED NOT DETECTED Final   Neisseria meningitidis NOT DETECTED  NOT DETECTED Final   Pseudomonas aeruginosa NOT DETECTED NOT DETECTED Final   Candida albicans NOT DETECTED NOT DETECTED Final   Candida glabrata NOT DETECTED NOT DETECTED Final   Candida krusei NOT DETECTED NOT DETECTED Final   Candida parapsilosis NOT DETECTED NOT DETECTED Final   Candida tropicalis NOT DETECTED NOT DETECTED Final    Comment: Performed at [facility], [place] [address] [place], [place] [address]  Blood culture (routine x 2)     Status: None (Preliminary result)   Collection Time: 10/26/17 12:45 PM  Result Value Ref Range Status   Specimen Description   Final    BLOOD LEFT ARM Performed at [facility] [address], [place], [place] [address]    Special Requests   Final    BOTTLES DRAWN AEROBIC AND ANAEROBIC Blood Culture results may not be optimal due to an excessive volume of blood received in culture bottles Performed at [facility] [address], [place], [place] [address]    Culture   Final    NO GROWTH < 24 HOURS Performed at [facility] [address] [place], [place] [address]    Report Status PENDING  Incomplete  MRSA PCR Screening     Status: None   Collection Time: 10/26/17  5:44 PM  Result Value Ref Range Status   MRSA by PCR NEGATIVE NEGATIVE Final    Comment:        The GeneXpert MRSA Assay (FDA approved for NASAL specimens only), is one component of a comprehensive MRSA colonization surveillance program. It is not intended to diagnose MRSA infection nor to guide or monitor treatment for MRSA infections. Performed at [facility], [place] [address], [place], [place] [address]     Radiographs and labs were personally reviewed by me.   Bobby Rumpf, MD  [facility] [phone] 10/27/2017, 12:11 PM

## 2017-10-28 LAB — CBC
HCT: 40.5 % (ref 39.0–52.0)
Hemoglobin: 13.4 g/dL (ref 13.0–17.0)
MCH: 30.6 pg (ref 26.0–34.0)
MCHC: 33.1 g/dL (ref 30.0–36.0)
MCV: 92.5 fL (ref 80.0–100.0)
Platelets: 132 K/uL — ABNORMAL LOW (ref 150–400)
RBC: 4.38 MIL/uL (ref 4.22–5.81)
RDW: 14 % (ref 11.5–15.5)
WBC: 12.7 10*3/uL — ABNORMAL HIGH (ref 4.0–10.5)
nRBC: 0 % (ref 0.0–0.2)

## 2017-10-28 LAB — GLUCOSE, CAPILLARY
GLUCOSE-CAPILLARY: 204 mg/dL — AB (ref 70–99)
GLUCOSE-CAPILLARY: 198 mg/dL — AB (ref 70–99)
GLUCOSE-CAPILLARY: 165 mg/dL — AB (ref 70–99)
Glucose-Capillary: 189 mg/dL — ABNORMAL HIGH (ref 70–99)

## 2017-10-28 LAB — PROTIME-INR
INR: 4.96
Prothrombin Time: 45.3 seconds — ABNORMAL HIGH (ref 11.4–15.2)

## 2017-10-28 LAB — COMPREHENSIVE METABOLIC PANEL WITH GFR
Albumin: 2.4 g/dL — ABNORMAL LOW (ref 3.5–5.0)
Anion gap: 9 (ref 5–15)
CO2: 22 mmol/L (ref 22–32)
Calcium: 7.5 mg/dL — ABNORMAL LOW (ref 8.9–10.3)
Chloride: 105 mmol/L (ref 98–111)
GFR calc Af Amer: >60 mL/min (ref >60)
Glucose, Bld: 241 mg/dL — ABNORMAL HIGH (ref 70–99)
Potassium: 3.9 mmol/L (ref 3.5–5.1)
Sodium: 136 mmol/L (ref 135–145)

## 2017-10-28 LAB — COMPREHENSIVE METABOLIC PANEL
ALT: 46 U/L — ABNORMAL HIGH (ref 0–44)
AST: 62 U/L — ABNORMAL HIGH (ref 15–41)
Alkaline Phosphatase: 44 U/L (ref 38–126)
BUN: 27 mg/dL — ABNORMAL HIGH (ref 8–23)
Creatinine, Ser: 1.04 mg/dL (ref 0.61–1.24)
GFR calc non Af Amer: >60 mL/min (ref >60)
Total Bilirubin: 1 mg/dL (ref 0.3–1.2)
Total Protein: 5.6 g/dL — ABNORMAL LOW (ref 6.5–8.1)

## 2017-10-28 LAB — MAGNESIUM: Magnesium: 1.9 mg/dL (ref 1.7–2.4)

## 2017-10-28 LAB — PROCALCITONIN: Procalcitonin: 1.32 ng/mL

## 2017-10-28 MED ORDER — INSULIN GLARGINE 100 UNIT/ML ~~LOC~~ SOLN
15.0000 [IU] | Freq: Two times a day (BID) | SUBCUTANEOUS | Status: DC
  Administered 2017-10-28 – 2017-11-03 (×13): 15 [IU] via SUBCUTANEOUS
  Filled 2017-10-28 (×16): qty 0.15

## 2017-10-28 MED ORDER — SODIUM CHLORIDE 0.9 % IV SOLN
2.0000 g | INTRAVENOUS | Status: DC
  Administered 2017-10-28 – 2017-11-01 (×22): 2 g via INTRAVENOUS
  Filled 2017-10-28 (×13): qty 2
  Filled 2017-10-28: qty 2000
  Filled 2017-10-28 (×4): qty 2
  Filled 2017-10-28: qty 2000
  Filled 2017-10-28: qty 2
  Filled 2017-10-28: qty 2000
  Filled 2017-10-28 (×3): qty 2

## 2017-10-28 NOTE — Progress Notes (Signed)
CRITICAL VALUE ALERT  Critical Value:  INR 4.69  Date & Time Notied:  10/28/17 0640  Provider Notified: NP K. Schorr  Orders Received/Actions taken: Awaiting orders

## 2017-10-28 NOTE — Progress Notes (Signed)
Physical Therapy Treatment Patient Details Name: [name] MRN: [id number] DOB: 10-Jan-1940 Today's Date: 10/28/2017    History of Present Illness [name] is a 77 y.o. male with medical history significant of nephrolithiasis with recent right UPJ calculus status post cystoscopy and right urethral stent placement 10/15/2017, diabetes mellitus, hypertension, history of DVT/PE status post IVC filter, history of atrial fibrillation, history of cirrhosis, pancreatic cyst noted on CT 05/28/2016, right liver mass noted on CT 05/28/2016, history of stroke presenting to the ED 10/22 with history of fall 2 days PTA, admitted  with generalized weakness, fever, d worsening R flank pain , UA was notable for pyuria, was tachycardic, tachypneic and hypertensive    PT Comments    Pt progressing toward PT goals, activity limited today d/t pt fatigue and elevated INR; pt is cooperative but with c/o overall feeling poorly, requires frequent rests; will continue to follow. Continue to recommend SNF but will follow for needs  Follow Up Recommendations  SNF;Supervision/Assistance - 24 hour     Equipment Recommendations  None recommended by PT    Recommendations for Other Services       Precautions / Restrictions Precautions Precautions: Fall Precaution Comments: mobility limited 2* incr INR Restrictions Weight Bearing Restrictions: No    Mobility  Bed Mobility Overal bed mobility: Needs Assistance Bed Mobility: Supine to Sit     Supine to sit: Supervision;Min guard     General bed mobility comments: extra time to move legs and trunk, supervision for safety  Transfers Overall transfer level: Needs assistance Equipment used: Rolling walker (2 wheeled) Transfers: Sit to/from [employer] Sit to Stand: Min guard;Min assist Stand pivot transfers: Min guard       General transfer comment: repeated sit<> stand trials for activity tolerance; min to min guard to rise and  steady, incr time needed  Ambulation/Gait             General Gait Details: deferred d/t INR   Stairs             Wheelchair Mobility    Modified Rankin (Stroke Patients Only)       Balance Overall balance assessment: History of Falls;Needs assistance Sitting-balance support: Feet supported;No upper extremity supported Sitting balance-Leahy Scale: Good     Standing balance support: During functional activity;Bilateral upper extremity supported Standing balance-Leahy Scale: Fair                              Cognition Arousal/Alertness: Awake/alert Behavior During Therapy: WFL for tasks assessed/performed Overall Cognitive Status: Within Functional Limits for tasks assessed                                        Exercises General Exercises - Lower Extremity Hip Flexion/Marching: AROM;Strengthening;10 reps;Both Heel Raises: AROM;Both;10 reps    General Comments        Pertinent Vitals/Pain Pain Assessment: No/denies pain    Home Living                      Prior Function            PT Goals (current goals can now be found in the care plan section) Acute Rehab PT Goals Patient Stated Goal: to get up and go PT Goal Formulation: With patient Time For Goal Achievement: 11/10/17 Potential to  Achieve Goals: Good Progress towards PT goals: Progressing toward goals    Frequency    Min 2X/week      PT Plan Current plan remains appropriate    Co-evaluation              AM-PAC PT "6 Clicks" Daily Activity  Outcome Measure  Difficulty turning over in bed (including adjusting bedclothes, sheets and blankets)?: A Lot Difficulty moving from lying on back to sitting on the side of the bed? : A Lot Difficulty sitting down on and standing up from a chair with arms (e.g., wheelchair, bedside commode, etc,.)?: A Lot Help needed moving to and from a bed to chair (including a wheelchair)?: A Little Help needed  walking in hospital room?: A Little Help needed climbing 3-5 steps with a railing? : A Lot 6 Click Score: 14    End of Session Equipment Utilized During Treatment: Gait belt Activity Tolerance: Patient tolerated treatment well;Patient limited by fatigue Patient left: in chair;with call bell/phone within reach;with chair alarm set Nurse Communication: Mobility status PT Visit Diagnosis: Unsteadiness on feet (R26.81)     Time: 1610-9604 PT Time Calculation (min) (ACUTE ONLY): 18 min  Charges:  $Therapeutic Activity: 8-22 mins                     Drucilla Chalet, PT  Pager: [phone] Acute Rehab Dept [facility]): [phone]   10/28/2017    [facility] 10/28/2017, 10:06 AM

## 2017-10-28 NOTE — Progress Notes (Signed)
PROGRESS NOTE    [name]  MVH:[id number] DOB: 06-Jun-1940 DOA: 10/26/2017 PCP: [name], MD   Brief Narrative:  77 year old with past medical history relevant for DVT/PE status post IVC filter placement, paroxysmal atrial fibrillation on warfarin, hypertension, type 2 diabetes not on insulin, hyperlipidemia, nephrolithiasis status post right UPJ stent placement on 10/16/2017 for hydronephrosis , cirrhosis, pancreatic cyst (last MRI of abdomen on 05/29/2016 favors benign, repeat in 2 years), indeterminate liver lesion (MRI of abdomen on 05/29/2016 recommends repeat MRI in 3 months) who presents with profound weakness, sepsis, AKI found to have right-sided pyelonephritis with enterococcus bacteremia.   Assessment & Plan:   Principal Problem:   Severe sepsis with acute organ dysfunction (HCC) Active Problems:   HTN (hypertension)   DM II (diabetes mellitus, type II), controlled (HCC)   Closed compression fracture of body of lumbar vertebra (HCC)   AF (atrial fibrillation) (HCC)   Hyperlipidemia   History of stroke   Presence of inferior vena cava filter   Right ureteral stone   Sepsis (HCC)   ARF (acute renal failure) (HCC)   Metabolic acidosis   Acute pyelonephritis   Acute lower UTI   Status post placement of ureteral stent:R 10/15/2017   Dehydration   #) Enterococcus sepsis due to pyelonephritis: CT abdomen pelvis on admission showed evidence of pyelonephritis on the right.  Intestinal enough this is where he has had his stent placed approximately 10 days ago however there is no evidence of malposition of the stent. -Continue IV ampicillin started 10/27/2017 - blood cultures on 10/27/2017 no growth to date - Blood cultures from 10/26/2017 growing enterococcus -Echo on 10/27/2017 showed only grade 1 diastolic dysfunction  #) Nephrolithiasis status post right UPJ stent on 10/16/2017: Repeat CT imaging on admission shows that renal pelvis is decompressed. -Discussed  with urology, they recommend treating infection and then they will as an outpatient continue treating stones  #) AKI: Likely secondary to sepsis.  Improving -Hold nephrotoxins  #) DVT/PE/atrial fibrillation: Patient is mildly supratherapeutic INR, will hold -Continue warfarin, pharmacy consult  #) Type 2 diabetes: Patient is only on home oral hypoglycemics however his blood sugars here been quite elevated likely in the setting of sepsis. - Increase glargine to 15 units twice daily -Hold home glimepiride 4 mg every morning -Hold home glipizide 2.5 mg daily -Hold home metformin 500 mg twice daily  #) Cirrhosis/indeterminate liver lesion: Per MRI done on 05/29/2016 patient was noted to have cirrhosis and indeterminate liver lesion with recommendation for repeat MRI in 3 months.  This was not performed. -We will order repeat MRI of the abdomen to evaluate liver lesion once creatinine downtrends  #) Hypertension/hyperlipidemia: -Hold home losartan 100 mg every morning due to AKI -Continue simvastatin 40 mg nightly  #) Pancreatic lesion: Patient noted to have unusual necrotic lesion in the tail of the pancreas.  MRI on 05/29/2016.  Benign etiology with recommendation to repeat in 2 years. - Plan for repeat MRI in 2 years, approximately 05/2018  Fluids: Gentle IV fluids Electrolytes: Monitor and supplement Nutrition: Carb restricted diet  Prophylaxis: Warfarin  Disposition: Pending oral antibiotics and skilled nursing facility placement  Full code   Consultants:   Urology  Procedures:   None  Antimicrobials:  IV ampicillin started 10/27/2017   Subjective: Patient reports he is doing fairly well.  He continues to report profound fatigue but is gradually improving.  He denies any nausea, vomiting, diarrhea, cough, congestion, rhinorrhea.  Objective: Vitals:   10/27/17  1600 10/27/17 2015 10/28/17 0351 10/28/17 0500  BP:  (!) 143/85 (!) 141/76   Pulse: (!) 106 98 88   Resp:  (!) 26 20 20    Temp:  98.6 F (37 C) 98.3 F (36.8 C)   TempSrc:  Axillary Oral   SpO2: 95% 96% 97%   Weight:    80.6 kg  Height:        Intake/Output Summary (Last 24 hours) at 10/28/2017 1018 Last data filed at 10/28/2017 0600 Gross per 24 hour  Intake 4679.41 ml  Output 1150 ml  Net 3529.41 ml   Filed Weights   10/26/17 1201 10/27/17 0500 10/28/17 0500  Weight: 76.8 kg 75.9 kg 80.6 kg    Examination:  General exam: Appears calm and comfortable  Respiratory system: Clear to auscultation. Respiratory effort normal. Cardiovascular system: Regular rate and rhythm, no murmurs. Gastrointestinal system: Soft, nondistended, mildly tender to deep palpation, no rebound or guarding, plus bowel sounds. Central nervous system: Alert and oriented. No focal neurological deficits. Extremities: Trace lower extremity edema Skin: No rashes over visible skin Psychiatry: Judgement and insight appear normal. Mood & affect appropriate.     Data Reviewed: I have personally reviewed following labs and imaging studies  CBC: Recent Labs  Lab 10/26/17 1201 10/27/17 0554 10/28/17 0531  WBC 23.6* 17.7* 12.7*  HGB 14.9 14.1 13.4  HCT 45.8 43.5 40.5  MCV 94.8 92.9 92.5  PLT 155 118* 132*   Basic Metabolic Panel: Recent Labs  Lab 10/26/17 1201 10/26/17 1746 10/27/17 0554 10/28/17 0531  NA 134* 135 136 136  K 4.3 4.1 4.7 3.9  CL 101 105 104 105  CO2 16* 20* 21* 22  GLUCOSE 415* 327* 317* 241*  BUN 34* 32* 34* 27*  CREATININE 1.71* 1.34* 1.27* 1.04  CALCIUM 8.8* 8.3* 7.9* 7.5*  MG  --  1.8  --  1.9   GFR: Estimated Creatinine Clearance: 57.9 mL/min (by C-G formula based on SCr of 1.04 mg/dL). Liver Function Tests: Recent Labs  Lab 10/26/17 1201 10/28/17 0531  AST 97* 62*  ALT 41 46*  ALKPHOS 60 44  BILITOT 1.2 1.0  PROT 7.2 5.6*  ALBUMIN 3.7 2.4*   Recent Labs  Lab 10/26/17 1201  LIPASE 21   No results for input(s): AMMONIA in the last 168 hours. Coagulation  Profile: Recent Labs  Lab 10/26/17 1245 10/27/17 0554 10/28/17 0531  INR 3.76 4.86* 4.96*   Cardiac Enzymes: No results for input(s): CKTOTAL, CKMB, CKMBINDEX, TROPONINI in the last 168 hours. BNP (last 3 results) No results for input(s): PROBNP in the last 8760 hours. HbA1C: Recent Labs    10/26/17 1746  HGBA1C 8.4*   CBG: Recent Labs  Lab 10/27/17 1113 10/27/17 1702 10/27/17 2016 10/27/17 2230 10/28/17 0745  GLUCAP 315* 224* 254* 291* 204*   Lipid Profile: No results for input(s): CHOL, HDL, LDLCALC, TRIG, CHOLHDL, LDLDIRECT in the last 72 hours. Thyroid Function Tests: No results for input(s): TSH, T4TOTAL, FREET4, T3FREE, THYROIDAB in the last 72 hours. Anemia Panel: No results for input(s): VITAMINB12, FOLATE, FERRITIN, TIBC, IRON, RETICCTPCT in the last 72 hours. Sepsis Labs: Recent Labs  Lab 10/26/17 1223 10/26/17 1420 10/26/17 1746 10/27/17 0554 10/28/17 0531  PROCALCITON  --   --  1.61 1.95 1.32  LATICACIDVEN 7.15* 6.73*  --  2.5*  --     Recent Results (from the past 240 hour(s))  Blood culture (routine x 2)     Status: None (Preliminary result)   Collection Time:  10/26/17 12:34 PM  Result Value Ref Range Status   Specimen Description   Final    BLOOD RIGHT ARM Performed at [facility], [address] [place], [place] [address]    Special Requests   Final    BOTTLES DRAWN AEROBIC AND ANAEROBIC Blood Culture adequate volume Performed at [facility] [address], [place], [place] [address]    Culture  Setup Time   Final    GRAM POSITIVE COCCI IN BOTH AEROBIC AND ANAEROBIC BOTTLES CRITICAL RESULT CALLED TO, READ BACK BY AND VERIFIED WITH: PHARMD J LEGGE 10/27/17 AT 908 AM BY CM Performed at [facility], [address] [place], [place] [address]    Culture GRAM POSITIVE COCCI  Final   Report Status PENDING  Incomplete  Blood Culture ID Panel (Reflexed)     Status: Abnormal   Collection Time:  10/26/17 12:34 PM  Result Value Ref Range Status   Enterococcus species DETECTED (A) NOT DETECTED Final    Comment: CRITICAL RESULT CALLED TO, READ BACK BY AND VERIFIED WITH: PHARMD J LEGGE 10/27/17 AT 908 AM BY CM    Vancomycin resistance NOT DETECTED NOT DETECTED Final   Listeria monocytogenes NOT DETECTED NOT DETECTED Final   Staphylococcus species NOT DETECTED NOT DETECTED Final   Staphylococcus aureus (BCID) NOT DETECTED NOT DETECTED Final   Streptococcus species NOT DETECTED NOT DETECTED Final   Streptococcus agalactiae NOT DETECTED NOT DETECTED Final   Streptococcus pneumoniae NOT DETECTED NOT DETECTED Final   Streptococcus pyogenes NOT DETECTED NOT DETECTED Final   Acinetobacter baumannii NOT DETECTED NOT DETECTED Final   Enterobacteriaceae species NOT DETECTED NOT DETECTED Final   Enterobacter cloacae complex NOT DETECTED NOT DETECTED Final   Escherichia coli NOT DETECTED NOT DETECTED Final   Klebsiella oxytoca NOT DETECTED NOT DETECTED Final   Klebsiella pneumoniae NOT DETECTED NOT DETECTED Final   Proteus species NOT DETECTED NOT DETECTED Final   Serratia marcescens NOT DETECTED NOT DETECTED Final   Haemophilus influenzae NOT DETECTED NOT DETECTED Final   Neisseria meningitidis NOT DETECTED NOT DETECTED Final   Pseudomonas aeruginosa NOT DETECTED NOT DETECTED Final   Candida albicans NOT DETECTED NOT DETECTED Final   Candida glabrata NOT DETECTED NOT DETECTED Final   Candida krusei NOT DETECTED NOT DETECTED Final   Candida parapsilosis NOT DETECTED NOT DETECTED Final   Candida tropicalis NOT DETECTED NOT DETECTED Final    Comment: Performed at [facility], [address], [place], [place] [address]  Blood culture (routine x 2)     Status: None (Preliminary result)   Collection Time: 10/26/17 12:45 PM  Result Value Ref Range Status   Specimen Description   Final    BLOOD LEFT ARM Performed at [facility], [address], [place], [place]  [address]    Special Requests   Final    BOTTLES DRAWN AEROBIC AND ANAEROBIC Blood Culture results may not be optimal due to an excessive volume of blood received in culture bottles Performed at [facility], [address], [place], [place] [address]    Culture   Final    NO GROWTH < 24 HOURS Performed at [facility], [address], [place], [place] [address]    Report Status PENDING  Incomplete  Urine culture     Status: Abnormal   Collection Time: 10/26/17 12:59 PM  Result Value Ref Range Status   Specimen Description   Final  URINE, RANDOM Performed at [facility], [address] [place], [place] [address]    Special Requests   Final    NONE Performed at [facility], [address], [place], [place] [address]    Culture MULTIPLE SPECIES PRESENT, SUGGEST RECOLLECTION (A)  Final   Report Status 10/27/2017 FINAL  Final  MRSA PCR Screening     Status: None   Collection Time: 10/26/17  5:44 PM  Result Value Ref Range Status   MRSA by PCR NEGATIVE NEGATIVE Final    Comment:        The GeneXpert MRSA Assay (FDA approved for NASAL specimens only), is one component of a comprehensive MRSA colonization surveillance program. It is not intended to diagnose MRSA infection nor to guide or monitor treatment for MRSA infections. Performed at [facility], [address] [place], [place] [address]          Radiology Studies: Ct Abdomen Pelvis Wo Contrast  Result Date: 10/26/2017 CLINICAL DATA:  Found down. On pain medication for kidney stones. Recent ureteral stent. Fever and hyperglycemia. History of diabetes, RIGHT stent placement October 15, 2017, kidney stones, pancreatic cyst, liver mass, cirrhosis, diverticulitis. EXAM: CT ABDOMEN AND PELVIS WITHOUT CONTRAST TECHNIQUE: Multidetector CT imaging of the abdomen and pelvis was performed following the standard protocol without IV contrast.  COMPARISON:  CT abdomen and pelvis October 15, 2017 and MRI abdomen May 29, 2016 FINDINGS: LOWER CHEST: Lung bases are clear. The visualized heart size is normal. Advanced coronary artery calcifications, incompletely evaluated. No pericardial effusion. HEPATOBILIARY: Nodular cirrhotic liver with enlarged LEFT lobe and caudate lobes. Subtle hypodense mass RIGHT lobe of the liver less conspicuous than prior imaging, likely technical. Negative non-contrast CT gallbladder. PANCREAS: Nonacute. 15 mm complex mass tail of pancreas, previously evaluated with MRI. Punctate calcifications and atrophy. SPLEEN: Mild splenomegaly. ADRENALS/URINARY TRACT: Kidneys are orthotopic, demonstrating normal size. New RIGHT nephroureteral stent with proximal retaining loop within renal pelvis. Increasing RIGHT perinephric fat stranding. 8 mm calculus renal pelvis remains. No hydronephrosis. 11 mm RIGHT lower pole nephrolithiasis. 12 mm LEFT lower pole nephrolithiasis, additional smaller LEFT nephrolithiasis. Stable exophytic 12 mm calcified LEFT renal mass. 3.4 cm cyst lower pole LEFT kidney. Limited assessment for renal masses by nonenhanced CT. Urinary bladder is well distended containing distal retaining loop. Normal adrenal glands. STOMACH/BOWEL: The stomach, small and large bowel are normal in course and caliber without inflammatory changes, sensitivity decreased by lack of enteric contrast. Small volume retained large bowel stool. A few scattered colonic diverticula noted. VASCULAR/LYMPHATIC: Aortoiliac vessels are normal in course and caliber. Mild calcific atherosclerosis. Inferior vena cava filter below the level the renal veins. Paraesophageal varicosities. No lymphadenopathy by CT size criteria. REPRODUCTIVE: Mild prostatomegaly. OTHER: No intraperitoneal free fluid or free air. Status post anterior abdominal wall herniorrhaphy. MUSCULOSKELETAL: Non-acute. Linear sclerosis RIGHT femoral head equivocal for avascular necrosis  without collapse. Osteopenia. Old moderate to severe L1 compression fracture and mild L5 compression fracture. Scattered old Schmorl's nodes. IMPRESSION: 1. Interval placement of RIGHT nephroureteral stent with decompressed collecting system. New RIGHT perinephric fat stranding, potential urinary tract infection. 2. Bilateral nonobstructing nephrolithiasis. 3. Cirrhosis and sequelae of portal hypertension without ascites. 4. 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 Aortic Atherosclerosis (ICD10-I70.0). Electronically Signed   By: Awilda Metro M.D.   On: 10/26/2017 14:29   Dg Chest Portable 1 View  Result Date: 10/26/2017 CLINICAL DATA:  Weakness, rapid heart rate EXAM: PORTABLE CHEST 1 VIEW COMPARISON:  Chest  x-ray of 05/28/2016 FINDINGS: No active infiltrate or effusion is seen. Mediastinal and hilar contours are unremarkable. The heart is mildly enlarged and stable. No acute bony abnormality is seen. IMPRESSION: Stable mild cardiomegaly.  No active lung disease. Electronically Signed   By: Dwyane Dee M.D.   On: 10/26/2017 13:12        Scheduled Meds: . insulin aspart  0-15 Units Subcutaneous TID WC  . insulin aspart  0-5 Units Subcutaneous QHS  . insulin glargine  10 Units Subcutaneous BID  . simvastatin  40 mg Oral QPM  . sodium chloride flush  3 mL Intravenous Q12H  . Warfarin - Pharmacist Dosing Inpatient   Does not apply q1800   Continuous Infusions: . sodium chloride Stopped (10/28/17 0557)  . ampicillin (OMNIPEN) IV 300 mL/hr at 10/28/17 0600     LOS: 2 days    Time spent: 35    Delaine Lame, MD [facility]  If 7PM-7AM, please contact night-coverage [email or web address] Password [email or web address] 10/28/2017, 10:18 AM

## 2017-10-28 NOTE — Progress Notes (Signed)
Pharmacy Antibiotic Note  [name] is a 77 y.o. male admitted on 10/26/2017 with enterococcus bacteremia.  Pharmacy has been consulted for ampicillin dosing.  ID following. Patient diagnosed with enterococcal bacteremia s/p UPJ stent on 10/16/17.   Today, 10/28/17  Scr = 1, CrCl 57 mL/min. Renal function improved  WBC 12.7 trending down  Repeat blood cultures collected   Day #2 of ampicillin  Plan:  Given improvement in renal function, will increase ampicillin to 2 g IV q4h  Continue to follow renal function and cultures  Height: 5\' 4"  (162.6 cm) Weight: 177 lb 11.1 oz (80.6 kg) IBW/kg (Calculated) : 59.2  Temp (24hrs), Avg:98.1 F (36.7 C), Min:97.4 F (36.3 C), Max:98.6 F (37 C)  Recent Labs  Lab 10/26/17 1201 10/26/17 1223 10/26/17 1420 10/26/17 1746 10/27/17 0554 10/28/17 0531  WBC 23.6*  --   --   --  17.7* 12.7*  CREATININE 1.71*  --   --  1.34* 1.27* 1.04  LATICACIDVEN  --  7.15* 6.73*  --  2.5*  --     Estimated Creatinine Clearance: 57.9 mL/min (by C-G formula based on SCr of 1.04 mg/dL).    Allergies  Allergen Reactions  . Prednisone     Other reaction(s): Other (See Comments) unknown  . Zithromax [Azithromycin] Other (See Comments)    Weak and flulike symptoms    Antimicrobials this admission: ampicillin 10/23 >>  Vancomycin/cefepime 10/22  Dose adjustments this admission: 10/24 ampicillin 2 g IV q6h --> ampicillin 2 g IV q4h  Microbiology results: 10/22 BCx: Enterococcus faecalis 10/24 Repeat BCx: Sent 10/22 UCx: multiple species present  10/22 MRSA PCR: Negative  Thank you for allowing pharmacy to be a part of this patient's care.  Cindi Carbon, PharmD, BCPS Clinical Pharmacist 10/28/2017 1:02 PM

## 2017-10-28 NOTE — Progress Notes (Signed)
INFECTIOUS DISEASE PROGRESS NOTE  ID: [name] is a 77 y.o. male with  Principal Problem:   Severe sepsis with acute organ dysfunction (HCC) Active Problems:   HTN (hypertension)   DM II (diabetes mellitus, type II), controlled (HCC)   Closed compression fracture of body of lumbar vertebra (HCC)   AF (atrial fibrillation) (HCC)   Hyperlipidemia   History of stroke   Presence of inferior vena cava filter   Right ureteral stone   Sepsis (HCC)   ARF (acute renal failure) (HCC)   Metabolic acidosis   Acute pyelonephritis   Acute lower UTI   Status post placement of ureteral stent:R 10/15/2017   Dehydration  Subjective: C/o feeling poorly.    Abtx:  Anti-infectives (From admission, onward)   Start     Dose/Rate Route Frequency Ordered Stop   10/28/17 1200  vancomycin (VANCOCIN) 1,500 mg in sodium chloride 0.9 % 500 mL IVPB  Status:  Discontinued     1,500 mg 250 mL/hr over 120 Minutes Intravenous Every 48 hours 10/26/17 1705 10/27/17 0926   10/27/17 1200  ampicillin (OMNIPEN) 2 g in sodium chloride 0.9 % 100 mL IVPB     2 g 300 mL/hr over 20 Minutes Intravenous Every 6 hours 10/27/17 0928     10/27/17 1000  ceFEPIme (MAXIPIME) 2 g in sodium chloride 0.9 % 100 mL IVPB  Status:  Discontinued     2 g 200 mL/hr over 30 Minutes Intravenous Every 24 hours 10/26/17 1705 10/27/17 0926   10/26/17 1645  ceFEPIme (MAXIPIME) 2 g in sodium chloride 0.9 % 100 mL IVPB  Status:  Discontinued     2 g 200 mL/hr over 30 Minutes Intravenous  Once 10/26/17 1637 10/26/17 1642   10/26/17 1645  vancomycin (VANCOCIN) IVPB 1000 mg/200 mL premix  Status:  Discontinued     1,000 mg 200 mL/hr over 60 Minutes Intravenous  Once 10/26/17 1637 10/26/17 1642   10/26/17 1230  ceFEPIme (MAXIPIME) 2 g in sodium chloride 0.9 % 100 mL IVPB     2 g 200 mL/hr over 30 Minutes Intravenous  Once 10/26/17 1216 10/26/17 1316   10/26/17 1230  vancomycin (VANCOCIN) 1,500 mg in sodium chloride 0.9 % 500 mL IVPB       1,500 mg 250 mL/hr over 120 Minutes Intravenous  Once 10/26/17 1216 10/26/17 1607      Medications:  Scheduled: . insulin aspart  0-15 Units Subcutaneous TID WC  . insulin aspart  0-5 Units Subcutaneous QHS  . insulin glargine  15 Units Subcutaneous BID  . simvastatin  40 mg Oral QPM  . sodium chloride flush  3 mL Intravenous Q12H  . Warfarin - Pharmacist Dosing Inpatient   Does not apply q1800    Objective: Vital signs in last 24 hours: Temp:  [97.4 F (36.3 C)-98.8 F (37.1 C)] 98.3 F (36.8 C) (10/24 0351) Pulse Rate:  [88-127] 88 (10/24 0351) Resp:  [20-31] 20 (10/24 0351) BP: (141-171)/(76-85) 141/76 (10/24 0351) SpO2:  [87 %-97 %] 97 % (10/24 0351) Weight:  [80.6 kg] 80.6 kg (10/24 0500)   General appearance: alert, cooperative and no distress Resp: clear to auscultation bilaterally Cardio: regular rate and rhythm GI: normal findings: bowel sounds normal and soft and abnormal findings:  distended Extremities: edema none  Lab Results Recent Labs    10/27/17 0554 10/28/17 0531  WBC 17.7* 12.7*  HGB 14.1 13.4  HCT 43.5 40.5  NA 136 136  K 4.7 3.9  CL  104 105  CO2 21* 22  BUN 34* 27*  CREATININE 1.27* 1.04   Liver Panel Recent Labs    10/26/17 1201 10/28/17 0531  PROT 7.2 5.6*  ALBUMIN 3.7 2.4*  AST 97* 62*  ALT 41 46*  ALKPHOS 60 44  BILITOT 1.2 1.0  BILIDIR 0.4*  --   IBILI 0.8  --    Sedimentation Rate No results for input(s): ESRSEDRATE in the last 72 hours. C-Reactive Protein No results for input(s): CRP in the last 72 hours.  Microbiology: Recent Results (from the past 240 hour(s))  Blood culture (routine x 2)     Status: None (Preliminary result)   Collection Time: 10/26/17 12:34 PM  Result Value Ref Range Status   Specimen Description   Final    BLOOD RIGHT ARM Performed at [facility], [address] [place], [place] [address]    Special Requests   Final    BOTTLES DRAWN AEROBIC AND ANAEROBIC Blood  Culture adequate volume Performed at [facility], [address], [place], [place] [address]    Culture  Setup Time   Final    GRAM POSITIVE COCCI IN BOTH AEROBIC AND ANAEROBIC BOTTLES CRITICAL RESULT CALLED TO, READ BACK BY AND VERIFIED WITH: PHARMD J LEGGE 10/27/17 AT 908 AM BY CM Performed at [facility], [address] [place], [place] [address]    Culture GRAM POSITIVE COCCI  Final   Report Status PENDING  Incomplete  Blood Culture ID Panel (Reflexed)     Status: Abnormal   Collection Time: 10/26/17 12:34 PM  Result Value Ref Range Status   Enterococcus species DETECTED (A) NOT DETECTED Final    Comment: CRITICAL RESULT CALLED TO, READ BACK BY AND VERIFIED WITH: PHARMD J LEGGE 10/27/17 AT 908 AM BY CM    Vancomycin resistance NOT DETECTED NOT DETECTED Final   Listeria monocytogenes NOT DETECTED NOT DETECTED Final   Staphylococcus species NOT DETECTED NOT DETECTED Final   Staphylococcus aureus (BCID) NOT DETECTED NOT DETECTED Final   Streptococcus species NOT DETECTED NOT DETECTED Final   Streptococcus agalactiae NOT DETECTED NOT DETECTED Final   Streptococcus pneumoniae NOT DETECTED NOT DETECTED Final   Streptococcus pyogenes NOT DETECTED NOT DETECTED Final   Acinetobacter baumannii NOT DETECTED NOT DETECTED Final   Enterobacteriaceae species NOT DETECTED NOT DETECTED Final   Enterobacter cloacae complex NOT DETECTED NOT DETECTED Final   Escherichia coli NOT DETECTED NOT DETECTED Final   Klebsiella oxytoca NOT DETECTED NOT DETECTED Final   Klebsiella pneumoniae NOT DETECTED NOT DETECTED Final   Proteus species NOT DETECTED NOT DETECTED Final   Serratia marcescens NOT DETECTED NOT DETECTED Final   Haemophilus influenzae NOT DETECTED NOT DETECTED Final   Neisseria meningitidis NOT DETECTED NOT DETECTED Final   Pseudomonas aeruginosa NOT DETECTED NOT DETECTED Final   Candida albicans NOT DETECTED NOT DETECTED Final   Candida glabrata NOT DETECTED  NOT DETECTED Final   Candida krusei NOT DETECTED NOT DETECTED Final   Candida parapsilosis NOT DETECTED NOT DETECTED Final   Candida tropicalis NOT DETECTED NOT DETECTED Final    Comment: Performed at [facility], [address] [place], [place] [address]  Blood culture (routine x 2)     Status: None (Preliminary result)   Collection Time: 10/26/17 12:45 PM  Result Value Ref Range Status   Specimen Description   Final    BLOOD LEFT ARM Performed at [facility], [address] [place],  [place] [address]    Special Requests   Final    BOTTLES DRAWN AEROBIC AND ANAEROBIC Blood Culture results may not be optimal due to an excessive volume of blood received in culture bottles Performed at [facility], [address], [place], [place] [address]    Culture   Final    NO GROWTH 2 DAYS Performed at [facility], [address], [place], [place] [address]    Report Status PENDING  Incomplete  Urine culture     Status: Abnormal   Collection Time: 10/26/17 12:59 PM  Result Value Ref Range Status   Specimen Description   Final    URINE, RANDOM Performed at [facility], [address], [place], [place] [address]    Special Requests   Final    NONE Performed at [facility], [address] [place], [place] [address]    Culture MULTIPLE SPECIES PRESENT, SUGGEST RECOLLECTION (A)  Final   Report Status 10/27/2017 FINAL  Final  MRSA PCR Screening     Status: None   Collection Time: 10/26/17  5:44 PM  Result Value Ref Range Status   MRSA by PCR NEGATIVE NEGATIVE Final    Comment:        The GeneXpert MRSA Assay (FDA approved for NASAL specimens only), is one component of a comprehensive MRSA colonization surveillance program. It is not intended to diagnose MRSA infection nor to guide or monitor treatment for MRSA infections. Performed at [facility], [address],  [place], [place] [address]     Studies/Results: Ct Abdomen Pelvis Wo Contrast  Result Date: 10/26/2017 CLINICAL DATA:  Found down. On pain medication for kidney stones. Recent ureteral stent. Fever and hyperglycemia. History of diabetes, RIGHT stent placement October 15, 2017, kidney stones, pancreatic cyst, liver mass, cirrhosis, diverticulitis. EXAM: CT ABDOMEN AND PELVIS WITHOUT CONTRAST TECHNIQUE: Multidetector CT imaging of the abdomen and pelvis was performed following the standard protocol without IV contrast. COMPARISON:  CT abdomen and pelvis October 15, 2017 and MRI abdomen May 29, 2016 FINDINGS: LOWER CHEST: Lung bases are clear. The visualized heart size is normal. Advanced coronary artery calcifications, incompletely evaluated. No pericardial effusion. HEPATOBILIARY: Nodular cirrhotic liver with enlarged LEFT lobe and caudate lobes. Subtle hypodense mass RIGHT lobe of the liver less conspicuous than prior imaging, likely technical. Negative non-contrast CT gallbladder. PANCREAS: Nonacute. 15 mm complex mass tail of pancreas, previously evaluated with MRI. Punctate calcifications and atrophy. SPLEEN: Mild splenomegaly. ADRENALS/URINARY TRACT: Kidneys are orthotopic, demonstrating normal size. New RIGHT nephroureteral stent with proximal retaining loop within renal pelvis. Increasing RIGHT perinephric fat stranding. 8 mm calculus renal pelvis remains. No hydronephrosis. 11 mm RIGHT lower pole nephrolithiasis. 12 mm LEFT lower pole nephrolithiasis, additional smaller LEFT nephrolithiasis. Stable exophytic 12 mm calcified LEFT renal mass. 3.4 cm cyst lower pole LEFT kidney. Limited assessment for renal masses by nonenhanced CT. Urinary bladder is well distended containing distal retaining loop. Normal adrenal glands. STOMACH/BOWEL: The stomach, small and large bowel are normal in course and caliber without inflammatory changes, sensitivity decreased by lack of enteric contrast. Small volume retained large  bowel stool. A few scattered colonic diverticula noted. VASCULAR/LYMPHATIC: Aortoiliac vessels are normal in course and caliber. Mild calcific atherosclerosis. Inferior vena cava filter below the level the renal veins. Paraesophageal varicosities. No lymphadenopathy by CT size criteria. REPRODUCTIVE: Mild prostatomegaly. OTHER: No intraperitoneal free fluid or free air. Status post anterior abdominal wall herniorrhaphy. MUSCULOSKELETAL: Non-acute. Linear sclerosis  RIGHT femoral head equivocal for avascular necrosis without collapse. Osteopenia. Old moderate to severe L1 compression fracture and mild L5 compression fracture. Scattered old Schmorl's nodes. IMPRESSION: 1. Interval placement of RIGHT nephroureteral stent with decompressed collecting system. New RIGHT perinephric fat stranding, potential urinary tract infection. 2. Bilateral nonobstructing nephrolithiasis. 3. Cirrhosis and sequelae of portal hypertension without ascites. 4. 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 Aortic Atherosclerosis (ICD10-I70.0). Electronically Signed   By: Awilda Metro M.D.   On: 10/26/2017 14:29   Dg Chest Portable 1 View  Result Date: 10/26/2017 CLINICAL DATA:  Weakness, rapid heart rate EXAM: PORTABLE CHEST 1 VIEW COMPARISON:  Chest x-ray of 05/28/2016 FINDINGS: No active infiltrate or effusion is seen. Mediastinal and hilar contours are unremarkable. The heart is mildly enlarged and stable. No acute bony abnormality is seen. IMPRESSION: Stable mild cardiomegaly.  No active lung disease. Electronically Signed   By: Dwyane Dee M.D.   On: 10/26/2017 13:12     Assessment/Plan: Enterococcal bacteremia DM2 with hyperglycemia, acidosis UPJ stent 10-16-17 AKI (improving)  Total days of antibiotics: 2 amp  Repeat BCx sent this AM Needs TEE.  Urology f/u Glc improving, CO2 normalized.          Johny Sax MD, FACP Infectious Diseases (pager) [phone] [email or web address] 10/28/2017, 11:09 AM   LOS: 2 days

## 2017-10-28 NOTE — Evaluation (Signed)
Occupational Therapy Evaluation Patient Details Name: [name] MRN: [id number] DOB: [birth year] Today's Date: 10/28/2017    History of Present Illness [name] is a 77 y.o. male with medical history significant of nephrolithiasis with recent right UPJ calculus status post cystoscopy and right urethral stent placement 10/15/2017, diabetes mellitus, hypertension, history of DVT/PE status post IVC filter, history of atrial fibrillation, history of cirrhosis, pancreatic cyst noted on CT 05/28/2016, right liver mass noted on CT 05/28/2016, history of stroke presenting to the ED 10/22 with history of fall 2 days PTA, admitted  with generalized weakness, fever, d worsening R flank pain , UA was notable for pyuria, was tachycardic, tachypneic and hypertensive   Clinical Impression   Pt admitted as above currently demonstrating deficits in his ability to perform ADL's and functional mobility/transfers related to ADL's (refer to OT problem list below). Pt with noted critical lab values and was seen as co-tx w/ PT keeping that in mind. Pt should benefit from acute OT followed by short term rehab at [facility] when medically able. Will follow acutely.    Follow Up Recommendations  SNF;Supervision/Assistance - 24 hour    Equipment Recommendations  Other (comment)(Defer to next venue)    Recommendations for Other Services       Precautions / Restrictions Precautions Precautions: Fall Precaution Comments: mobility limited 2* incr INR Restrictions Weight Bearing Restrictions: No      Mobility Bed Mobility Overal bed mobility: Needs Assistance Bed Mobility: Supine to Sit     Supine to sit: Supervision;Min guard     General bed mobility comments: extra time to move legs and trunk, supervision for safety  Transfers Overall transfer level: Needs assistance Equipment used: Rolling walker (2 wheeled) Transfers: Sit to/from [employer] Sit to Stand: Min guard;Min assist Stand  pivot transfers: Min guard       General transfer comment: repeated sit<> stand trials for activity tolerance; min to min guard to rise and steady, incr time needed    Balance Overall balance assessment: History of Falls;Needs assistance Sitting-balance support: Feet supported;No upper extremity supported Sitting balance-Leahy Scale: Good     Standing balance support: During functional activity;Bilateral upper extremity supported Standing balance-Leahy Scale: Fair                             ADL either performed or assessed with clinical judgement   ADL Overall ADL's : Needs assistance/impaired Eating/Feeding: Set up;Bed level;Sitting Eating/Feeding Details (indicate cue type and reason): Pt reports decreaed appetite Grooming: Wash/dry hands;Wash/dry face;Minimal assistance;Standing Grooming Details (indicate cue type and reason): Pt with forward lean when washing his face and reports feeling weak Upper Body Bathing: Minimal assistance;Sitting   Lower Body Bathing: Moderate assistance;Sit to/from stand   Upper Body Dressing : Minimal assistance;Sitting   Lower Body Dressing: Moderate assistance;Sit to/from stand   Toilet Transfer: Minimal assistance;RW;Stand-pivot;BSC   Toileting- Clothing Manipulation and Hygiene: Minimal assistance;Sit to/from stand       Functional mobility during ADLs: Minimal assistance;Rolling walker General ADL Comments: Pt seen with PT as co-treat as pt was with noted critical lab values including INR 4.96. Pt was assessed for OT and is currently requiring increaesd assist for ADL's but is indepdendent PTA. Pt lives alone and should benefit from acute OT prior to d/c to SNF Rehab for short term. Pt was educated in role of OT and plan of care/goals.     Vision Baseline Vision/History: Wears glasses Wears  Glasses: At all times Patient Visual Report: No change from baseline       Perception     Praxis      Pertinent Vitals/Pain  Pain Assessment: No/denies pain     Hand Dominance Right   Extremity/Trunk Assessment Upper Extremity Assessment Upper Extremity Assessment: Generalized weakness   Lower Extremity Assessment Lower Extremity Assessment: Defer to PT evaluation;Generalized weakness   Cervical / Trunk Assessment Cervical / Trunk Assessment: Normal   Communication Communication Communication: No difficulties   Cognition Arousal/Alertness: Awake/alert Behavior During Therapy: WFL for tasks assessed/performed Overall Cognitive Status: Within Functional Limits for tasks assessed                                     General Comments       Exercises General Exercises - Lower Extremity Hip Flexion/Marching: AROM;Strengthening;10 reps;Both Heel Raises: AROM;Both;10 reps   Shoulder Instructions      Home Living Family/patient expects to be discharged to:: Private residence Living Arrangements: Alone Available Help at Discharge: Friend(s);Available PRN/intermittently Type of Home: Apartment Home Access: Stairs to enter Entrance Stairs-Number of Steps: 6 then 9 Entrance Stairs-Rails: Right Home Layout: One level     Bathroom Shower/Tub: Tub/shower unit;Walk-in shower   Bathroom Toilet: Standard     Home Equipment: Walker - 4 wheels          Prior Functioning/Environment Level of Independence: Independent        Comments: drives        OT Problem List: Decreased strength;Decreased activity tolerance;Decreased knowledge of use of DME or AE;Decreased knowledge of precautions;Impaired balance (sitting and/or standing)      OT Treatment/Interventions: Self-care/ADL training;DME and/or AE instruction;Therapeutic activities;Energy conservation;Patient/family education    OT Goals(Current goals can be found in the care plan section) Acute Rehab OT Goals Patient Stated Goal: Feel better OT Goal Formulation: With patient Time For Goal Achievement: 11/11/17 Potential to  Achieve Goals: Good  OT Frequency: Min 2X/week   Barriers to D/C:    Lives alone       Co-evaluation PT/OT/SLP Co-Evaluation/Treatment: Yes Reason for Co-Treatment: Complexity of the patient's impairments (multi-system involvement);Other (comment);For patient/therapist safety(Pt with some critical lab values/co-tx for pt safety) PT goals addressed during session: Mobility/safety with mobility;Balance OT goals addressed during session: ADL's and self-care;Proper use of Adaptive equipment and DME      AM-PAC PT "6 Clicks" Daily Activity     Outcome Measure Help from another person eating meals?: None Help from another person taking care of personal grooming?: A Lot Help from another person toileting, which includes using toliet, bedpan, or urinal?: A Little Help from another person bathing (including washing, rinsing, drying)?: A Little Help from another person to put on and taking off regular upper body clothing?: A Little Help from another person to put on and taking off regular lower body clothing?: A Lot 6 Click Score: 17   End of Session Equipment Utilized During Treatment: Gait belt;Rolling walker Nurse Communication: Mobility status;Other (comment)(Heart monitor not working properly, RN aware and assessing )  Activity Tolerance: Patient limited by fatigue;Patient limited by lethargy;Patient tolerated treatment well Patient left: in chair;with call bell/phone within reach;with chair alarm set  OT Visit Diagnosis: Muscle weakness (generalized) (M62.81);Unsteadiness on feet (R26.81)                Time: 4098-1191 OT Time Calculation (min): 18 min Charges:  OT General Charges $OT Visit:  1 Visit OT Evaluation $OT Eval Moderate Complexity: 1 Mod  [place]hill, Amy Beth Dixon, OTR/L 10/28/2017, 11:05 AM

## 2017-10-28 NOTE — Progress Notes (Signed)
ANTICOAGULATION CONSULT NOTE   Pharmacy Consult for Warfarin Indication: atrial fibrillation, pulmonary embolus, DVT and stroke  Allergies  Allergen Reactions  . Prednisone     Other reaction(s): Other (See Comments) unknown  . Zithromax [Azithromycin] Other (See Comments)    Weak and flulike symptoms    Patient Measurements: Height: 5\' 4"  (162.6 cm) Weight: 177 lb 11.1 oz (80.6 kg) IBW/kg (Calculated) : 59.2  Vital Signs: Temp: 98.3 F (36.8 C) (10/24 0351) Temp Source: Oral (10/24 0351) BP: 141/76 (10/24 0351) Pulse Rate: 88 (10/24 0351)  Labs: Recent Labs    10/26/17 1201 10/26/17 1245 10/26/17 1746 10/27/17 0554 10/28/17 0531  HGB 14.9  --   --  14.1 13.4  HCT 45.8  --   --  43.5 40.5  PLT 155  --   --  118* 132*  APTT  --   --  33  --   --   LABPROT  --  36.6*  --  44.6* 45.3*  INR  --  3.76  --  4.86* 4.96*  CREATININE 1.71*  --  1.34* 1.27* 1.04    Estimated Creatinine Clearance: 57.9 mL/min (by C-G formula based on SCr of 1.04 mg/dL).   Medications:  Scheduled:  . insulin aspart  0-15 Units Subcutaneous TID WC  . insulin aspart  0-5 Units Subcutaneous QHS  . insulin glargine  15 Units Subcutaneous BID  . simvastatin  40 mg Oral QPM  . sodium chloride flush  3 mL Intravenous Q12H  . Warfarin - Pharmacist Dosing Inpatient   Does not apply q1800   Infusions:  . ampicillin (OMNIPEN) IV      Assessment: [name] admitted 10/22 with urosepsis.  He is on warfarin PTA for PE/DVT 2002, Afib and stroke 2002.  Pharmacy consulted to dose warfarin.  PTA warfarin dose 3 mg daily except 2 mg on Mon/Fri.  Last dose 10/21. Admission INR 3.75  Today, 10/28/2017:  INR = 4.96 remains supratherapeutic  CBC: Hgb 13.4, stable; Plt 132 low but stable  No bleeding or complications noted  Diet: carb modified  No major drug drug interactions.  Broad spectrum antibiotics may prolong INR.  Goal of Therapy:  INR 2-3 Monitor platelets by anticoagulation  protocol: Yes   Plan:   HOLD warfarin today for elevated INR.  Daily PT/INR.  Monitor for signs and symptoms of bleeding.  Cindi Carbon, PharmD 10/28/17 1:30 PM

## 2017-10-29 LAB — BASIC METABOLIC PANEL WITH GFR
BUN: 26 mg/dL — ABNORMAL HIGH (ref 8–23)
Chloride: 103 mmol/L (ref 98–111)
Creatinine, Ser: 0.88 mg/dL (ref 0.61–1.24)
GFR calc non Af Amer: >60 mL/min (ref >60)

## 2017-10-29 LAB — CBC
HCT: 39.7 % (ref 39.0–52.0)
Hemoglobin: 13 g/dL (ref 13.0–17.0)
MCH: 30.4 pg (ref 26.0–34.0)
MCHC: 32.7 g/dL (ref 30.0–36.0)
MCV: 92.8 fL (ref 80.0–100.0)
Platelets: 123 K/uL — ABNORMAL LOW (ref 150–400)
RBC: 4.28 MIL/uL (ref 4.22–5.81)
RDW: 14.2 % (ref 11.5–15.5)
WBC: 12.7 10*3/uL — ABNORMAL HIGH (ref 4.0–10.5)
nRBC: 0 % (ref 0.0–0.2)

## 2017-10-29 LAB — BASIC METABOLIC PANEL
Anion gap: 10 (ref 5–15)
CO2: 20 mmol/L — ABNORMAL LOW (ref 22–32)
Calcium: 7.4 mg/dL — ABNORMAL LOW (ref 8.9–10.3)
GFR calc Af Amer: >60 mL/min (ref >60)
Glucose, Bld: 188 mg/dL — ABNORMAL HIGH (ref 70–99)
Potassium: 3.7 mmol/L (ref 3.5–5.1)
Sodium: 133 mmol/L — ABNORMAL LOW (ref 135–145)

## 2017-10-29 LAB — CULTURE, BLOOD (ROUTINE X 2): Special Requests: ADEQUATE

## 2017-10-29 LAB — PROTIME-INR
INR: 5.29
PROTHROMBIN TIME: 47.7 s — AB (ref 11.4–15.2)

## 2017-10-29 LAB — GLUCOSE, CAPILLARY
GLUCOSE-CAPILLARY: 148 mg/dL — AB (ref 70–99)
GLUCOSE-CAPILLARY: 141 mg/dL — AB (ref 70–99)
Glucose-Capillary: 178 mg/dL — ABNORMAL HIGH (ref 70–99)
Glucose-Capillary: 161 mg/dL — ABNORMAL HIGH (ref 70–99)

## 2017-10-29 MED ORDER — SODIUM CHLORIDE 0.9% IV SOLUTION: Freq: Once | INTRAVENOUS | Status: DC

## 2017-10-29 MED ORDER — FUROSEMIDE 10 MG/ML IJ SOLN
20.0000 mg | Freq: Once | INTRAMUSCULAR | Status: AC
  Administered 2017-10-29: 20 mg via INTRAVENOUS
  Filled 2017-10-29: qty 2

## 2017-10-29 NOTE — Progress Notes (Signed)
PT Cancellation Note  Patient Details Name: [name] MRN: [id number] DOB: [birth year]   Cancelled Treatment:    Reason Eval/Treat Not Completed: Medical issues which prohibited therapy--pt receiving plasma. Will check back another day/time.    Rebeca Alert, PT Acute Rehabilitation Services Pager: [phone] Office: [phone]

## 2017-10-29 NOTE — Progress Notes (Signed)
OT Cancellation Note  Patient Details Name: [name] MRN: [id number] DOB: [birth year]   Cancelled Treatment:    Reason Eval/Treat Not Completed: Medical issues which prohibited therapy- pt receiving plasma and current INR value is out of therapeutic norms to continue OT POC. Will continue to follow pt as medically appropriate and available.  Dalphine Handing, MSOT, OTR/L Behavioral Health OT/ Acute Relief OT [phone]  Dalphine Handing 10/29/2017, 2:05 PM

## 2017-10-29 NOTE — Progress Notes (Addendum)
PROGRESS NOTE    [name]  ZOX:[id number] DOB: [birth year] DOA: 10/26/2017 PCP: [name], MD   Brief Narrative:  77 year old with past medical history relevant for DVT/PE status post IVC filter placement, paroxysmal atrial fibrillation on warfarin, hypertension, type 2 diabetes not on insulin, hyperlipidemia, nephrolithiasis status post right UPJ stent placement on 10/16/2017 for hydronephrosis , cirrhosis, pancreatic cyst (last MRI of abdomen on 05/29/2016 favors benign, repeat in 2 years), indeterminate liver lesion (MRI of abdomen on 05/29/2016 recommends repeat MRI in 3 months) who presents with profound weakness, sepsis, AKI found to have right-sided pyelonephritis with enterococcus bacteremia.   Assessment & Plan:   Principal Problem:   Severe sepsis with acute organ dysfunction (HCC) Active Problems:   HTN (hypertension)   DM II (diabetes mellitus, type II), controlled (HCC)   Closed compression fracture of body of lumbar vertebra (HCC)   AF (atrial fibrillation) (HCC)   Hyperlipidemia   History of stroke   Presence of inferior vena cava filter   Right ureteral stone   Sepsis (HCC)   ARF (acute renal failure) (HCC)   Metabolic acidosis   Acute pyelonephritis   Acute lower UTI   Status post placement of ureteral stent:R 10/15/2017   Dehydration   #) Enterococcus sepsis due to pyelonephritis: CT abdomen pelvis on admission showed evidence of pyelonephritis on the right.  Intestinal enough this is where he has had his stent placed approximately 10 days ago however there is no evidence of malposition of the stent. -Continue IV ampicillin started 10/27/2017 - blood cultures on 10/27/2017 no growth to date - Blood cultures from 10/26/2017 growing enterococcus -Echo on 10/27/2017 showed only grade 1 diastolic dysfunction -Infectious disease is recommending TEE  #) Nephrolithiasis status post right UPJ stent on 10/16/2017: Repeat CT imaging on admission shows that  renal pelvis is decompressed. -Discussed with urology, they recommend treating infection and then they will as an outpatient continue treating stones  #) AKI: Resolved  #) DVT/PE/atrial fibrillation: Patient is mildly supratherapeutic INR, will hold -Continue warfarin, pharmacy consult -We will give 2 units FFP with furosemide in between to help with reversal  #) Type 2 diabetes: Patient is only on home oral hypoglycemics however his blood sugars here been quite elevated so patient was started on insulin - Continue glargine to 15 units twice daily -Hold home glimepiride 4 mg every morning -Hold home glipizide 2.5 mg daily -Hold home metformin 500 mg twice daily  #) Cirrhosis/indeterminate liver lesion: Per MRI done on 05/29/2016 patient was noted to have cirrhosis and indeterminate liver lesion with recommendation for repeat MRI in 3 months.  This was not performed. -We will order repeat MRI of the abdomen to evaluate liver lesion once creatinine downtrends  #) Hypertension/hyperlipidemia: -Hold home losartan 100 mg every morning due to AKI -Continue simvastatin 40 mg nightly  #) Pancreatic lesion: Patient noted to have unusual necrotic lesion in the tail of the pancreas.  MRI on 05/29/2016.  Benign etiology with recommendation to repeat in 2 years. - Plan for repeat MRI in 2 years, approximately 05/2018  Fluids: Gentle IV fluids Electrolytes: Monitor and supplement Nutrition: Carb restricted diet  Prophylaxis: Warfarin  Disposition: Pending oral antibiotics and skilled nursing facility placement  Full code   Consultants:   Urology  Procedures:   None  Antimicrobials:  IV ampicillin started 10/27/2017   Subjective: Patient reports he is doing about the same as yesterday.  He denies any nausea, vomiting, diarrhea, cough, congestion, rhinorrhea.  He  continues to report profound weakness.  Objective: Vitals:   10/28/17 1310 10/28/17 2224 10/29/17 0534 10/29/17 0850    BP: (!) 151/84 (!) 155/89 (!) 145/74   Pulse: 83 84 83   Resp: 20 (!) 26 (!) 24   Temp: 98.2 F (36.8 C) 98.4 F (36.9 C) 98.4 F (36.9 C)   TempSrc: Oral Oral Oral   SpO2: 96% 97% 96% 93%  Weight:   81.7 kg   Height:        Intake/Output Summary (Last 24 hours) at 10/29/2017 1051 Last data filed at 10/29/2017 1001 Gross per 24 hour  Intake 1476.09 ml  Output 1456 ml  Net 20.09 ml   Filed Weights   10/27/17 0500 10/28/17 0500 10/29/17 0534  Weight: 75.9 kg 80.6 kg 81.7 kg    Examination:  General exam: Appears calm and comfortable  Respiratory system: Clear to auscultation. Respiratory effort normal. Cardiovascular system: Regular rate and rhythm, no murmurs. Gastrointestinal system: Soft, nondistended, minimally tender to deep palpation, no rebound or guarding, plus bowel sounds. Central nervous system: Alert and oriented. No focal neurological deficits. Extremities: Trace lower extremity edema Skin: No rashes over visible skin Psychiatry: Judgement and insight appear normal. Mood & affect appropriate.     Data Reviewed: I have personally reviewed following labs and imaging studies  CBC: Recent Labs  Lab 10/26/17 1201 10/27/17 0554 10/28/17 0531 10/29/17 0523  WBC 23.6* 17.7* 12.7* 12.7*  HGB 14.9 14.1 13.4 13.0  HCT 45.8 43.5 40.5 39.7  MCV 94.8 92.9 92.5 92.8  PLT 155 118* 132* 123*   Basic Metabolic Panel: Recent Labs  Lab 10/26/17 1201 10/26/17 1746 10/27/17 0554 10/28/17 0531 10/29/17 0523  NA 134* 135 136 136 133*  K 4.3 4.1 4.7 3.9 3.7  CL 101 105 104 105 103  CO2 16* 20* 21* 22 20*  GLUCOSE 415* 327* 317* 241* 188*  BUN 34* 32* 34* 27* 26*  CREATININE 1.71* 1.34* 1.27* 1.04 0.88  CALCIUM 8.8* 8.3* 7.9* 7.5* 7.4*  MG  --  1.8  --  1.9  --    GFR: Estimated Creatinine Clearance: 68.9 mL/min (by C-G formula based on SCr of 0.88 mg/dL). Liver Function Tests: Recent Labs  Lab 10/26/17 1201 10/28/17 0531  AST 97* 62*  ALT 41 46*   ALKPHOS 60 44  BILITOT 1.2 1.0  PROT 7.2 5.6*  ALBUMIN 3.7 2.4*   Recent Labs  Lab 10/26/17 1201  LIPASE 21   No results for input(s): AMMONIA in the last 168 hours. Coagulation Profile: Recent Labs  Lab 10/26/17 1245 10/27/17 0554 10/28/17 0531 10/29/17 0523  INR 3.76 4.86* 4.96* 5.29*   Cardiac Enzymes: No results for input(s): CKTOTAL, CKMB, CKMBINDEX, TROPONINI in the last 168 hours. BNP (last 3 results) No results for input(s): PROBNP in the last 8760 hours. HbA1C: Recent Labs    10/26/17 1746  HGBA1C 8.4*   CBG: Recent Labs  Lab 10/28/17 0745 10/28/17 1203 10/28/17 1620 10/28/17 2229 10/29/17 0807  GLUCAP 204* 198* 189* 165* 178*   Lipid Profile: No results for input(s): CHOL, HDL, LDLCALC, TRIG, CHOLHDL, LDLDIRECT in the last 72 hours. Thyroid Function Tests: No results for input(s): TSH, T4TOTAL, FREET4, T3FREE, THYROIDAB in the last 72 hours. Anemia Panel: No results for input(s): VITAMINB12, FOLATE, FERRITIN, TIBC, IRON, RETICCTPCT in the last 72 hours. Sepsis Labs: Recent Labs  Lab 10/26/17 1223 10/26/17 1420 10/26/17 1746 10/27/17 0554 10/28/17 0531  PROCALCITON  --   --  1.61 1.95 1.32  LATICACIDVEN 7.15* 6.73*  --  2.5*  --     Recent Results (from the past 240 hour(s))  Blood culture (routine x 2)     Status: Abnormal   Collection Time: 10/26/17 12:34 PM  Result Value Ref Range Status   Specimen Description   Final    BLOOD RIGHT ARM Performed at [facility], [address], [place], [place] [address]    Special Requests   Final    BOTTLES DRAWN AEROBIC AND ANAEROBIC Blood Culture adequate volume Performed at [facility], [address] [place], [place] [address]    Culture  Setup Time   Final    GRAM POSITIVE COCCI IN BOTH AEROBIC AND ANAEROBIC BOTTLES CRITICAL RESULT CALLED TO, READ BACK BY AND VERIFIED WITH: PHARMD J LEGGE 10/27/17 AT 908 AM BY CM Performed at [facility], [address] [place], [place] [address]    Culture ENTEROCOCCUS FAECALIS (A)  Final   Report Status 10/29/2017 FINAL  Final   Organism ID, Bacteria ENTEROCOCCUS FAECALIS  Final      Susceptibility   Enterococcus faecalis - MIC*    AMPICILLIN <=2 SENSITIVE Sensitive     VANCOMYCIN 1 SENSITIVE Sensitive     GENTAMICIN SYNERGY SENSITIVE Sensitive     * ENTEROCOCCUS FAECALIS  Blood Culture ID Panel (Reflexed)     Status: Abnormal   Collection Time: 10/26/17 12:34 PM  Result Value Ref Range Status   Enterococcus species DETECTED (A) NOT DETECTED Final    Comment: CRITICAL RESULT CALLED TO, READ BACK BY AND VERIFIED WITH: PHARMD J LEGGE 10/27/17 AT 908 AM BY CM    Vancomycin resistance NOT DETECTED NOT DETECTED Final   Listeria monocytogenes NOT DETECTED NOT DETECTED Final   Staphylococcus species NOT DETECTED NOT DETECTED Final   Staphylococcus aureus (BCID) NOT DETECTED NOT DETECTED Final   Streptococcus species NOT DETECTED NOT DETECTED Final   Streptococcus agalactiae NOT DETECTED NOT DETECTED Final   Streptococcus pneumoniae NOT DETECTED NOT DETECTED Final   Streptococcus pyogenes NOT DETECTED NOT DETECTED Final   Acinetobacter baumannii NOT DETECTED NOT DETECTED Final   Enterobacteriaceae species NOT DETECTED NOT DETECTED Final   Enterobacter cloacae complex NOT DETECTED NOT DETECTED Final   Escherichia coli NOT DETECTED NOT DETECTED Final   Klebsiella oxytoca NOT DETECTED NOT DETECTED Final   Klebsiella pneumoniae NOT DETECTED NOT DETECTED Final   Proteus species NOT DETECTED NOT DETECTED Final   Serratia marcescens NOT DETECTED NOT DETECTED Final   Haemophilus influenzae NOT DETECTED NOT DETECTED Final   Neisseria meningitidis NOT DETECTED NOT DETECTED Final   Pseudomonas aeruginosa NOT DETECTED NOT DETECTED Final   Candida albicans NOT DETECTED NOT DETECTED Final   Candida glabrata NOT DETECTED NOT DETECTED Final   Candida krusei NOT DETECTED NOT DETECTED Final   Candida  parapsilosis NOT DETECTED NOT DETECTED Final   Candida tropicalis NOT DETECTED NOT DETECTED Final    Comment: Performed at [facility], [address], [place], [place] [address]  Blood culture (routine x 2)     Status: None (Preliminary result)   Collection Time: 10/26/17 12:45 PM  Result Value Ref Range Status   Specimen Description   Final    BLOOD LEFT ARM Performed at [facility], [address], [place], [place] [address]    Special Requests   Final    BOTTLES DRAWN AEROBIC AND ANAEROBIC Blood Culture results may not be optimal due to an  excessive volume of blood received in culture bottles Performed at [facility], [address] [place], [place] [address]    Culture   Final    NO GROWTH 3 DAYS Performed at [facility], [address] [place], [place] [address]    Report Status PENDING  Incomplete  Urine culture     Status: Abnormal   Collection Time: 10/26/17 12:59 PM  Result Value Ref Range Status   Specimen Description   Final    URINE, RANDOM Performed at [facility], [address], [place], [place] [address]    Special Requests   Final    NONE Performed at [facility], [address] [place], [place] [address]    Culture MULTIPLE SPECIES PRESENT, SUGGEST RECOLLECTION (A)  Final   Report Status 10/27/2017 FINAL  Final  MRSA PCR Screening     Status: None   Collection Time: 10/26/17  5:44 PM  Result Value Ref Range Status   MRSA by PCR NEGATIVE NEGATIVE Final    Comment:        The GeneXpert MRSA Assay (FDA approved for NASAL specimens only), is one component of a comprehensive MRSA colonization surveillance program. It is not intended to diagnose MRSA infection nor to guide or monitor treatment for MRSA infections. Performed at [facility], [address], [place], [place] [address]   Culture, blood (Routine X 2) w Reflex to ID Panel      Status: None (Preliminary result)   Collection Time: 10/28/17  5:33 AM  Result Value Ref Range Status   Specimen Description   Final    BLOOD LEFT ANTECUBITAL Performed at [facility], [address] [place], [place] [address]    Special Requests   Final    BOTTLES DRAWN AEROBIC AND ANAEROBIC Blood Culture adequate volume Performed at [facility], [address] [place], [place] [address]    Culture   Final    NO GROWTH < 24 HOURS Performed at [facility], [address] [place], [place] [address]    Report Status PENDING  Incomplete         Radiology Studies: No results found.      Scheduled Meds: . insulin aspart  0-15 Units Subcutaneous TID WC  . insulin aspart  0-5 Units Subcutaneous QHS  . insulin glargine  15 Units Subcutaneous BID  . simvastatin  40 mg Oral QPM  . sodium chloride flush  3 mL Intravenous Q12H  . Warfarin - Pharmacist Dosing Inpatient   Does not apply q1800   Continuous Infusions: . ampicillin (OMNIPEN) IV 2 g (10/29/17 0735)     LOS: 3 days    Time spent: 35    Delaine Lame, MD [facility]  If 7PM-7AM, please contact night-coverage [email or web address] Password [email or web address] 10/29/2017, 10:51 AM

## 2017-10-29 NOTE — Care Management Note (Signed)
Case Management Note  Patient Details  Name: [name] MRN: [id number] Date of Birth: [birth year]  Subjective/Objective: Per attending note-oral abx, & SNF.CSW following for SNF.                   Action/Plan:d/c SNF.   Expected Discharge Date:  (unknown)               Expected Discharge Plan:  Skilled Nursing Facility  In-House Referral:  Clinical Social Work  Discharge planning Services  CM Consult  Post Acute Care Choice:    Choice offered to:     DME Arranged:    DME Agency:     HH Arranged:    HH Agency:     Status of Service:  Completed, signed off  If discussed at [employer], dates discussed:    Additional Comments:  Lanier Clam, RN 10/29/2017, 3:33 PM

## 2017-10-29 NOTE — Progress Notes (Signed)
Patient ID: [name], male   DOB: 1940-11-24, 77 y.o.   MRN: [id number] [name] is scheduled for ureteroscopy next Thursday.     I have placed an order for a repeat INR for next Tuesday prior to his discharge since he was going to have that done as an outpatient prior to the procedure.

## 2017-10-29 NOTE — Progress Notes (Signed)
INFECTIOUS DISEASE PROGRESS NOTE  ID: [name] is a 77 y.o. male with  Principal Problem:   Severe sepsis with acute organ dysfunction (HCC) Active Problems:   HTN (hypertension)   DM II (diabetes mellitus, type II), controlled (HCC)   Closed compression fracture of body of lumbar vertebra (HCC)   AF (atrial fibrillation) (HCC)   Hyperlipidemia   History of stroke   Presence of inferior vena cava filter   Right ureteral stone   Sepsis (HCC)   ARF (acute renal failure) (HCC)   Metabolic acidosis   Acute pyelonephritis   Acute lower UTI   Status post placement of ureteral stent:R 10/15/2017   Dehydration  Subjective: Resting quietly.   Abtx:  Anti-infectives (From admission, onward)   Start     Dose/Rate Route Frequency Ordered Stop   10/28/17 1600  ampicillin (OMNIPEN) 2 g in sodium chloride 0.9 % 100 mL IVPB     2 g 300 mL/hr over 20 Minutes Intravenous Every 4 hours 10/28/17 1301     10/28/17 1200  vancomycin (VANCOCIN) 1,500 mg in sodium chloride 0.9 % 500 mL IVPB  Status:  Discontinued     1,500 mg 250 mL/hr over 120 Minutes Intravenous Every 48 hours 10/26/17 1705 10/27/17 0926   10/27/17 1200  ampicillin (OMNIPEN) 2 g in sodium chloride 0.9 % 100 mL IVPB  Status:  Discontinued     2 g 300 mL/hr over 20 Minutes Intravenous Every 6 hours 10/27/17 0928 10/28/17 1301   10/27/17 1000  ceFEPIme (MAXIPIME) 2 g in sodium chloride 0.9 % 100 mL IVPB  Status:  Discontinued     2 g 200 mL/hr over 30 Minutes Intravenous Every 24 hours 10/26/17 1705 10/27/17 0926   10/26/17 1645  ceFEPIme (MAXIPIME) 2 g in sodium chloride 0.9 % 100 mL IVPB  Status:  Discontinued     2 g 200 mL/hr over 30 Minutes Intravenous  Once 10/26/17 1637 10/26/17 1642   10/26/17 1645  vancomycin (VANCOCIN) IVPB 1000 mg/200 mL premix  Status:  Discontinued     1,000 mg 200 mL/hr over 60 Minutes Intravenous  Once 10/26/17 1637 10/26/17 1642   10/26/17 1230  ceFEPIme (MAXIPIME) 2 g in sodium chloride  0.9 % 100 mL IVPB     2 g 200 mL/hr over 30 Minutes Intravenous  Once 10/26/17 1216 10/26/17 1316   10/26/17 1230  vancomycin (VANCOCIN) 1,500 mg in sodium chloride 0.9 % 500 mL IVPB     1,500 mg 250 mL/hr over 120 Minutes Intravenous  Once 10/26/17 1216 10/26/17 1607      Medications:  Scheduled: . insulin aspart  0-15 Units Subcutaneous TID WC  . insulin aspart  0-5 Units Subcutaneous QHS  . insulin glargine  15 Units Subcutaneous BID  . simvastatin  40 mg Oral QPM  . sodium chloride flush  3 mL Intravenous Q12H  . Warfarin - Pharmacist Dosing Inpatient   Does not apply q1800    Objective: Vital signs in last 24 hours: Temp:  [98.2 F (36.8 C)-98.4 F (36.9 C)] 98.4 F (36.9 C) (10/25 0534) Pulse Rate:  [83-84] 83 (10/25 0534) Resp:  [20-26] 24 (10/25 0534) BP: (145-155)/(74-89) 145/74 (10/25 0534) SpO2:  [93 %-97 %] 93 % (10/25 0850) Weight:  [81.7 kg] 81.7 kg (10/25 0534)   General appearance: no distress Resp: clear to auscultation bilaterally Cardio: regular rate and rhythm GI: normal findings: bowel sounds normal and soft, non-tender and abnormal findings:  distended  Lab  Results Recent Labs    10/28/17 0531 10/29/17 0523  WBC 12.7* 12.7*  HGB 13.4 13.0  HCT 40.5 39.7  NA 136 133*  K 3.9 3.7  CL 105 103  CO2 22 20*  BUN 27* 26*  CREATININE 1.04 0.88   Liver Panel Recent Labs    10/26/17 1201 10/28/17 0531  PROT 7.2 5.6*  ALBUMIN 3.7 2.4*  AST 97* 62*  ALT 41 46*  ALKPHOS 60 44  BILITOT 1.2 1.0  BILIDIR 0.4*  --   IBILI 0.8  --    Sedimentation Rate No results for input(s): ESRSEDRATE in the last 72 hours. C-Reactive Protein No results for input(s): CRP in the last 72 hours.  Microbiology: Recent Results (from the past 240 hour(s))  Blood culture (routine x 2)     Status: Abnormal   Collection Time: 10/26/17 12:34 PM  Result Value Ref Range Status   Specimen Description   Final    BLOOD RIGHT ARM Performed at [facility], [address] [place], [place] [address]    Special Requests   Final    BOTTLES DRAWN AEROBIC AND ANAEROBIC Blood Culture adequate volume Performed at [facility], [address], [place], [place] [address]    Culture  Setup Time   Final    GRAM POSITIVE COCCI IN BOTH AEROBIC AND ANAEROBIC BOTTLES CRITICAL RESULT CALLED TO, READ BACK BY AND VERIFIED WITH: PHARMD J LEGGE 10/27/17 AT 908 AM BY CM Performed at [facility], [address], [place], [place] [address]    Culture ENTEROCOCCUS FAECALIS (A)  Final   Report Status 10/29/2017 FINAL  Final   Organism ID, Bacteria ENTEROCOCCUS FAECALIS  Final      Susceptibility   Enterococcus faecalis - MIC*    AMPICILLIN <=2 SENSITIVE Sensitive     VANCOMYCIN 1 SENSITIVE Sensitive     GENTAMICIN SYNERGY SENSITIVE Sensitive     * ENTEROCOCCUS FAECALIS  Blood Culture ID Panel (Reflexed)     Status: Abnormal   Collection Time: 10/26/17 12:34 PM  Result Value Ref Range Status   Enterococcus species DETECTED (A) NOT DETECTED Final    Comment: CRITICAL RESULT CALLED TO, READ BACK BY AND VERIFIED WITH: PHARMD J LEGGE 10/27/17 AT 908 AM BY CM    Vancomycin resistance NOT DETECTED NOT DETECTED Final   Listeria monocytogenes NOT DETECTED NOT DETECTED Final   Staphylococcus species NOT DETECTED NOT DETECTED Final   Staphylococcus aureus (BCID) NOT DETECTED NOT DETECTED Final   Streptococcus species NOT DETECTED NOT DETECTED Final   Streptococcus agalactiae NOT DETECTED NOT DETECTED Final   Streptococcus pneumoniae NOT DETECTED NOT DETECTED Final   Streptococcus pyogenes NOT DETECTED NOT DETECTED Final   Acinetobacter baumannii NOT DETECTED NOT DETECTED Final   Enterobacteriaceae species NOT DETECTED NOT DETECTED Final   Enterobacter cloacae complex NOT DETECTED NOT DETECTED Final   Escherichia coli NOT DETECTED NOT DETECTED Final   Klebsiella oxytoca NOT DETECTED NOT DETECTED Final   Klebsiella pneumoniae  NOT DETECTED NOT DETECTED Final   Proteus species NOT DETECTED NOT DETECTED Final   Serratia marcescens NOT DETECTED NOT DETECTED Final   Haemophilus influenzae NOT DETECTED NOT DETECTED Final   Neisseria meningitidis NOT DETECTED NOT DETECTED Final   Pseudomonas aeruginosa NOT DETECTED NOT DETECTED Final   Candida albicans NOT DETECTED NOT DETECTED Final   Candida glabrata NOT DETECTED NOT DETECTED Final   Candida krusei NOT DETECTED NOT DETECTED Final   Candida parapsilosis  NOT DETECTED NOT DETECTED Final   Candida tropicalis NOT DETECTED NOT DETECTED Final    Comment: Performed at [facility], [address] [place], [place] [address]  Blood culture (routine x 2)     Status: None (Preliminary result)   Collection Time: 10/26/17 12:45 PM  Result Value Ref Range Status   Specimen Description   Final    BLOOD LEFT ARM Performed at [facility], [address] [place], [place] [address]    Special Requests   Final    BOTTLES DRAWN AEROBIC AND ANAEROBIC Blood Culture results may not be optimal due to an excessive volume of blood received in culture bottles Performed at [facility], [address], [place], [place] [address]    Culture   Final    NO GROWTH 3 DAYS Performed at [facility], [address], [place], [place] [address]    Report Status PENDING  Incomplete  Urine culture     Status: Abnormal   Collection Time: 10/26/17 12:59 PM  Result Value Ref Range Status   Specimen Description   Final    URINE, RANDOM Performed at [facility], [address] [place], [place] [address]    Special Requests   Final    NONE Performed at [facility], [address] [place], [place] [address]    Culture MULTIPLE SPECIES PRESENT, SUGGEST RECOLLECTION (A)  Final   Report Status 10/27/2017 FINAL  Final  MRSA PCR Screening     Status: None   Collection Time: 10/26/17  5:44 PM  Result Value Ref  Range Status   MRSA by PCR NEGATIVE NEGATIVE Final    Comment:        The GeneXpert MRSA Assay (FDA approved for NASAL specimens only), is one component of a comprehensive MRSA colonization surveillance program. It is not intended to diagnose MRSA infection nor to guide or monitor treatment for MRSA infections. Performed at [facility], [address] [place], [place] [address]   Culture, blood (Routine X 2) w Reflex to ID Panel     Status: None (Preliminary result)   Collection Time: 10/28/17  5:33 AM  Result Value Ref Range Status   Specimen Description   Final    BLOOD LEFT ANTECUBITAL Performed at [facility], [address] [place], [place] [address]    Special Requests   Final    BOTTLES DRAWN AEROBIC AND ANAEROBIC Blood Culture adequate volume Performed at [facility], [address] [place], [place] [address]    Culture   Final    NO GROWTH < 24 HOURS Performed at [facility], [address], [place], [place] [address]    Report Status PENDING  Incomplete    Studies/Results: No results found.   Assessment/Plan: Enterococcal bacteremia DM2 with hyperglycemia, acidosis UPJ stent 10-16-17 AKI (improving)  Total days of antibiotics: 3 amp  Repeat BCx (10-24) ngtd Needs TEE. TTE showed calcified Ao leaflets.  Urology f/u Glc improving, CO2 improved.         Johny Sax MD, FACP Infectious Diseases (pager) [phone] [email or web address] 10/29/2017, 10:24 AM  LOS: 3 days

## 2017-10-29 NOTE — Progress Notes (Signed)
ANTICOAGULATION CONSULT NOTE   Pharmacy Consult for Warfarin Indication: atrial fibrillation, pulmonary embolus, DVT and stroke  Allergies  Allergen Reactions  . Prednisone     Other reaction(s): Other (See Comments) unknown  . Zithromax [Azithromycin] Other (See Comments)    Weak and flulike symptoms    Patient Measurements: Height: 5\' 4"  (162.6 cm) Weight: 180 lb 1.9 oz (81.7 kg) IBW/kg (Calculated) : 59.2  Vital Signs: Temp: 98.2 F (36.8 C) (10/25 1320) Temp Source: Oral (10/25 1320) BP: 147/74 (10/25 1320) Pulse Rate: 79 (10/25 1320)  Labs: Recent Labs    10/26/17 1746  10/27/17 0554 10/28/17 0531 10/29/17 0523  HGB  --    < > 14.1 13.4 13.0  HCT  --   --  43.5 40.5 39.7  PLT  --   --  118* 132* 123*  APTT 33  --   --   --   --   LABPROT  --   --  44.6* 45.3* 47.7*  INR  --   --  4.86* 4.96* 5.29*  CREATININE 1.34*  --  1.27* 1.04 0.88   < > = values in this interval not displayed.    Estimated Creatinine Clearance: 68.9 mL/min (by C-G formula based on SCr of 0.88 mg/dL).   Medications:  Scheduled:  . sodium chloride   Intravenous Once  . furosemide  20 mg Intravenous Once  . insulin aspart  0-15 Units Subcutaneous TID WC  . insulin aspart  0-5 Units Subcutaneous QHS  . insulin glargine  15 Units Subcutaneous BID  . simvastatin  40 mg Oral QPM  . sodium chloride flush  3 mL Intravenous Q12H  . Warfarin - Pharmacist Dosing Inpatient   Does not apply q1800   Infusions:  . ampicillin (OMNIPEN) IV 2 g (10/29/17 1209)    Assessment: [name] admitted 10/22 with urosepsis.  He is on warfarin PTA for PE/DVT 2002, Afib and stroke 2002.  Pharmacy consulted to dose warfarin.  PTA warfarin dose 3 mg daily except 2 mg on Mon/Fri.  Last dose 10/21. Admission INR 3.75  Today, 10/29/2017:  INR = 5.3 remains supratherapeutic and has increased despite no warfarin since 10/22  Hgb 13 is stable  Plt 123 low but stable  No bleeding or complications noted. No  signs of bleeding per RN.  Pt receiving 2 units of FFP today  Diet: carb modified  No major drug drug interactions.  Broad spectrum antibiotics may prolong INR.  Goal of Therapy:  INR 2-3 Monitor platelets by anticoagulation protocol: Yes   Plan:   Continue to HOLD warfarin for elevated INR.  Daily PT/INR.  Monitor for signs and symptoms of bleeding.  Cindi Carbon, PharmD 10/29/17 1:29 PM

## 2017-10-30 LAB — CBC
HCT: 33.7 % — ABNORMAL LOW (ref 39.0–52.0)
Hemoglobin: 11.1 g/dL — ABNORMAL LOW (ref 13.0–17.0)
MCH: 30.3 pg (ref 26.0–34.0)
MCHC: 32.9 g/dL (ref 30.0–36.0)
MCV: 92.1 fL (ref 80.0–100.0)
Platelets: 141 K/uL — ABNORMAL LOW (ref 150–400)
RBC: 3.66 MIL/uL — ABNORMAL LOW (ref 4.22–5.81)
RDW: 14.2 % (ref 11.5–15.5)
WBC: 12.7 K/uL — ABNORMAL HIGH (ref 4.0–10.5)
nRBC: 0 % (ref 0.0–0.2)

## 2017-10-30 LAB — GLUCOSE, CAPILLARY
GLUCOSE-CAPILLARY: 181 mg/dL — AB (ref 70–99)
GLUCOSE-CAPILLARY: 182 mg/dL — AB (ref 70–99)
Glucose-Capillary: 168 mg/dL — ABNORMAL HIGH (ref 70–99)

## 2017-10-30 LAB — DIC (DISSEMINATED INTRAVASCULAR COAGULATION)PANEL
D-Dimer, Quant: 3.88 ug{FEU}/mL — ABNORMAL HIGH (ref 0.00–0.50)
Fibrinogen: 688 mg/dL — ABNORMAL HIGH (ref 210–475)
INR: 3.24
Platelets: 138 10*3/uL — ABNORMAL LOW (ref 150–400)
Prothrombin Time: 32.6 s — ABNORMAL HIGH (ref 11.4–15.2)
Smear Review: NONE SEEN
aPTT: 44 seconds — ABNORMAL HIGH (ref 24–36)

## 2017-10-30 LAB — PROTIME-INR
INR: 3.22
Prothrombin Time: 32.4 seconds — ABNORMAL HIGH (ref 11.4–15.2)

## 2017-10-30 NOTE — Progress Notes (Signed)
PROGRESS NOTE    [name]  WUJ:[id number] DOB: 23-Sep-1940 DOA: 10/26/2017 PCP: [name], MD   Brief Narrative:  77 year old with past medical history relevant for DVT/PE status post IVC filter placement, paroxysmal atrial fibrillation on warfarin, hypertension, type 2 diabetes not on insulin, hyperlipidemia, nephrolithiasis status post right UPJ stent placement on 10/16/2017 for hydronephrosis , cirrhosis, pancreatic cyst (last MRI of abdomen on 05/29/2016 favors benign, repeat in 2 years), indeterminate liver lesion (MRI of abdomen on 05/29/2016 recommends repeat MRI in 3 months) who presents with profound weakness, sepsis, AKI found to have right-sided pyelonephritis with enterococcus bacteremia.   Assessment & Plan:   Principal Problem:   Severe sepsis with acute organ dysfunction (HCC) Active Problems:   HTN (hypertension)   DM II (diabetes mellitus, type II), controlled (HCC)   Closed compression fracture of body of lumbar vertebra (HCC)   AF (atrial fibrillation) (HCC)   Hyperlipidemia   History of stroke   Presence of inferior vena cava filter   Right ureteral stone   Sepsis (HCC)   ARF (acute renal failure) (HCC)   Metabolic acidosis   Acute pyelonephritis   Acute lower UTI   Status post placement of ureteral stent:R 10/15/2017   Dehydration   #) Enterococcus sepsis due to pyelonephritis: CT abdomen pelvis on admission showed evidence of pyelonephritis on the right.  White blood cell count continue to be elevated however repeat cultures are no growth to date.  This patient needs an MRI of his abdomen for this liver lesion (see below) will obtain MRI of the abdomen as he has persistent elevated white count and some abdominal pain -Continue IV ampicillin started 10/27/2017 - blood cultures on 10/28/2017 no growth to date - Blood cultures from 10/26/2017 growing enterococcus -Echo on 10/27/2017 showed only grade 1 diastolic dysfunction -Infectious disease is  recommending TEE, pending for 11/01/2017 -MRI abdomen with and without contrast ordered  #) Nephrolithiasis status post right UPJ stent on 10/16/2017: Repeat CT imaging on admission shows that renal pelvis is decompressed. -Discussed with urology, they recommend treating infection and then they will as an outpatient continue treating stones  #) AKI: Resolved  #) DVT/PE/atrial fibrillation: INR is improved with 2 units of FFP -Continue warfarin, pharmacy consult -We will recheck tomorrow and consider additional units if it does not continue to downtrend -DIC panel shows elevated fibrinogen  #) Type 2 diabetes: Patient is only on home oral hypoglycemics however his blood sugars here been quite elevated so patient was started on insulin - Continue glargine to 15 units twice daily -Hold home glimepiride 4 mg every morning -Hold home glipizide 2.5 mg daily -Hold home metformin 500 mg twice daily  #) Cirrhosis/indeterminate liver lesion: Per MRI done on 05/29/2016 patient was noted to have cirrhosis and indeterminate liver lesion with recommendation for repeat MRI in 3 months.  This was not performed. -Repeat MRI of the abdomen pending  #) Hypertension/hyperlipidemia: -Hold home losartan 100 mg every morning -Continue simvastatin 40 mg nightly  #) Pancreatic lesion: Patient noted to have unusual necrotic lesion in the tail of the pancreas.  MRI on 05/29/2016.  Benign etiology with recommendation to repeat in 2 years. - Plan for repeat MRI in 2 years, approximately 05/2018  Fluids: Tolerating p.o. Electrolytes: Monitor and supplement Nutrition: Carb restricted diet  Prophylaxis: Warfarin  Disposition: Pending oral antibiotics and skilled nursing facility placement  Full code   Consultants:   Urology  Procedures:   None  Antimicrobials:  IV ampicillin  started 10/27/2017   Subjective: Patient reports he is doing about the same as yesterday.  He reports he is having bowel  movements and no nausea but he does report some abdominal pain.  He reports that this pain is chronically did not notify this Clinical research associate yesterday.  He denies any cough, congestion, rhinorrhea.  Objective: Vitals:   10/29/17 1515 10/29/17 1620 10/29/17 2106 10/30/17 0547  BP: (!) 146/77 (!) 151/78 127/73 128/69  Pulse: 77 79 80 68  Resp: 20 18 18 18   Temp: 98.1 F (36.7 C) 98.2 F (36.8 C) 98.6 F (37 C) 98 F (36.7 C)  TempSrc: Oral Oral  Oral  SpO2: 96% 95% 95% 95%  Weight:      Height:        Intake/Output Summary (Last 24 hours) at 10/30/2017 1027 Last data filed at 10/30/2017 1000 Gross per 24 hour  Intake 2765.34 ml  Output 2675 ml  Net 90.34 ml   Filed Weights   10/27/17 0500 10/28/17 0500 10/29/17 0534  Weight: 75.9 kg 80.6 kg 81.7 kg    Examination:  General exam: Appears calm and comfortable  Respiratory system: Clear to auscultation. Respiratory effort normal. Cardiovascular system: Regular rate and rhythm, no murmurs. Gastrointestinal system: Soft, nondistended, mildly tender to palpation, no rebound or guarding, plus bowel sounds. Central nervous system: Alert and oriented. No focal neurological deficits. Extremities: Trace lower extremity edema Skin: No rashes over visible skin Psychiatry: Judgement and insight appear normal. Mood & affect appropriate.     Data Reviewed: I have personally reviewed following labs and imaging studies  CBC: Recent Labs  Lab 10/26/17 1201 10/27/17 0554 10/28/17 0531 10/29/17 0523 10/29/17 1833 10/30/17 0515  WBC 23.6* 17.7* 12.7* 12.7*  --  12.7*  HGB 14.9 14.1 13.4 13.0  --  11.1*  HCT 45.8 43.5 40.5 39.7  --  33.7*  MCV 94.8 92.9 92.5 92.8  --  92.1  PLT 155 118* 132* 123* 138* 141*   Basic Metabolic Panel: Recent Labs  Lab 10/26/17 1201 10/26/17 1746 10/27/17 0554 10/28/17 0531 10/29/17 0523  NA 134* 135 136 136 133*  K 4.3 4.1 4.7 3.9 3.7  CL 101 105 104 105 103  CO2 16* 20* 21* 22 20*  GLUCOSE 415*  327* 317* 241* 188*  BUN 34* 32* 34* 27* 26*  CREATININE 1.71* 1.34* 1.27* 1.04 0.88  CALCIUM 8.8* 8.3* 7.9* 7.5* 7.4*  MG  --  1.8  --  1.9  --    GFR: Estimated Creatinine Clearance: 68.9 mL/min (by C-G formula based on SCr of 0.88 mg/dL). Liver Function Tests: Recent Labs  Lab 10/26/17 1201 10/28/17 0531  AST 97* 62*  ALT 41 46*  ALKPHOS 60 44  BILITOT 1.2 1.0  PROT 7.2 5.6*  ALBUMIN 3.7 2.4*   Recent Labs  Lab 10/26/17 1201  LIPASE 21   No results for input(s): AMMONIA in the last 168 hours. Coagulation Profile: Recent Labs  Lab 10/27/17 0554 10/28/17 0531 10/29/17 0523 10/29/17 1833 10/30/17 0515  INR 4.86* 4.96* 5.29* 3.24 3.22   Cardiac Enzymes: No results for input(s): CKTOTAL, CKMB, CKMBINDEX, TROPONINI in the last 168 hours. BNP (last 3 results) No results for input(s): PROBNP in the last 8760 hours. HbA1C: No results for input(s): HGBA1C in the last 72 hours. CBG: Recent Labs  Lab 10/29/17 0807 10/29/17 1148 10/29/17 1631 10/29/17 2102 10/30/17 0721  GLUCAP 178* 161* 148* 141* 181*   Lipid Profile: No results for input(s): CHOL, HDL, LDLCALC,  TRIG, CHOLHDL, LDLDIRECT in the last 72 hours. Thyroid Function Tests: No results for input(s): TSH, T4TOTAL, FREET4, T3FREE, THYROIDAB in the last 72 hours. Anemia Panel: No results for input(s): VITAMINB12, FOLATE, FERRITIN, TIBC, IRON, RETICCTPCT in the last 72 hours. Sepsis Labs: Recent Labs  Lab 10/26/17 1223 10/26/17 1420 10/26/17 1746 10/27/17 0554 10/28/17 0531  PROCALCITON  --   --  1.61 1.95 1.32  LATICACIDVEN 7.15* 6.73*  --  2.5*  --     Recent Results (from the past 240 hour(s))  Blood culture (routine x 2)     Status: Abnormal   Collection Time: 10/26/17 12:34 PM  Result Value Ref Range Status   Specimen Description   Final    BLOOD RIGHT ARM Performed at [facility], [address] [place], [place] [address]    Special Requests   Final    BOTTLES DRAWN  AEROBIC AND ANAEROBIC Blood Culture adequate volume Performed at [facility], [address] [place], [place] [address]    Culture  Setup Time   Final    GRAM POSITIVE COCCI IN BOTH AEROBIC AND ANAEROBIC BOTTLES CRITICAL RESULT CALLED TO, READ BACK BY AND VERIFIED WITH: PHARMD J LEGGE 10/27/17 AT 908 AM BY CM Performed at [facility], [address] [place], [place] [address]    Culture ENTEROCOCCUS FAECALIS (A)  Final   Report Status 10/29/2017 FINAL  Final   Organism ID, Bacteria ENTEROCOCCUS FAECALIS  Final      Susceptibility   Enterococcus faecalis - MIC*    AMPICILLIN <=2 SENSITIVE Sensitive     VANCOMYCIN 1 SENSITIVE Sensitive     GENTAMICIN SYNERGY SENSITIVE Sensitive     * ENTEROCOCCUS FAECALIS  Blood Culture ID Panel (Reflexed)     Status: Abnormal   Collection Time: 10/26/17 12:34 PM  Result Value Ref Range Status   Enterococcus species DETECTED (A) NOT DETECTED Final    Comment: CRITICAL RESULT CALLED TO, READ BACK BY AND VERIFIED WITH: PHARMD J LEGGE 10/27/17 AT 908 AM BY CM    Vancomycin resistance NOT DETECTED NOT DETECTED Final   Listeria monocytogenes NOT DETECTED NOT DETECTED Final   Staphylococcus species NOT DETECTED NOT DETECTED Final   Staphylococcus aureus (BCID) NOT DETECTED NOT DETECTED Final   Streptococcus species NOT DETECTED NOT DETECTED Final   Streptococcus agalactiae NOT DETECTED NOT DETECTED Final   Streptococcus pneumoniae NOT DETECTED NOT DETECTED Final   Streptococcus pyogenes NOT DETECTED NOT DETECTED Final   Acinetobacter baumannii NOT DETECTED NOT DETECTED Final   Enterobacteriaceae species NOT DETECTED NOT DETECTED Final   Enterobacter cloacae complex NOT DETECTED NOT DETECTED Final   Escherichia coli NOT DETECTED NOT DETECTED Final   Klebsiella oxytoca NOT DETECTED NOT DETECTED Final   Klebsiella pneumoniae NOT DETECTED NOT DETECTED Final   Proteus species NOT DETECTED NOT DETECTED Final   Serratia  marcescens NOT DETECTED NOT DETECTED Final   Haemophilus influenzae NOT DETECTED NOT DETECTED Final   Neisseria meningitidis NOT DETECTED NOT DETECTED Final   Pseudomonas aeruginosa NOT DETECTED NOT DETECTED Final   Candida albicans NOT DETECTED NOT DETECTED Final   Candida glabrata NOT DETECTED NOT DETECTED Final   Candida krusei NOT DETECTED NOT DETECTED Final   Candida parapsilosis NOT DETECTED NOT DETECTED Final   Candida tropicalis NOT DETECTED NOT DETECTED Final    Comment: Performed at [facility], [address] [place], [place] [address]  Blood culture (routine x 2)  Status: None (Preliminary result)   Collection Time: 10/26/17 12:45 PM  Result Value Ref Range Status   Specimen Description   Final    BLOOD LEFT ARM Performed at [facility], [address], [place], [place] [address]    Special Requests   Final    BOTTLES DRAWN AEROBIC AND ANAEROBIC Blood Culture results may not be optimal due to an excessive volume of blood received in culture bottles Performed at [facility], [address] [place], [place] [address]    Culture   Final    NO GROWTH 4 DAYS Performed at [facility], [address] [place], [place] [address]    Report Status PENDING  Incomplete  Urine culture     Status: Abnormal   Collection Time: 10/26/17 12:59 PM  Result Value Ref Range Status   Specimen Description   Final    URINE, RANDOM Performed at [facility], [address] [place], [place] [address]    Special Requests   Final    NONE Performed at [facility], [address] [place], [place] [address]    Culture MULTIPLE SPECIES PRESENT, SUGGEST RECOLLECTION (A)  Final   Report Status 10/27/2017 FINAL  Final  MRSA PCR Screening     Status: None   Collection Time: 10/26/17  5:44 PM  Result Value Ref Range Status   MRSA by PCR NEGATIVE NEGATIVE Final    Comment:        The GeneXpert MRSA  Assay (FDA approved for NASAL specimens only), is one component of a comprehensive MRSA colonization surveillance program. It is not intended to diagnose MRSA infection nor to guide or monitor treatment for MRSA infections. Performed at [facility], [address], [place], [place] [address]   Culture, blood (Routine X 2) w Reflex to ID Panel     Status: None (Preliminary result)   Collection Time: 10/28/17  5:33 AM  Result Value Ref Range Status   Specimen Description   Final    BLOOD LEFT ANTECUBITAL Performed at [facility], [address] [place], [place] [address]    Special Requests   Final    BOTTLES DRAWN AEROBIC AND ANAEROBIC Blood Culture adequate volume Performed at [facility], [address] [place], [place] [address]    Culture   Final    NO GROWTH 2 DAYS Performed at [facility], [address] [place], [place] [address]    Report Status PENDING  Incomplete         Radiology Studies: No results found.      Scheduled Meds: . sodium chloride   Intravenous Once  . insulin aspart  0-15 Units Subcutaneous TID WC  . insulin aspart  0-5 Units Subcutaneous QHS  . insulin glargine  15 Units Subcutaneous BID  . simvastatin  40 mg Oral QPM  . sodium chloride flush  3 mL Intravenous Q12H  . Warfarin - Pharmacist Dosing Inpatient   Does not apply q1800   Continuous Infusions: . ampicillin (OMNIPEN) IV 2 g (10/30/17 0730)     LOS: 4 days    Time spent: 35    Delaine Lame, MD [facility]  If 7PM-7AM, please contact night-coverage [email or web address] Password [facility] 10/30/2017, 10:27 AM

## 2017-10-30 NOTE — NC FL2 (Signed)
[place] MEDICAID FL2 LEVEL OF CARE SCREENING TOOL     IDENTIFICATION  Patient Name: [name] Birthdate: 22-Apr-1940 Sex: male Admission Date (Current Location): 10/26/2017  [facility] and [place] Number:  Producer, television/film/video and Address:  [facility],  [address] [place] [address]      Provider Number: [id number]  Attending Physician Name and Address:  Delaine Lame, MD  Relative Name and Phone Number:  Kizzie Fantasia: [phone]    Current Level of Care: Hospital Recommended Level of Care: Skilled Nursing Facility Prior Approval Number:    Date Approved/Denied:   PASRR Number: [id number]  Discharge Plan: SNF    Current Diagnoses: Patient Active Problem List   Diagnosis Date Noted  . Sepsis (HCC) 10/26/2017  . Severe sepsis with acute organ dysfunction (HCC) 10/26/2017  . ARF (acute renal failure) (HCC) 10/26/2017  . Metabolic acidosis 10/26/2017  . Acute pyelonephritis 10/26/2017  . Acute lower UTI 10/26/2017  . Status post placement of ureteral stent:R 10/15/2017 10/26/2017  . Dehydration 10/26/2017  . Right ureteral stone 10/15/2017  . Cirrhosis of liver (HCC) 06/07/2016  . Pancreatic cyst 06/07/2016  . AF (atrial fibrillation) (HCC) 06/07/2016  . Hyperlipidemia 06/07/2016  . History of stroke 06/07/2016  . Thrombocytopenia (HCC) 06/07/2016  . Closed compression fracture of L1 lumbar vertebra 05/28/2016  . HTN (hypertension) 05/28/2016  . DM II (diabetes mellitus, type II), controlled (HCC) 05/28/2016  . Closed compression fracture of body of lumbar vertebra (HCC) 05/28/2016  . Incisional hernia x 2.  Repaired 9/60/2013. 08/21/2011  . Presence of inferior vena cava filter 01/06/2000    Orientation RESPIRATION BLADDER Height & Weight     Self, Time, Situation, Place  Normal Indwelling catheter Weight: 180 lb 1.9 oz (81.7 kg) Height:  5\' 4"  (162.6 cm)  BEHAVIORAL SYMPTOMS/MOOD NEUROLOGICAL BOWEL NUTRITION STATUS      Continent  Diet(Carb modified)  AMBULATORY STATUS COMMUNICATION OF NEEDS Skin   Limited Assist Verbally Normal                       Personal Care Assistance Level of Assistance  Bathing, Feeding, Dressing Bathing Assistance: Maximum assistance Feeding assistance: Independent Dressing Assistance: Limited assistance     Functional Limitations Info  Sight, Hearing, Speech Sight Info: Adequate Hearing Info: Adequate Speech Info: Adequate    SPECIAL CARE FACTORS FREQUENCY  OT (By licensed OT), PT (By licensed PT)     PT Frequency: 5x/week OT Frequency: 5x/week            Contractures Contractures Info: Not present    Additional Factors Info  Code Status, Allergies Code Status Info: Full Allergies Info: PREDNISONE, ZITHROMAX AZITHROMYCIN            Current Medications (10/30/2017):  This is the current hospital active medication list Current Facility-Administered Medications  Medication Dose Route Frequency Provider Last Rate Last Dose  . 0.9 %  sodium chloride infusion (Manually program via Guardrails IV Fluids)   Intravenous Once Purohit, Shrey C, MD      . acetaminophen (TYLENOL) tablet 650 mg  650 mg Oral Q6H PRN Rodolph Bong, MD   650 mg at 10/28/17 1120   Or  . acetaminophen (TYLENOL) suppository 650 mg  650 mg Rectal Q6H PRN Rodolph Bong, MD      . ampicillin (OMNIPEN) 2 g in sodium chloride 0.9 % 100 mL IVPB  2 g Intravenous Q4H Cindi Carbon, RPH 300 mL/hr at  10/30/17 1130 2 g at 10/30/17 1130  . hydrALAZINE (APRESOLINE) injection 10 mg  10 mg Intravenous Q6H PRN Rodolph Bong, MD      . insulin aspart (novoLOG) injection 0-15 Units  0-15 Units Subcutaneous TID WC Rodolph Bong, MD   3 Units at 10/30/17 1200  . insulin aspart (novoLOG) injection 0-5 Units  0-5 Units Subcutaneous QHS Rodolph Bong, MD   3 Units at 10/27/17 2232  . insulin glargine (LANTUS) injection 15 Units  15 Units Subcutaneous BID Delaine Lame, MD   15 Units at  10/30/17 0848  . ondansetron (ZOFRAN) tablet 4 mg  4 mg Oral Q6H PRN Rodolph Bong, MD       Or  . ondansetron [facility]) injection 4 mg  4 mg Intravenous Q6H PRN Rodolph Bong, MD   4 mg at 10/28/17 1610  . oxyCODONE-acetaminophen (PERCOCET/ROXICET) 5-325 MG per tablet 1 tablet  1 tablet Oral Q6H PRN Rodolph Bong, MD   1 tablet at 10/30/17 0152  . senna-docusate (Senokot-S) tablet 1 tablet  1 tablet Oral QHS PRN Rodolph Bong, MD      . simvastatin (ZOCOR) tablet 40 mg  40 mg Oral QPM Rodolph Bong, MD   40 mg at 10/29/17 1754  . sodium chloride flush (NS) 0.9 % injection 3 mL  3 mL Intravenous Q12H Rodolph Bong, MD   3 mL at 10/30/17 0802  . sorbitol 70 % solution 30 mL  30 mL Oral Daily PRN Rodolph Bong, MD      . Warfarin - Pharmacist Dosing Inpatient   Does not apply q1800 Herby Abraham, [facility]   Stopped at 10/27/17 1800     Discharge Medications: Please see discharge summary for a list of discharge medications.  Relevant Imaging Results:  Relevant Lab Results:   Additional Information SSN: [id number]  Enid Cutter, LCSWA

## 2017-10-30 NOTE — Progress Notes (Signed)
ANTICOAGULATION CONSULT NOTE   Pharmacy Consult for Warfarin Indication: atrial fibrillation, pulmonary embolus, DVT and stroke  Allergies  Allergen Reactions  . Prednisone     Other reaction(s): Other (See Comments) unknown  . Zithromax [Azithromycin] Other (See Comments)    Weak and flulike symptoms    Patient Measurements: Height: 5\' 4"  (162.6 cm) Weight: 180 lb 1.9 oz (81.7 kg) IBW/kg (Calculated) : 59.2  Vital Signs: Temp: 98 F (36.7 C) (10/26 0547) Temp Source: Oral (10/26 0547) BP: 128/69 (10/26 0547) Pulse Rate: 68 (10/26 0547)  Labs: Recent Labs    10/28/17 0531 10/29/17 0523 10/29/17 1833 10/30/17 0515  HGB 13.4 13.0  --  11.1*  HCT 40.5 39.7  --  33.7*  PLT 132* 123* 138* 141*  APTT  --   --  44*  --   LABPROT 45.3* 47.7* 32.6* 32.4*  INR 4.96* 5.29* 3.24 3.22  CREATININE 1.04 0.88  --   --     Estimated Creatinine Clearance: 68.9 mL/min (by C-G formula based on SCr of 0.88 mg/dL).   Medications:  Scheduled:  . sodium chloride   Intravenous Once  . insulin aspart  0-15 Units Subcutaneous TID WC  . insulin aspart  0-5 Units Subcutaneous QHS  . insulin glargine  15 Units Subcutaneous BID  . simvastatin  40 mg Oral QPM  . sodium chloride flush  3 mL Intravenous Q12H  . Warfarin - Pharmacist Dosing Inpatient   Does not apply q1800   Infusions:  . ampicillin (OMNIPEN) IV 2 g (10/30/17 0730)    Assessment: [name] admitted 10/22 with urosepsis.  He is on warfarin PTA for PE/DVT 2002, Afib and stroke 2002.  Pharmacy consulted to dose warfarin.  PTA warfarin dose 3 mg daily except 2 mg on Mon/Fri.  Last dose 10/21. Admission INR 3.75  Today, 10/30/2017:  INR remains SUPRAtherapeutic but finally starting to trend downward  Hgb 11.1 is stable  Plt 141 low but stable  No bleeding or complications noted. No signs of bleeding per RN.  Pt receiving 2 units of FFP 10/25  Diet: carb modified  No major drug drug interactions.  Broad spectrum  antibiotics may prolong INR.  Goal of Therapy:  INR 2-3 Monitor platelets by anticoagulation protocol: Yes   Plan:   Continue to HOLD warfarin for elevated INR.  Daily PT/INR.  Monitor for signs and symptoms of bleeding.  Hessie Knows, PharmD, BCPS Pager [phone] 10/30/2017 9:39 AM

## 2017-10-30 NOTE — Clinical Social Work Note (Signed)
Clinical Social Work Assessment  Patient Details  Name: [name] MRN: [id number] Date of Birth: [birth year]  Date of referral:  10/28/17               Reason for consult:  Facility Placement                Permission sought to share information with:  Facility Art therapist granted to share information::  Yes, Verbal Permission Granted  Name::     Alben Deeds  Agency::  SNF  Relationship::  Friend  Contact Information:  [phone]  Housing/Transportation Living arrangements for the past 2 months:  [place] of Information:  Patient Patient Interpreter Needed:  None Criminal Activity/Legal Involvement Pertinent to Current Situation/Hospitalization:  No - Comment as needed Significant Relationships:  Friend Lives with:  Self Do you feel safe going back to the place where you live?  Yes Need for family participation in patient care:  No (Coment)  Care giving concerns:  Patient lives alone with no family support nearby. PT recommending SNF and will need short term therapy to regain strength before returning home.    Social Worker assessment / plan:  CSW met with patient at bedside to discuss discharge plan. Patient was trying to rest when CSW visited so conversation was kept brief.   Patient lives alone and has no family support nearby. He reports he has previously been to [facility] and prefers to return there. He is also agreeable to CSW sending referrals to other area facilities in case [facility] is unable to accept.   CSW will complete FL2 and send out referrals.   Employment status:  Retired Nurse, adult PT Recommendations:  [place] / Referral to community resources:  [place]  Patient/Family's Response to care:  Patient is appreciative of care and hopeful to go to [facility] SNF at d/c. He has had prior positive experiences there.   Patient/Family's  Understanding of and Emotional Response to Diagnosis, Current Treatment, and Prognosis:  Patient understands SNF referral process and [facility] insurance auth process. CSW explained [employer] will not authorize until at least Monday, patient reports understanding.   Emotional Assessment Appearance:  Appears stated age Attitude/Demeanor/Rapport:    Affect (typically observed):  Accepting, Appropriate Orientation:  Oriented to Self, Oriented to Place, Oriented to  Time, Oriented to Situation Alcohol / Substance use:  Not Applicable Psych involvement (Current and /or in the community):  Outpatient Provider  Discharge Needs  Concerns to be addressed:  Care Coordination Readmission within the last 30 days:  No Current discharge risk:  Physical Impairment Barriers to Discharge:  Continued Medical Work up   [employer], [place] 10/30/2017, 4:06 PM

## 2017-10-31 LAB — CBC
HCT: 36 % — ABNORMAL LOW (ref 39.0–52.0)
Hemoglobin: 11.9 g/dL — ABNORMAL LOW (ref 13.0–17.0)
MCH: 30.2 pg (ref 26.0–34.0)
MCHC: 33.1 g/dL (ref 30.0–36.0)
MCV: 91.4 fL (ref 80.0–100.0)
Platelets: 194 K/uL (ref 150–400)
RBC: 3.94 MIL/uL — ABNORMAL LOW (ref 4.22–5.81)
RDW: 14.2 % (ref 11.5–15.5)
WBC: 11.1 K/uL — ABNORMAL HIGH (ref 4.0–10.5)
nRBC: 0 % (ref 0.0–0.2)

## 2017-10-31 LAB — BASIC METABOLIC PANEL WITH GFR
Anion gap: 10 (ref 5–15)
CO2: 25 mmol/L (ref 22–32)
Calcium: 7.5 mg/dL — ABNORMAL LOW (ref 8.9–10.3)
Creatinine, Ser: 0.97 mg/dL (ref 0.61–1.24)
GFR calc Af Amer: >60 mL/min (ref >60)
GFR calc non Af Amer: >60 mL/min (ref >60)
Sodium: 139 mmol/L (ref 135–145)

## 2017-10-31 LAB — GLUCOSE, CAPILLARY
GLUCOSE-CAPILLARY: 124 mg/dL — AB (ref 70–99)
GLUCOSE-CAPILLARY: 149 mg/dL — AB (ref 70–99)
Glucose-Capillary: 163 mg/dL — ABNORMAL HIGH (ref 70–99)
Glucose-Capillary: 121 mg/dL — ABNORMAL HIGH (ref 70–99)

## 2017-10-31 LAB — CULTURE, BLOOD (ROUTINE X 2): Culture: NO GROWTH

## 2017-10-31 LAB — BASIC METABOLIC PANEL
BUN: 22 mg/dL (ref 8–23)
Chloride: 104 mmol/L (ref 98–111)
Glucose, Bld: 147 mg/dL — ABNORMAL HIGH (ref 70–99)
Potassium: 3.1 mmol/L — ABNORMAL LOW (ref 3.5–5.1)

## 2017-10-31 LAB — PROTIME-INR
INR: 3.59
Prothrombin Time: 35.3 seconds — ABNORMAL HIGH (ref 11.4–15.2)

## 2017-10-31 MED ORDER — TRAZODONE HCL 50 MG PO TABS
50.0000 mg | ORAL_TABLET | Freq: Once | ORAL | Status: AC
  Administered 2017-10-31: 50 mg via ORAL
  Filled 2017-10-31: qty 1

## 2017-10-31 MED ORDER — POTASSIUM CHLORIDE CRYS ER 20 MEQ PO TBCR
40.0000 meq | EXTENDED_RELEASE_TABLET | ORAL | Status: AC
  Administered 2017-10-31 (×2): 40 meq via ORAL
  Filled 2017-10-31 (×2): qty 2

## 2017-10-31 NOTE — Progress Notes (Signed)
PROGRESS NOTE    [name]  UJW:[id number] DOB: 10-25-1940 DOA: 10/26/2017 PCP: [name], MD   Brief Narrative:  77 year old with past medical history relevant for DVT/PE status post IVC filter placement, paroxysmal atrial fibrillation on warfarin, hypertension, type 2 diabetes not on insulin, hyperlipidemia, nephrolithiasis status post right UPJ stent placement on 10/16/2017 for hydronephrosis , cirrhosis, pancreatic cyst (last MRI of abdomen on 05/29/2016 favors benign, repeat in 2 years), indeterminate liver lesion (MRI of abdomen on 05/29/2016 recommends repeat MRI in 3 months) who presents with profound weakness, sepsis, AKI found to have right-sided pyelonephritis with enterococcus bacteremia.   Assessment & Plan:   Principal Problem:   Severe sepsis with acute organ dysfunction (HCC) Active Problems:   HTN (hypertension)   DM II (diabetes mellitus, type II), controlled (HCC)   Closed compression fracture of body of lumbar vertebra (HCC)   AF (atrial fibrillation) (HCC)   Hyperlipidemia   History of stroke   Presence of inferior vena cava filter   Right ureteral stone   Sepsis (HCC)   ARF (acute renal failure) (HCC)   Metabolic acidosis   Acute pyelonephritis   Acute lower UTI   Status post placement of ureteral stent:R 10/15/2017   Dehydration   #) Enterococcus sepsis due to pyelonephritis: CT abdomen pelvis on admission showed evidence of pyelonephritis on the right. -Continue IV ampicillin started 10/27/2017 - blood cultures on 10/28/2017 no growth to date - Blood cultures from 10/26/2017 growing enterococcus -Echo on 10/27/2017 showed only grade 1 diastolic dysfunction -Infectious disease is recommending TEE, pending for 11/01/2017 -MRI abdomen with and without contrast ordered to follow-up on liver lesion  #) Nephrolithiasis status post right UPJ stent on 10/16/2017: Repeat CT imaging on admission shows that renal pelvis is decompressed. -Discussed  with urology, they recommend treating infection and then they will as an outpatient continue treating stones  #) AKI: Resolved  #) DVT/PE/atrial fibrillation: INR is improved with 2 units of FFP -Continue warfarin, pharmacy consult -DIC panel shows elevated fibrinogen but no evidence of DIC  #) Type 2 diabetes: Patient is only on home oral hypoglycemics however his blood sugars here been quite elevated so patient was started on insulin - Continue glargine to 15 units twice daily -Hold home glimepiride 4 mg every morning -Hold home glipizide 2.5 mg daily -Hold home metformin 500 mg twice daily  #) Cirrhosis/indeterminate liver lesion: Per MRI done on 05/29/2016 patient was noted to have cirrhosis and indeterminate liver lesion with recommendation for repeat MRI in 3 months.  This was not performed. -Repeat MRI of the abdomen pending  #) Hypertension/hyperlipidemia: -Hold home losartan 100 mg every morning -Continue simvastatin 40 mg nightly  #) Pancreatic lesion: Patient noted to have unusual necrotic lesion in the tail of the pancreas.  MRI on 05/29/2016.  Benign etiology with recommendation to repeat in 2 years. - Plan for repeat MRI in 2 years, approximately 05/2018  Fluids: Tolerating p.o. Electrolytes: Monitor and supplement Nutrition: Carb restricted diet  Prophylaxis: Warfarin  Disposition: Pending oral antibiotics and skilled nursing facility placement  Full code   Consultants:   Urology  Procedures:   None  Antimicrobials:  IV ampicillin started 10/27/2017   Subjective: Patient reports he is doing doing somewhat better.  He denies any abdominal pain, nausea, vomiting, diarrhea, cough, congestion.  Objective: Vitals:   10/30/17 1403 10/30/17 2129 10/31/17 0500 10/31/17 0547  BP: 131/73 139/86  (!) 144/75  Pulse: 69 81  73  Resp: 20 18  18  Temp: 98.2 F (36.8 C) (!) 97.4 F (36.3 C)  98.2 F (36.8 C)  TempSrc: Oral Oral  Oral  SpO2: 96% 95%  96%    Weight:   79.9 kg   Height:        Intake/Output Summary (Last 24 hours) at 10/31/2017 0945 Last data filed at 10/31/2017 0807 Gross per 24 hour  Intake 3810 ml  Output 1550 ml  Net 2260 ml   Filed Weights   10/28/17 0500 10/29/17 0534 10/31/17 0500  Weight: 80.6 kg 81.7 kg 79.9 kg    Examination:  General exam: Appears calm and comfortable  Respiratory system: Clear to auscultation. Respiratory effort normal. Cardiovascular system: Regular rate and rhythm, no murmurs. Gastrointestinal system: Soft, nondistended, nontender to palpation, no rebound or guarding, plus bowel sounds. Central nervous system: Alert and oriented. No focal neurological deficits. Extremities: Trace lower extremity edema Skin: No rashes over visible skin Psychiatry: Judgement and insight appear normal. Mood & affect appropriate.     Data Reviewed: I have personally reviewed following labs and imaging studies  CBC: Recent Labs  Lab 10/27/17 0554 10/28/17 0531 10/29/17 0523 10/29/17 1833 10/30/17 0515 10/31/17 0538  WBC 17.7* 12.7* 12.7*  --  12.7* 11.1*  HGB 14.1 13.4 13.0  --  11.1* 11.9*  HCT 43.5 40.5 39.7  --  33.7* 36.0*  MCV 92.9 92.5 92.8  --  92.1 91.4  PLT 118* 132* 123* 138* 141* 194   Basic Metabolic Panel: Recent Labs  Lab 10/26/17 1746 10/27/17 0554 10/28/17 0531 10/29/17 0523 10/31/17 0538  NA 135 136 136 133* 139  K 4.1 4.7 3.9 3.7 3.1*  CL 105 104 105 103 104  CO2 20* 21* 22 20* 25  GLUCOSE 327* 317* 241* 188* 147*  BUN 32* 34* 27* 26* 22  CREATININE 1.34* 1.27* 1.04 0.88 0.97  CALCIUM 8.3* 7.9* 7.5* 7.4* 7.5*  MG 1.8  --  1.9  --   --    GFR: Estimated Creatinine Clearance: 61.9 mL/min (by C-G formula based on SCr of 0.97 mg/dL). Liver Function Tests: Recent Labs  Lab 10/26/17 1201 10/28/17 0531  AST 97* 62*  ALT 41 46*  ALKPHOS 60 44  BILITOT 1.2 1.0  PROT 7.2 5.6*  ALBUMIN 3.7 2.4*   Recent Labs  Lab 10/26/17 1201  LIPASE 21   No results for  input(s): AMMONIA in the last 168 hours. Coagulation Profile: Recent Labs  Lab 10/28/17 0531 10/29/17 0523 10/29/17 1833 10/30/17 0515 10/31/17 0538  INR 4.96* 5.29* 3.24 3.22 3.59   Cardiac Enzymes: No results for input(s): CKTOTAL, CKMB, CKMBINDEX, TROPONINI in the last 168 hours. BNP (last 3 results) No results for input(s): PROBNP in the last 8760 hours. HbA1C: No results for input(s): HGBA1C in the last 72 hours. CBG: Recent Labs  Lab 10/29/17 2102 10/30/17 0721 10/30/17 1118 10/30/17 2127 10/31/17 0741  GLUCAP 141* 181* 168* 182* 124*   Lipid Profile: No results for input(s): CHOL, HDL, LDLCALC, TRIG, CHOLHDL, LDLDIRECT in the last 72 hours. Thyroid Function Tests: No results for input(s): TSH, T4TOTAL, FREET4, T3FREE, THYROIDAB in the last 72 hours. Anemia Panel: No results for input(s): VITAMINB12, FOLATE, FERRITIN, TIBC, IRON, RETICCTPCT in the last 72 hours. Sepsis Labs: Recent Labs  Lab 10/26/17 1223 10/26/17 1420 10/26/17 1746 10/27/17 0554 10/28/17 0531  PROCALCITON  --   --  1.61 1.95 1.32  LATICACIDVEN 7.15* 6.73*  --  2.5*  --     Recent Results (from the  past 240 hour(s))  Blood culture (routine x 2)     Status: Abnormal   Collection Time: 10/26/17 12:34 PM  Result Value Ref Range Status   Specimen Description   Final    BLOOD RIGHT ARM Performed at [facility], [address], [place], [place] [address]    Special Requests   Final    BOTTLES DRAWN AEROBIC AND ANAEROBIC Blood Culture adequate volume Performed at [facility], [address], [place], [place] [address]    Culture  Setup Time   Final    GRAM POSITIVE COCCI IN BOTH AEROBIC AND ANAEROBIC BOTTLES CRITICAL RESULT CALLED TO, READ BACK BY AND VERIFIED WITH: PHARMD J LEGGE 10/27/17 AT 908 AM BY CM Performed at [facility], [address], [place], [place] [address]    Culture ENTEROCOCCUS FAECALIS (A)  Final   Report Status  10/29/2017 FINAL  Final   Organism ID, Bacteria ENTEROCOCCUS FAECALIS  Final      Susceptibility   Enterococcus faecalis - MIC*    AMPICILLIN <=2 SENSITIVE Sensitive     VANCOMYCIN 1 SENSITIVE Sensitive     GENTAMICIN SYNERGY SENSITIVE Sensitive     * ENTEROCOCCUS FAECALIS  Blood Culture ID Panel (Reflexed)     Status: Abnormal   Collection Time: 10/26/17 12:34 PM  Result Value Ref Range Status   Enterococcus species DETECTED (A) NOT DETECTED Final    Comment: CRITICAL RESULT CALLED TO, READ BACK BY AND VERIFIED WITH: PHARMD J LEGGE 10/27/17 AT 908 AM BY CM    Vancomycin resistance NOT DETECTED NOT DETECTED Final   Listeria monocytogenes NOT DETECTED NOT DETECTED Final   Staphylococcus species NOT DETECTED NOT DETECTED Final   Staphylococcus aureus (BCID) NOT DETECTED NOT DETECTED Final   Streptococcus species NOT DETECTED NOT DETECTED Final   Streptococcus agalactiae NOT DETECTED NOT DETECTED Final   Streptococcus pneumoniae NOT DETECTED NOT DETECTED Final   Streptococcus pyogenes NOT DETECTED NOT DETECTED Final   Acinetobacter baumannii NOT DETECTED NOT DETECTED Final   Enterobacteriaceae species NOT DETECTED NOT DETECTED Final   Enterobacter cloacae complex NOT DETECTED NOT DETECTED Final   Escherichia coli NOT DETECTED NOT DETECTED Final   Klebsiella oxytoca NOT DETECTED NOT DETECTED Final   Klebsiella pneumoniae NOT DETECTED NOT DETECTED Final   Proteus species NOT DETECTED NOT DETECTED Final   Serratia marcescens NOT DETECTED NOT DETECTED Final   Haemophilus influenzae NOT DETECTED NOT DETECTED Final   Neisseria meningitidis NOT DETECTED NOT DETECTED Final   Pseudomonas aeruginosa NOT DETECTED NOT DETECTED Final   Candida albicans NOT DETECTED NOT DETECTED Final   Candida glabrata NOT DETECTED NOT DETECTED Final   Candida krusei NOT DETECTED NOT DETECTED Final   Candida parapsilosis NOT DETECTED NOT DETECTED Final   Candida tropicalis NOT DETECTED NOT DETECTED Final     Comment: Performed at [facility], [address], [place], [place] [address]  Blood culture (routine x 2)     Status: None (Preliminary result)   Collection Time: 10/26/17 12:45 PM  Result Value Ref Range Status   Specimen Description   Final    BLOOD LEFT ARM Performed at [facility], [address] [place], [place] [address]    Special Requests   Final    BOTTLES DRAWN AEROBIC AND ANAEROBIC Blood Culture results may not be optimal due to an excessive volume of blood received in culture bottles Performed at [facility], [address] [place], [place] [address]    Culture   Final    NO GROWTH 4 DAYS Performed at [facility], [address] [place], [place] [address]    Report Status PENDING  Incomplete  Urine culture     Status: Abnormal   Collection Time: 10/26/17 12:59 PM  Result Value Ref Range Status   Specimen Description   Final    URINE, RANDOM Performed at [facility], [address] [place], [place] [address]    Special Requests   Final    NONE Performed at [facility], [address], [place], [place] [address]    Culture MULTIPLE SPECIES PRESENT, SUGGEST RECOLLECTION (A)  Final   Report Status 10/27/2017 FINAL  Final  MRSA PCR Screening     Status: None   Collection Time: 10/26/17  5:44 PM  Result Value Ref Range Status   MRSA by PCR NEGATIVE NEGATIVE Final    Comment:        The GeneXpert MRSA Assay (FDA approved for NASAL specimens only), is one component of a comprehensive MRSA colonization surveillance program. It is not intended to diagnose MRSA infection nor to guide or monitor treatment for MRSA infections. Performed at [facility], [address] [place], [place] [address]   Culture, blood (Routine X 2) w Reflex to ID Panel     Status: None (Preliminary result)   Collection Time: 10/28/17  5:33 AM  Result Value Ref Range Status    Specimen Description   Final    BLOOD LEFT ANTECUBITAL Performed at [facility], [address], [place], [place] [address]    Special Requests   Final    BOTTLES DRAWN AEROBIC AND ANAEROBIC Blood Culture adequate volume Performed at [facility], [address] [place], [place] [address]    Culture   Final    NO GROWTH 2 DAYS Performed at [facility], [address], [place], [place] [address]    Report Status PENDING  Incomplete         Radiology Studies: No results found.      Scheduled Meds: . sodium chloride   Intravenous Once  . insulin aspart  0-15 Units Subcutaneous TID WC  . insulin aspart  0-5 Units Subcutaneous QHS  . insulin glargine  15 Units Subcutaneous BID  . potassium chloride  40 mEq Oral Q4H  . simvastatin  40 mg Oral QPM  . sodium chloride flush  3 mL Intravenous Q12H  . Warfarin - Pharmacist Dosing Inpatient   Does not apply q1800   Continuous Infusions: . ampicillin (OMNIPEN) IV 2 g (10/31/17 0804)     LOS: 5 days    Time spent: 35    Delaine Lame, MD [facility]  If 7PM-7AM, please contact night-coverage [email or web address] Password [email or web address] 10/31/2017, 9:45 AM

## 2017-10-31 NOTE — Progress Notes (Signed)
Pharmacy Antibiotic Note  [name] is a 77 y.o. male admitted on 10/26/2017 with enterococcus bacteremia.  Pharmacy has been consulted for ampicillin dosing.  ID following. Patient diagnosed with enterococcal bacteremia s/p UPJ stent on 10/16/17.   Today, 10/31/17   Scr stable  WBC 11.1 trending down  Repeat blood cultures collected - ngtd  Day #5 of ampicillin  Plan:  Continue ampicillin 2 g IV q4h per ID  Will sign off  Height: 5\' 4"  (162.6 cm) Weight: 176 lb 3.2 oz (79.9 kg) IBW/kg (Calculated) : 59.2  Temp (24hrs), Avg:97.9 F (36.6 C), Min:97.4 F (36.3 C), Max:98.2 F (36.8 C)  Recent Labs  Lab 10/26/17 1223 10/26/17 1420 10/26/17 1746 10/27/17 0554 10/28/17 0531 10/29/17 0523 10/30/17 0515 10/31/17 0538  WBC  --   --   --  17.7* 12.7* 12.7* 12.7* 11.1*  CREATININE  --   --  1.34* 1.27* 1.04 0.88  --  0.97  LATICACIDVEN 7.15* 6.73*  --  2.5*  --   --   --   --     Estimated Creatinine Clearance: 61.9 mL/min (by C-G formula based on SCr of 0.97 mg/dL).    Allergies  Allergen Reactions  . Prednisone     Other reaction(s): Other (See Comments) unknown  . Zithromax [Azithromycin] Other (See Comments)    Weak and flulike symptoms    Antimicrobials this admission: ampicillin 10/23 >>  Vancomycin/cefepime 10/22  Dose adjustments this admission: 10/24 ampicillin 2 g IV q6h --> ampicillin 2 g IV q4h  Microbiology results: 10/22 BCx: Enterococcus faecalis 10/24 Repeat BCx: Sent 10/22 UCx: multiple species present  10/22 MRSA PCR: Negative 10/24 blood: ngtd  Thank you for allowing pharmacy to be a part of this patient's care. Hessie Knows, PharmD, BCPS  10/31/2017 9:54 AM

## 2017-10-31 NOTE — Progress Notes (Signed)
ID PROGRESS NOTE  Repeat blood cx from 10/24 are NGTD Remains afebrile, appears to have cleared enterococcal bacteremia Continue on ampicillin

## 2017-10-31 NOTE — Progress Notes (Signed)
ANTICOAGULATION CONSULT NOTE   Pharmacy Consult for Warfarin Indication: atrial fibrillation, pulmonary embolus, DVT and stroke  Allergies  Allergen Reactions  . Prednisone     Other reaction(s): Other (See Comments) unknown  . Zithromax [Azithromycin] Other (See Comments)    Weak and flulike symptoms    Patient Measurements: Height: 5\' 4"  (162.6 cm) Weight: 176 lb 3.2 oz (79.9 kg) IBW/kg (Calculated) : 59.2  Vital Signs: Temp: 98.2 F (36.8 C) (10/27 0547) Temp Source: Oral (10/27 0547) BP: 144/75 (10/27 0547) Pulse Rate: 73 (10/27 0547)  Labs: Recent Labs    10/29/17 0523 10/29/17 1833 10/30/17 0515 10/31/17 0538  HGB 13.0  --  11.1* 11.9*  HCT 39.7  --  33.7* 36.0*  PLT 123* 138* 141* 194  APTT  --  44*  --   --   LABPROT 47.7* 32.6* 32.4* 35.3*  INR 5.29* 3.24 3.22 3.59  CREATININE 0.88  --   --  0.97    Estimated Creatinine Clearance: 61.9 mL/min (by C-G formula based on SCr of 0.97 mg/dL).   Medications:  Scheduled:  . sodium chloride   Intravenous Once  . insulin aspart  0-15 Units Subcutaneous TID WC  . insulin aspart  0-5 Units Subcutaneous QHS  . insulin glargine  15 Units Subcutaneous BID  . potassium chloride  40 mEq Oral Q4H  . simvastatin  40 mg Oral QPM  . sodium chloride flush  3 mL Intravenous Q12H  . Warfarin - Pharmacist Dosing Inpatient   Does not apply q1800   Infusions:  . ampicillin (OMNIPEN) IV 2 g (10/31/17 0804)    Assessment: [name] admitted 10/22 with urosepsis.  He is on warfarin PTA for PE/DVT 2002, Afib and stroke 2002.  Pharmacy consulted to dose warfarin.  PTA warfarin dose 3 mg daily except 2 mg on Mon/Fri.  Last dose 10/21. Admission INR 3.75  Today, 10/31/2017:  INR remains SUPRAtherapeutic despite no doses of warfarin given since 10/22  CBC stable  No bleeding or complications noted. No signs of bleeding per RN.  Pt receiving 2 units of FFP 10/25  Diet: carb modified but patient not eating per RN  charting  No major drug drug interactions.  Broad spectrum antibiotics may prolong INR.  Goal of Therapy:  INR 2-3 Monitor platelets by anticoagulation protocol: Yes   Plan:   Continue to HOLD warfarin for elevated INR.  Daily PT/INR.  Monitor for signs and symptoms of bleeding.  Hessie Knows, PharmD, BCPS Pager [phone] 10/31/2017 9:55 AM

## 2017-11-01 ENCOUNTER — Inpatient Hospital Stay (HOSPITAL_COMMUNITY): Payer: Medicare Other

## 2017-11-01 LAB — GLUCOSE, CAPILLARY
GLUCOSE-CAPILLARY: 157 mg/dL — AB (ref 70–99)
GLUCOSE-CAPILLARY: 173 mg/dL — AB (ref 70–99)
Glucose-Capillary: 110 mg/dL — ABNORMAL HIGH (ref 70–99)
Glucose-Capillary: 111 mg/dL — ABNORMAL HIGH (ref 70–99)

## 2017-11-01 LAB — CBC
HCT: 33 % — ABNORMAL LOW (ref 39.0–52.0)
Hemoglobin: 10.8 g/dL — ABNORMAL LOW (ref 13.0–17.0)
MCH: 30.6 pg (ref 26.0–34.0)
MCHC: 32.7 g/dL (ref 30.0–36.0)
MCV: 93.5 fL (ref 80.0–100.0)
Platelets: 210 K/uL (ref 150–400)
RBC: 3.53 MIL/uL — ABNORMAL LOW (ref 4.22–5.81)
RDW: 14.6 % (ref 11.5–15.5)
WBC: 10 10*3/uL (ref 4.0–10.5)
nRBC: 0.2 % (ref 0.0–0.2)

## 2017-11-01 LAB — BASIC METABOLIC PANEL
Chloride: 109 mmol/L (ref 98–111)
Creatinine, Ser: 0.92 mg/dL (ref 0.61–1.24)
GFR calc Af Amer: >60 mL/min (ref >60)
Glucose, Bld: 128 mg/dL — ABNORMAL HIGH (ref 70–99)
Potassium: 4 mmol/L (ref 3.5–5.1)
Sodium: 140 mmol/L (ref 135–145)

## 2017-11-01 LAB — PREPARE FRESH FROZEN PLASMA

## 2017-11-01 LAB — BPAM FFP
Blood Product Expiration Date: 201910302359
Blood Product Expiration Date: 201910302359
ISSUE DATE / TIME: 201910251301
ISSUE DATE / TIME: 201910251453
Unit Type and Rh: 5100
Unit Type and Rh: 5100

## 2017-11-01 LAB — PROTIME-INR
INR: 3.63
PROTHROMBIN TIME: 35.6 s — AB (ref 11.4–15.2)

## 2017-11-01 LAB — BASIC METABOLIC PANEL WITH GFR
Anion gap: 8 (ref 5–15)
BUN: 17 mg/dL (ref 8–23)
CO2: 23 mmol/L (ref 22–32)
Calcium: 7.4 mg/dL — ABNORMAL LOW (ref 8.9–10.3)
GFR calc non Af Amer: >60 mL/min (ref >60)

## 2017-11-01 MED ORDER — GADOBUTROL 1 MMOL/ML IV SOLN
10.0000 mL | Freq: Once | INTRAVENOUS | Status: AC | PRN
  Administered 2017-11-01: 10 mL via INTRAVENOUS

## 2017-11-01 MED ORDER — AMOXICILLIN 250 MG PO CAPS
1000.0000 mg | ORAL_CAPSULE | Freq: Two times a day (BID) | ORAL | Status: DC
  Filled 2017-11-01: qty 4

## 2017-11-01 MED ORDER — SODIUM CHLORIDE 0.9 % IV SOLN
2.0000 g | INTRAVENOUS | Status: DC
  Administered 2017-11-01 – 2017-11-04 (×19): 2 g via INTRAVENOUS
  Filled 2017-11-01 (×3): qty 2
  Filled 2017-11-01: qty 2000
  Filled 2017-11-01 (×6): qty 2
  Filled 2017-11-01: qty 2000
  Filled 2017-11-01 (×5): qty 2
  Filled 2017-11-01: qty 2000
  Filled 2017-11-01 (×2): qty 2
  Filled 2017-11-01: qty 2000
  Filled 2017-11-01 (×2): qty 2
  Filled 2017-11-01: qty 2000

## 2017-11-01 NOTE — Progress Notes (Signed)
CSW met with patient at bedside to present bed offers and assist in discharge planning. Patient preferred [facility], but was unfortunately offered a bed. Patient stated he would then prefer the next closest facility to his home. After reviewing the list of bed offers, patient selected [facility], as it is close to his home.  CSW to contact [place] to [employer] authorization.  Stephanie Acre, [place] Social Worker [phone]

## 2017-11-01 NOTE — Progress Notes (Signed)
PROGRESS NOTE    [name]  GNF:[id number] DOB: 30-Sep-1940 DOA: 10/26/2017 PCP: [name], MD   Brief Narrative:  77 year old with past medical history relevant for DVT/PE status post IVC filter placement, paroxysmal atrial fibrillation on warfarin, hypertension, type 2 diabetes not on insulin, hyperlipidemia, nephrolithiasis status post right UPJ stent placement on 10/16/2017 for hydronephrosis , cirrhosis, pancreatic cyst (last MRI of abdomen on 05/29/2016 favors benign, repeat in 2 years), indeterminate liver lesion (MRI of abdomen on 05/29/2016 recommends repeat MRI in 3 months) who presents with profound weakness, sepsis, AKI found to have right-sided pyelonephritis with enterococcus bacteremia.   Assessment & Plan:   Principal Problem:   Severe sepsis with acute organ dysfunction (HCC) Active Problems:   HTN (hypertension)   DM II (diabetes mellitus, type II), controlled (HCC)   Closed compression fracture of body of lumbar vertebra (HCC)   AF (atrial fibrillation) (HCC)   Hyperlipidemia   History of stroke   Presence of inferior vena cava filter   Right ureteral stone   Sepsis (HCC)   ARF (acute renal failure) (HCC)   Metabolic acidosis   Acute pyelonephritis   Acute lower UTI   Status post placement of ureteral stent:R 10/15/2017   Dehydration   #) Enterococcus sepsis due to pyelonephritis: CT abdomen pelvis on admission showed evidence of pyelonephritis on the right. -Discontinue IV ampicillin started 10/27/2017 to 11/01/2017 -Started p.o. amoxicillin thousand milligrams twice daily on 11/01/2017 - blood cultures on 10/28/2017 NEG - Blood cultures from 10/26/2017 growing enterococcus -Echo on 10/27/2017 showed only grade 1 diastolic dysfunction -Infectious disease is recommending TEE, pending for 11/01/2017 -MRI abdomen with and without contrast ordered to follow-up on liver lesion  #) Nephrolithiasis status post right UPJ stent on 10/16/2017: Repeat CT  imaging on admission shows that renal pelvis is decompressed. -Discussed with urology, they recommend treating infection and then they will as an outpatient continue treating stones  #) AKI: Resolved  #) DVT/PE/atrial fibrillation: INR is improved with 2 units of FFP -Continue warfarin, pharmacy consult -DIC panel shows elevated fibrinogen but no evidence of DIC  #) Type 2 diabetes: Patient is only on home oral hypoglycemics however his blood sugars here been quite elevated so patient was started on insulin - Continue glargine to 15 units twice daily -Hold home glimepiride 4 mg every morning -Hold home glipizide 2.5 mg daily -Hold home metformin 500 mg twice daily  #) Cirrhosis/indeterminate liver lesion: Per MRI done on 05/29/2016 patient was noted to have cirrhosis and indeterminate liver lesion with recommendation for repeat MRI in 3 months.  This was not performed. -Repeat MRI of the abdomen pending  #) Hypertension/hyperlipidemia: -Hold home losartan 100 mg every morning -Continue simvastatin 40 mg nightly  #) Pancreatic lesion: Patient noted to have unusual necrotic lesion in the tail of the pancreas.  MRI on 05/29/2016.  Benign etiology with recommendation to repeat in 2 years. - Plan for repeat MRI in 2 years, approximately 05/2018  Fluids: Tolerating p.o. Electrolytes: Monitor and supplement Nutrition: Carb restricted diet  Prophylaxis: Warfarin  Disposition: Pending skilled nursing facility placement  Full code   Consultants:   Urology  Procedures:   None  Antimicrobials:  IV ampicillin started 10/27/2017   Subjective: Patient reports he is doing dramatically better.  He denies any nausea, vomiting, diarrhea, cough, congestion, rhinorrhea.  Objective: Vitals:   10/31/17 2031 11/01/17 0300 11/01/17 0338 11/01/17 0447  BP: (!) 146/80   (!) 143/78  Pulse: 93   86  Resp: 20   20  Temp: 99.5 F (37.5 C)   97.9 F (36.6 C)  TempSrc: Oral   Oral  SpO2:  94%   94%  Weight:  81.5 kg 81.5 kg   Height:        Intake/Output Summary (Last 24 hours) at 11/01/2017 1034 Last data filed at 10/31/2017 2210 Gross per 24 hour  Intake 240 ml  Output 1475 ml  Net -1235 ml   Filed Weights   10/31/17 0500 11/01/17 0300 11/01/17 0338  Weight: 79.9 kg 81.5 kg 81.5 kg    Examination:  General exam: Appears calm and comfortable  Respiratory system: Clear to auscultation. Respiratory effort normal. Cardiovascular system: Regular rate and rhythm, no murmurs. Gastrointestinal system: Soft, nondistended, nontender to palpation, no rebound or guarding, plus bowel sounds. Central nervous system: Alert and oriented. No focal neurological deficits. Extremities: Trace lower extremity edema Skin: No rashes over visible skin Psychiatry: Judgement and insight appear normal. Mood & affect appropriate.     Data Reviewed: I have personally reviewed following labs and imaging studies  CBC: Recent Labs  Lab 10/28/17 0531 10/29/17 0523 10/29/17 1833 10/30/17 0515 10/31/17 0538 11/01/17 0531  WBC 12.7* 12.7*  --  12.7* 11.1* 10.0  HGB 13.4 13.0  --  11.1* 11.9* 10.8*  HCT 40.5 39.7  --  33.7* 36.0* 33.0*  MCV 92.5 92.8  --  92.1 91.4 93.5  PLT 132* 123* 138* 141* 194 210   Basic Metabolic Panel: Recent Labs  Lab 10/26/17 1746 10/27/17 0554 10/28/17 0531 10/29/17 0523 10/31/17 0538 11/01/17 0531  NA 135 136 136 133* 139 140  K 4.1 4.7 3.9 3.7 3.1* 4.0  CL 105 104 105 103 104 109  CO2 20* 21* 22 20* 25 23  GLUCOSE 327* 317* 241* 188* 147* 128*  BUN 32* 34* 27* 26* 22 17  CREATININE 1.34* 1.27* 1.04 0.88 0.97 0.92  CALCIUM 8.3* 7.9* 7.5* 7.4* 7.5* 7.4*  MG 1.8  --  1.9  --   --   --    GFR: Estimated Creatinine Clearance: 65.8 mL/min (by C-G formula based on SCr of 0.92 mg/dL). Liver Function Tests: Recent Labs  Lab 10/26/17 1201 10/28/17 0531  AST 97* 62*  ALT 41 46*  ALKPHOS 60 44  BILITOT 1.2 1.0  PROT 7.2 5.6*  ALBUMIN 3.7  2.4*   Recent Labs  Lab 10/26/17 1201  LIPASE 21   No results for input(s): AMMONIA in the last 168 hours. Coagulation Profile: Recent Labs  Lab 10/29/17 0523 10/29/17 1833 10/30/17 0515 10/31/17 0538 11/01/17 0531  INR 5.29* 3.24 3.22 3.59 3.63   Cardiac Enzymes: No results for input(s): CKTOTAL, CKMB, CKMBINDEX, TROPONINI in the last 168 hours. BNP (last 3 results) No results for input(s): PROBNP in the last 8760 hours. HbA1C: No results for input(s): HGBA1C in the last 72 hours. CBG: Recent Labs  Lab 10/31/17 0741 10/31/17 1208 10/31/17 1625 10/31/17 2111 11/01/17 0753  GLUCAP 124* 163* 149* 121* 110*   Lipid Profile: No results for input(s): CHOL, HDL, LDLCALC, TRIG, CHOLHDL, LDLDIRECT in the last 72 hours. Thyroid Function Tests: No results for input(s): TSH, T4TOTAL, FREET4, T3FREE, THYROIDAB in the last 72 hours. Anemia Panel: No results for input(s): VITAMINB12, FOLATE, FERRITIN, TIBC, IRON, RETICCTPCT in the last 72 hours. Sepsis Labs: Recent Labs  Lab 10/26/17 1223 10/26/17 1420 10/26/17 1746 10/27/17 0554 10/28/17 0531  PROCALCITON  --   --  1.61 1.95 1.32  LATICACIDVEN 7.15* 6.73*  --  2.5*  --     Recent Results (from the past 240 hour(s))  Blood culture (routine x 2)     Status: Abnormal   Collection Time: 10/26/17 12:34 PM  Result Value Ref Range Status   Specimen Description   Final    BLOOD RIGHT ARM Performed at [facility], [address] [place], [place] [address]    Special Requests   Final    BOTTLES DRAWN AEROBIC AND ANAEROBIC Blood Culture adequate volume Performed at [facility], [address] [place], [place] [address]    Culture  Setup Time   Final    GRAM POSITIVE COCCI IN BOTH AEROBIC AND ANAEROBIC BOTTLES CRITICAL RESULT CALLED TO, READ BACK BY AND VERIFIED WITH: PHARMD J LEGGE 10/27/17 AT 908 AM BY CM Performed at [facility], [address], [place], [place]  [address]    Culture ENTEROCOCCUS FAECALIS (A)  Final   Report Status 10/29/2017 FINAL  Final   Organism ID, Bacteria ENTEROCOCCUS FAECALIS  Final      Susceptibility   Enterococcus faecalis - MIC*    AMPICILLIN <=2 SENSITIVE Sensitive     VANCOMYCIN 1 SENSITIVE Sensitive     GENTAMICIN SYNERGY SENSITIVE Sensitive     * ENTEROCOCCUS FAECALIS  Blood Culture ID Panel (Reflexed)     Status: Abnormal   Collection Time: 10/26/17 12:34 PM  Result Value Ref Range Status   Enterococcus species DETECTED (A) NOT DETECTED Final    Comment: CRITICAL RESULT CALLED TO, READ BACK BY AND VERIFIED WITH: PHARMD J LEGGE 10/27/17 AT 908 AM BY CM    Vancomycin resistance NOT DETECTED NOT DETECTED Final   Listeria monocytogenes NOT DETECTED NOT DETECTED Final   Staphylococcus species NOT DETECTED NOT DETECTED Final   Staphylococcus aureus (BCID) NOT DETECTED NOT DETECTED Final   Streptococcus species NOT DETECTED NOT DETECTED Final   Streptococcus agalactiae NOT DETECTED NOT DETECTED Final   Streptococcus pneumoniae NOT DETECTED NOT DETECTED Final   Streptococcus pyogenes NOT DETECTED NOT DETECTED Final   Acinetobacter baumannii NOT DETECTED NOT DETECTED Final   Enterobacteriaceae species NOT DETECTED NOT DETECTED Final   Enterobacter cloacae complex NOT DETECTED NOT DETECTED Final   Escherichia coli NOT DETECTED NOT DETECTED Final   Klebsiella oxytoca NOT DETECTED NOT DETECTED Final   Klebsiella pneumoniae NOT DETECTED NOT DETECTED Final   Proteus species NOT DETECTED NOT DETECTED Final   Serratia marcescens NOT DETECTED NOT DETECTED Final   Haemophilus influenzae NOT DETECTED NOT DETECTED Final   Neisseria meningitidis NOT DETECTED NOT DETECTED Final   Pseudomonas aeruginosa NOT DETECTED NOT DETECTED Final   Candida albicans NOT DETECTED NOT DETECTED Final   Candida glabrata NOT DETECTED NOT DETECTED Final   Candida krusei NOT DETECTED NOT DETECTED Final   Candida parapsilosis NOT DETECTED NOT  DETECTED Final   Candida tropicalis NOT DETECTED NOT DETECTED Final    Comment: Performed at [facility], [address] [place], [place] [address]  Blood culture (routine x 2)     Status: None   Collection Time: 10/26/17 12:45 PM  Result Value Ref Range Status   Specimen Description   Final    BLOOD LEFT ARM Performed at [facility], [address] [place], [place] [address]    Special Requests   Final    BOTTLES DRAWN AEROBIC AND ANAEROBIC Blood Culture results may not be optimal due to an excessive volume of blood received in culture bottles  Performed at [facility], [address] [place], [place] [address]    Culture   Final    NO GROWTH 5 DAYS Performed at [facility], [address], [place], [place] [address]    Report Status 10/31/2017 FINAL  Final  Urine culture     Status: Abnormal   Collection Time: 10/26/17 12:59 PM  Result Value Ref Range Status   Specimen Description   Final    URINE, RANDOM Performed at [facility], [address] [place], [place] [address]    Special Requests   Final    NONE Performed at [facility], [address], [place], [place] [address]    Culture MULTIPLE SPECIES PRESENT, SUGGEST RECOLLECTION (A)  Final   Report Status 10/27/2017 FINAL  Final  MRSA PCR Screening     Status: None   Collection Time: 10/26/17  5:44 PM  Result Value Ref Range Status   MRSA by PCR NEGATIVE NEGATIVE Final    Comment:        The GeneXpert MRSA Assay (FDA approved for NASAL specimens only), is one component of a comprehensive MRSA colonization surveillance program. It is not intended to diagnose MRSA infection nor to guide or monitor treatment for MRSA infections. Performed at [facility], [address] [place], [place] [address]   Culture, blood (Routine X 2) w Reflex to ID Panel     Status: None (Preliminary result)   Collection  Time: 10/28/17  5:33 AM  Result Value Ref Range Status   Specimen Description   Final    BLOOD LEFT ANTECUBITAL Performed at [facility], [address], [place], [place] [address]    Special Requests   Final    BOTTLES DRAWN AEROBIC AND ANAEROBIC Blood Culture adequate volume Performed at [facility], [address] [place], [place] [address]    Culture   Final    NO GROWTH 3 DAYS Performed at [facility], [address] [place], [place] [address]    Report Status PENDING  Incomplete         Radiology Studies: No results found.      Scheduled Meds: . sodium chloride   Intravenous Once  . amoxicillin  1,000 mg Oral Q12H  . insulin aspart  0-15 Units Subcutaneous TID WC  . insulin aspart  0-5 Units Subcutaneous QHS  . insulin glargine  15 Units Subcutaneous BID  . simvastatin  40 mg Oral QPM  . sodium chloride flush  3 mL Intravenous Q12H  . Warfarin - Pharmacist Dosing Inpatient   Does not apply q1800   Continuous Infusions:    LOS: 6 days    Time spent: 35    Delaine Lame, MD [facility]  If 7PM-7AM, please contact night-coverage [email or web address] Password [facility] 11/01/2017, 10:34 AM

## 2017-11-01 NOTE — Progress Notes (Signed)
PT Cancellation Note  Patient Details Name: [name] MRN: [id number] DOB: [birth year]   Cancelled Treatment:    Reason Eval/Treat Not Completed: Fatigue/lethargy limiting ability to participate, patient reports very tired. RN reports ambulation to BR several times. Going for MRI later. Will check back another time/   Rada Hay 11/01/2017, 3:28 PM  Blanchard Kelch PT Acute Rehabilitation Services Pager [phone] Office [phone]

## 2017-11-01 NOTE — Progress Notes (Signed)
INFECTIOUS DISEASE PROGRESS NOTE  ID: [name] is a 77 y.o. male with  Principal Problem:   Severe sepsis with acute organ dysfunction (HCC) Active Problems:   HTN (hypertension)   DM II (diabetes mellitus, type II), controlled (HCC)   Closed compression fracture of body of lumbar vertebra (HCC)   AF (atrial fibrillation) (HCC)   Hyperlipidemia   History of stroke   Presence of inferior vena cava filter   Right ureteral stone   Sepsis (HCC)   ARF (acute renal failure) (HCC)   Metabolic acidosis   Acute pyelonephritis   Acute lower UTI   Status post placement of ureteral stent:R 10/15/2017   Dehydration  Subjective: Resting quietly Getting mri abd this pm  Abtx:  Anti-infectives (From admission, onward)   Start     Dose/Rate Route Frequency Ordered Stop   10/28/17 1600  ampicillin (OMNIPEN) 2 g in sodium chloride 0.9 % 100 mL IVPB     2 g 300 mL/hr over 20 Minutes Intravenous Every 4 hours 10/28/17 1301     10/28/17 1200  vancomycin (VANCOCIN) 1,500 mg in sodium chloride 0.9 % 500 mL IVPB  Status:  Discontinued     1,500 mg 250 mL/hr over 120 Minutes Intravenous Every 48 hours 10/26/17 1705 10/27/17 0926   10/27/17 1200  ampicillin (OMNIPEN) 2 g in sodium chloride 0.9 % 100 mL IVPB  Status:  Discontinued     2 g 300 mL/hr over 20 Minutes Intravenous Every 6 hours 10/27/17 0928 10/28/17 1301   10/27/17 1000  ceFEPIme (MAXIPIME) 2 g in sodium chloride 0.9 % 100 mL IVPB  Status:  Discontinued     2 g 200 mL/hr over 30 Minutes Intravenous Every 24 hours 10/26/17 1705 10/27/17 0926   10/26/17 1645  ceFEPIme (MAXIPIME) 2 g in sodium chloride 0.9 % 100 mL IVPB  Status:  Discontinued     2 g 200 mL/hr over 30 Minutes Intravenous  Once 10/26/17 1637 10/26/17 1642   10/26/17 1645  vancomycin (VANCOCIN) IVPB 1000 mg/200 mL premix  Status:  Discontinued     1,000 mg 200 mL/hr over 60 Minutes Intravenous  Once 10/26/17 1637 10/26/17 1642   10/26/17 1230  ceFEPIme (MAXIPIME)  2 g in sodium chloride 0.9 % 100 mL IVPB     2 g 200 mL/hr over 30 Minutes Intravenous  Once 10/26/17 1216 10/26/17 1316   10/26/17 1230  vancomycin (VANCOCIN) 1,500 mg in sodium chloride 0.9 % 500 mL IVPB     1,500 mg 250 mL/hr over 120 Minutes Intravenous  Once 10/26/17 1216 10/26/17 1607      Medications:  Scheduled: . sodium chloride   Intravenous Once  . amoxicillin  1,000 mg Oral Q12H  . insulin aspart  0-15 Units Subcutaneous TID WC  . insulin aspart  0-5 Units Subcutaneous QHS  . insulin glargine  15 Units Subcutaneous BID  . simvastatin  40 mg Oral QPM  . sodium chloride flush  3 mL Intravenous Q12H  . Warfarin - Pharmacist Dosing Inpatient   Does not apply q1800    Objective: Vital signs in last 24 hours: Temp:  [97.9 F (36.6 C)-99.5 F (37.5 C)] 97.9 F (36.6 C) (10/28 0447) Pulse Rate:  [86-93] 86 (10/28 0447) Resp:  [20-22] 20 (10/28 0447) BP: (136-146)/(62-80) 143/78 (10/28 0447) SpO2:  [92 %-94 %] 94 % (10/28 0447) Weight:  [81.5 kg] 81.5 kg (10/28 0338)   General appearance: no distress Resp: clear to auscultation bilaterally Cardio:  regular rate and rhythm GI: normal findings: bowel sounds normal and soft, non-tender  Lab Results Recent Labs    10/31/17 0538 11/01/17 0531  WBC 11.1* 10.0  HGB 11.9* 10.8*  HCT 36.0* 33.0*  NA 139 140  K 3.1* 4.0  CL 104 109  CO2 25 23  BUN 22 17  CREATININE 0.97 0.92   Liver Panel No results for input(s): PROT, ALBUMIN, AST, ALT, ALKPHOS, BILITOT, BILIDIR, IBILI in the last 72 hours. Sedimentation Rate No results for input(s): ESRSEDRATE in the last 72 hours. C-Reactive Protein No results for input(s): CRP in the last 72 hours.  Microbiology: Recent Results (from the past 240 hour(s))  Blood culture (routine x 2)     Status: Abnormal   Collection Time: 10/26/17 12:34 PM  Result Value Ref Range Status   Specimen Description   Final    BLOOD RIGHT ARM Performed at [facility], [address], [place], [place] [address]    Special Requests   Final    BOTTLES DRAWN AEROBIC AND ANAEROBIC Blood Culture adequate volume Performed at [facility], [address], [place], [place] [address]    Culture  Setup Time   Final    GRAM POSITIVE COCCI IN BOTH AEROBIC AND ANAEROBIC BOTTLES CRITICAL RESULT CALLED TO, READ BACK BY AND VERIFIED WITH: PHARMD J LEGGE 10/27/17 AT 908 AM BY CM Performed at [facility], [address], [place], [place] [address]    Culture ENTEROCOCCUS FAECALIS (A)  Final   Report Status 10/29/2017 FINAL  Final   Organism ID, Bacteria ENTEROCOCCUS FAECALIS  Final      Susceptibility   Enterococcus faecalis - MIC*    AMPICILLIN <=2 SENSITIVE Sensitive     VANCOMYCIN 1 SENSITIVE Sensitive     GENTAMICIN SYNERGY SENSITIVE Sensitive     * ENTEROCOCCUS FAECALIS  Blood Culture ID Panel (Reflexed)     Status: Abnormal   Collection Time: 10/26/17 12:34 PM  Result Value Ref Range Status   Enterococcus species DETECTED (A) NOT DETECTED Final    Comment: CRITICAL RESULT CALLED TO, READ BACK BY AND VERIFIED WITH: PHARMD J LEGGE 10/27/17 AT 908 AM BY CM    Vancomycin resistance NOT DETECTED NOT DETECTED Final   Listeria monocytogenes NOT DETECTED NOT DETECTED Final   Staphylococcus species NOT DETECTED NOT DETECTED Final   Staphylococcus aureus (BCID) NOT DETECTED NOT DETECTED Final   Streptococcus species NOT DETECTED NOT DETECTED Final   Streptococcus agalactiae NOT DETECTED NOT DETECTED Final   Streptococcus pneumoniae NOT DETECTED NOT DETECTED Final   Streptococcus pyogenes NOT DETECTED NOT DETECTED Final   Acinetobacter baumannii NOT DETECTED NOT DETECTED Final   Enterobacteriaceae species NOT DETECTED NOT DETECTED Final   Enterobacter cloacae complex NOT DETECTED NOT DETECTED Final   Escherichia coli NOT DETECTED NOT DETECTED Final   Klebsiella oxytoca NOT DETECTED NOT DETECTED Final   Klebsiella pneumoniae NOT DETECTED NOT  DETECTED Final   Proteus species NOT DETECTED NOT DETECTED Final   Serratia marcescens NOT DETECTED NOT DETECTED Final   Haemophilus influenzae NOT DETECTED NOT DETECTED Final   Neisseria meningitidis NOT DETECTED NOT DETECTED Final   Pseudomonas aeruginosa NOT DETECTED NOT DETECTED Final   Candida albicans NOT DETECTED NOT DETECTED Final   Candida glabrata NOT DETECTED NOT DETECTED Final   Candida krusei NOT DETECTED NOT DETECTED Final   Candida parapsilosis NOT DETECTED NOT DETECTED Final   Candida tropicalis NOT DETECTED NOT DETECTED Final  Comment: Performed at [facility], [address] [place], [place] [address]  Blood culture (routine x 2)     Status: None   Collection Time: 10/26/17 12:45 PM  Result Value Ref Range Status   Specimen Description   Final    BLOOD LEFT ARM Performed at [facility], [address] [place], [place] [address]    Special Requests   Final    BOTTLES DRAWN AEROBIC AND ANAEROBIC Blood Culture results may not be optimal due to an excessive volume of blood received in culture bottles Performed at [facility], [address] [place], [place] [address]    Culture   Final    NO GROWTH 5 DAYS Performed at [facility], [address], [place], [place] [address]    Report Status 10/31/2017 FINAL  Final  Urine culture     Status: Abnormal   Collection Time: 10/26/17 12:59 PM  Result Value Ref Range Status   Specimen Description   Final    URINE, RANDOM Performed at [facility], [address] [place], [place] [address]    Special Requests   Final    NONE Performed at [facility], [address], [place], [place] [address]    Culture MULTIPLE SPECIES PRESENT, SUGGEST RECOLLECTION (A)  Final   Report Status 10/27/2017 FINAL  Final  MRSA PCR Screening     Status: None   Collection Time: 10/26/17  5:44 PM  Result Value Ref Range Status   MRSA by PCR  NEGATIVE NEGATIVE Final    Comment:        The GeneXpert MRSA Assay (FDA approved for NASAL specimens only), is one component of a comprehensive MRSA colonization surveillance program. It is not intended to diagnose MRSA infection nor to guide or monitor treatment for MRSA infections. Performed at [facility], [address] [place], [place] [address]   Culture, blood (Routine X 2) w Reflex to ID Panel     Status: None (Preliminary result)   Collection Time: 10/28/17  5:33 AM  Result Value Ref Range Status   Specimen Description   Final    BLOOD LEFT ANTECUBITAL Performed at [facility], [address] [place], [place] [address]    Special Requests   Final    BOTTLES DRAWN AEROBIC AND ANAEROBIC Blood Culture adequate volume Performed at [facility], [address], [place], [place] [address]    Culture   Final    NO GROWTH 3 DAYS Performed at [facility], [address] [place], [place] [address]    Report Status PENDING  Incomplete    Studies/Results: No results found.   Assessment/Plan: Enterococcal bacteremia DM2 with hyperglycemia, acidosis UPJ stent10-12-19 AKI (improving)  Total days of antibiotics:6 amp  Repeat BCx (10-24) ngtd Awaiting TEE to determine length of therapy, possible endocarditis. CV eval pending To have MRI this PM to eval liver lesion D/c planning, SNF?     Johny Sax MD, FACP Infectious Diseases (pager) [phone] [email or web address] 11/01/2017, 8:51 AM  LOS: 6 days

## 2017-11-01 NOTE — Progress Notes (Signed)
ANTICOAGULATION CONSULT NOTE   Pharmacy Consult for Warfarin Indication: atrial fibrillation, pulmonary embolus, DVT and stroke  Allergies  Allergen Reactions  . Prednisone     Other reaction(s): Other (See Comments) unknown  . Zithromax [Azithromycin] Other (See Comments)    Weak and flulike symptoms    Patient Measurements: Height: 5\' 4"  (162.6 cm) Weight: 179 lb 10.8 oz (81.5 kg) IBW/kg (Calculated) : 59.2  Vital Signs: Temp: 97.9 F (36.6 C) (10/28 0447) Temp Source: Oral (10/28 0447) BP: 143/78 (10/28 0447) Pulse Rate: 86 (10/28 0447)  Labs: Recent Labs    10/29/17 1833  10/30/17 0515 10/31/17 0538 11/01/17 0531  HGB  --    < > 11.1* 11.9* 10.8*  HCT  --   --  33.7* 36.0* 33.0*  PLT 138*  --  141* 194 210  APTT 44*  --   --   --   --   LABPROT 32.6*  --  32.4* 35.3* 35.6*  INR 3.24  --  3.22 3.59 3.63  CREATININE  --   --   --  0.97 0.92   < > = values in this interval not displayed.    Estimated Creatinine Clearance: 65.8 mL/min (by C-G formula based on SCr of 0.92 mg/dL).   Medications:  Scheduled:  . sodium chloride   Intravenous Once  . amoxicillin  1,000 mg Oral Q12H  . insulin aspart  0-15 Units Subcutaneous TID WC  . insulin aspart  0-5 Units Subcutaneous QHS  . insulin glargine  15 Units Subcutaneous BID  . simvastatin  40 mg Oral QPM  . sodium chloride flush  3 mL Intravenous Q12H  . Warfarin - Pharmacist Dosing Inpatient   Does not apply q1800   Infusions:    Assessment: [name] admitted 10/22 with urosepsis.  He is on warfarin PTA for PE/DVT 2002, Afib and stroke 2002.  Pharmacy consulted to dose warfarin.  PTA warfarin dose 3 mg daily except 2 mg on Mon/Fri.  Last dose 10/21. Admission INR 3.75  Today, 11/01/2017:  INR 3.63 is trending down but remains Supratherapeutic despite no doses of warfarin given since 10/22  Hgb 10.8 decreased slightly, Plt 210 WNL  No bleeding or complications noted. No signs of bleeding per RN.  Pt  received 2 units of FFP 10/25  Diet: carb modified  No major drug drug interactions. Antibiotics may prolong INR.  Goal of Therapy:  INR 2-3 Monitor platelets by anticoagulation protocol: Yes   Plan:   Continue to HOLD warfarin for elevated INR.  Daily PT/INR.  Monitor for signs and symptoms of bleeding.  Cindi Carbon, PharmD 11/01/17 12:43 PM

## 2017-11-02 ENCOUNTER — Encounter (HOSPITAL_COMMUNITY): Payer: Self-pay

## 2017-11-02 DIAGNOSIS — N12 Tubulo-interstitial nephritis, not specified as acute or chronic: Secondary | ICD-10-CM

## 2017-11-02 LAB — GLUCOSE, CAPILLARY
GLUCOSE-CAPILLARY: 149 mg/dL — AB (ref 70–99)
Glucose-Capillary: 114 mg/dL — ABNORMAL HIGH (ref 70–99)
Glucose-Capillary: 202 mg/dL — ABNORMAL HIGH (ref 70–99)
Glucose-Capillary: 169 mg/dL — ABNORMAL HIGH (ref 70–99)

## 2017-11-02 LAB — CULTURE, BLOOD (ROUTINE X 2)
Culture: NO GROWTH
SPECIAL REQUESTS: ADEQUATE

## 2017-11-02 LAB — CBC
HCT: 33.5 % — ABNORMAL LOW (ref 39.0–52.0)
Hemoglobin: 10.9 g/dL — ABNORMAL LOW (ref 13.0–17.0)
MCH: 30.2 pg (ref 26.0–34.0)
MCHC: 32.5 g/dL (ref 30.0–36.0)
MCV: 92.8 fL (ref 80.0–100.0)
Platelets: 257 10*3/uL (ref 150–400)
RBC: 3.61 MIL/uL — ABNORMAL LOW (ref 4.22–5.81)
RDW: 14.4 % (ref 11.5–15.5)
WBC: 11.8 10*3/uL — ABNORMAL HIGH (ref 4.0–10.5)
nRBC: 0 % (ref 0.0–0.2)

## 2017-11-02 LAB — PROTIME-INR
INR: 3.18
Prothrombin Time: 32.1 seconds — ABNORMAL HIGH (ref 11.4–15.2)

## 2017-11-02 MED ORDER — ZOLPIDEM TARTRATE 5 MG PO TABS
5.0000 mg | ORAL_TABLET | Freq: Once | ORAL | Status: AC
  Administered 2017-11-02: 5 mg via ORAL
  Filled 2017-11-02: qty 1

## 2017-11-02 NOTE — Progress Notes (Signed)
INFECTIOUS DISEASE PROGRESS NOTE  ID: [name] is a 77 y.o. male with  Principal Problem:   Severe sepsis with acute organ dysfunction (HCC) Active Problems:   HTN (hypertension)   DM II (diabetes mellitus, type II), controlled (HCC)   Closed compression fracture of body of lumbar vertebra (HCC)   AF (atrial fibrillation) (HCC)   Hyperlipidemia   History of stroke   Presence of inferior vena cava filter   Right ureteral stone   Sepsis (HCC)   ARF (acute renal failure) (HCC)   Metabolic acidosis   Acute pyelonephritis   Acute lower UTI   Status post placement of ureteral stent:R 10/15/2017   Dehydration  Subjective: No complaints  Abtx:  Anti-infectives (From admission, onward)   Start     Dose/Rate Route Frequency Ordered Stop   11/01/17 1530  ampicillin (OMNIPEN) 2 g in sodium chloride 0.9 % 100 mL IVPB     2 g 300 mL/hr over 20 Minutes Intravenous Every 4 hours 11/01/17 1434     11/01/17 1400  amoxicillin (AMOXIL) capsule 1,000 mg  Status:  Discontinued     1,000 mg Oral Every 12 hours 11/01/17 1033 11/01/17 1434   10/28/17 1600  ampicillin (OMNIPEN) 2 g in sodium chloride 0.9 % 100 mL IVPB  Status:  Discontinued     2 g 300 mL/hr over 20 Minutes Intravenous Every 4 hours 10/28/17 1301 11/01/17 1033   10/28/17 1200  vancomycin (VANCOCIN) 1,500 mg in sodium chloride 0.9 % 500 mL IVPB  Status:  Discontinued     1,500 mg 250 mL/hr over 120 Minutes Intravenous Every 48 hours 10/26/17 1705 10/27/17 0926   10/27/17 1200  ampicillin (OMNIPEN) 2 g in sodium chloride 0.9 % 100 mL IVPB  Status:  Discontinued     2 g 300 mL/hr over 20 Minutes Intravenous Every 6 hours 10/27/17 0928 10/28/17 1301   10/27/17 1000  ceFEPIme (MAXIPIME) 2 g in sodium chloride 0.9 % 100 mL IVPB  Status:  Discontinued     2 g 200 mL/hr over 30 Minutes Intravenous Every 24 hours 10/26/17 1705 10/27/17 0926   10/26/17 1645  ceFEPIme (MAXIPIME) 2 g in sodium chloride 0.9 % 100 mL IVPB  Status:   Discontinued     2 g 200 mL/hr over 30 Minutes Intravenous  Once 10/26/17 1637 10/26/17 1642   10/26/17 1645  vancomycin (VANCOCIN) IVPB 1000 mg/200 mL premix  Status:  Discontinued     1,000 mg 200 mL/hr over 60 Minutes Intravenous  Once 10/26/17 1637 10/26/17 1642   10/26/17 1230  ceFEPIme (MAXIPIME) 2 g in sodium chloride 0.9 % 100 mL IVPB     2 g 200 mL/hr over 30 Minutes Intravenous  Once 10/26/17 1216 10/26/17 1316   10/26/17 1230  vancomycin (VANCOCIN) 1,500 mg in sodium chloride 0.9 % 500 mL IVPB     1,500 mg 250 mL/hr over 120 Minutes Intravenous  Once 10/26/17 1216 10/26/17 1607      Medications:  Scheduled: . sodium chloride   Intravenous Once  . insulin aspart  0-15 Units Subcutaneous TID WC  . insulin aspart  0-5 Units Subcutaneous QHS  . insulin glargine  15 Units Subcutaneous BID  . simvastatin  40 mg Oral QPM  . sodium chloride flush  3 mL Intravenous Q12H  . Warfarin - Pharmacist Dosing Inpatient   Does not apply q1800    Objective: Vital signs in last 24 hours: Temp:  [98.7 F (37.1 C)-98.8 F (  37.1 C)] 98.7 F (37.1 C) (10/29 0541) Pulse Rate:  [81-100] 81 (10/29 0541) Resp:  [16-18] 18 (10/29 0541) BP: (123-132)/(67-91) 132/67 (10/29 0541) SpO2:  [94 %-97 %] 97 % (10/29 0541)   General appearance: alert, cooperative and no distress Resp: clear to auscultation bilaterally Cardio: regular rate and rhythm GI: normal findings: bowel sounds normal and soft, non-tender  Lab Results Recent Labs    10/31/17 0538 11/01/17 0531 11/02/17 0556  WBC 11.1* 10.0 11.8*  HGB 11.9* 10.8* 10.9*  HCT 36.0* 33.0* 33.5*  NA 139 140  --   K 3.1* 4.0  --   CL 104 109  --   CO2 25 23  --   BUN 22 17  --   CREATININE 0.97 0.92  --    Liver Panel No results for input(s): PROT, ALBUMIN, AST, ALT, ALKPHOS, BILITOT, BILIDIR, IBILI in the last 72 hours. Sedimentation Rate No results for input(s): ESRSEDRATE in the last 72 hours. C-Reactive Protein No results for  input(s): CRP in the last 72 hours.  Microbiology: Recent Results (from the past 240 hour(s))  Blood culture (routine x 2)     Status: Abnormal   Collection Time: 10/26/17 12:34 PM  Result Value Ref Range Status   Specimen Description   Final    BLOOD RIGHT ARM Performed at [facility], [address], [place], [place] [address]    Special Requests   Final    BOTTLES DRAWN AEROBIC AND ANAEROBIC Blood Culture adequate volume Performed at [facility], [address], [place], [place] [address]    Culture  Setup Time   Final    GRAM POSITIVE COCCI IN BOTH AEROBIC AND ANAEROBIC BOTTLES CRITICAL RESULT CALLED TO, READ BACK BY AND VERIFIED WITH: PHARMD J LEGGE 10/27/17 AT 908 AM BY CM Performed at [facility], [address] [place], [place] [address]    Culture ENTEROCOCCUS FAECALIS (A)  Final   Report Status 10/29/2017 FINAL  Final   Organism ID, Bacteria ENTEROCOCCUS FAECALIS  Final      Susceptibility   Enterococcus faecalis - MIC*    AMPICILLIN <=2 SENSITIVE Sensitive     VANCOMYCIN 1 SENSITIVE Sensitive     GENTAMICIN SYNERGY SENSITIVE Sensitive     * ENTEROCOCCUS FAECALIS  Blood Culture ID Panel (Reflexed)     Status: Abnormal   Collection Time: 10/26/17 12:34 PM  Result Value Ref Range Status   Enterococcus species DETECTED (A) NOT DETECTED Final    Comment: CRITICAL RESULT CALLED TO, READ BACK BY AND VERIFIED WITH: PHARMD J LEGGE 10/27/17 AT 908 AM BY CM    Vancomycin resistance NOT DETECTED NOT DETECTED Final   Listeria monocytogenes NOT DETECTED NOT DETECTED Final   Staphylococcus species NOT DETECTED NOT DETECTED Final   Staphylococcus aureus (BCID) NOT DETECTED NOT DETECTED Final   Streptococcus species NOT DETECTED NOT DETECTED Final   Streptococcus agalactiae NOT DETECTED NOT DETECTED Final   Streptococcus pneumoniae NOT DETECTED NOT DETECTED Final   Streptococcus pyogenes NOT DETECTED NOT DETECTED Final    Acinetobacter baumannii NOT DETECTED NOT DETECTED Final   Enterobacteriaceae species NOT DETECTED NOT DETECTED Final   Enterobacter cloacae complex NOT DETECTED NOT DETECTED Final   Escherichia coli NOT DETECTED NOT DETECTED Final   Klebsiella oxytoca NOT DETECTED NOT DETECTED Final   Klebsiella pneumoniae NOT DETECTED NOT DETECTED Final   Proteus species NOT DETECTED NOT DETECTED Final   Serratia marcescens NOT DETECTED NOT DETECTED Final  Haemophilus influenzae NOT DETECTED NOT DETECTED Final   Neisseria meningitidis NOT DETECTED NOT DETECTED Final   Pseudomonas aeruginosa NOT DETECTED NOT DETECTED Final   Candida albicans NOT DETECTED NOT DETECTED Final   Candida glabrata NOT DETECTED NOT DETECTED Final   Candida krusei NOT DETECTED NOT DETECTED Final   Candida parapsilosis NOT DETECTED NOT DETECTED Final   Candida tropicalis NOT DETECTED NOT DETECTED Final    Comment: Performed at [facility], [address], [place], [place] [address]  Blood culture (routine x 2)     Status: None   Collection Time: 10/26/17 12:45 PM  Result Value Ref Range Status   Specimen Description   Final    BLOOD LEFT ARM Performed at [facility], [address] [place], [place] [address]    Special Requests   Final    BOTTLES DRAWN AEROBIC AND ANAEROBIC Blood Culture results may not be optimal due to an excessive volume of blood received in culture bottles Performed at [facility], [address] [place], [place] [address]    Culture   Final    NO GROWTH 5 DAYS Performed at [facility], [address] [place], [place] [address]    Report Status 10/31/2017 FINAL  Final  Urine culture     Status: Abnormal   Collection Time: 10/26/17 12:59 PM  Result Value Ref Range Status   Specimen Description   Final    URINE, RANDOM Performed at [facility], [address], [place], [place] [address]    Special Requests   Final     NONE Performed at [facility], [address], [place], [place] [address]    Culture MULTIPLE SPECIES PRESENT, SUGGEST RECOLLECTION (A)  Final   Report Status 10/27/2017 FINAL  Final  MRSA PCR Screening     Status: None   Collection Time: 10/26/17  5:44 PM  Result Value Ref Range Status   MRSA by PCR NEGATIVE NEGATIVE Final    Comment:        The GeneXpert MRSA Assay (FDA approved for NASAL specimens only), is one component of a comprehensive MRSA colonization surveillance program. It is not intended to diagnose MRSA infection nor to guide or monitor treatment for MRSA infections. Performed at [facility], [address], [place], [place] [address]   Culture, blood (Routine X 2) w Reflex to ID Panel     Status: None (Preliminary result)   Collection Time: 10/28/17  5:33 AM  Result Value Ref Range Status   Specimen Description   Final    BLOOD LEFT ANTECUBITAL Performed at [facility], [address] [place], [place] [address]    Special Requests   Final    BOTTLES DRAWN AEROBIC AND ANAEROBIC Blood Culture adequate volume Performed at [facility], [address], [place], [place] [address]    Culture   Final    NO GROWTH 4 DAYS Performed at [facility], [address], [place], [place] [address]    Report Status PENDING  Incomplete    Studies/Results: Mr Abdomen W Wo Contrast  Result Date: 11/02/2017 CLINICAL DATA:  Abdominal infection. EXAM: MRI ABDOMEN WITHOUT AND WITH CONTRAST TECHNIQUE: Multiplanar multisequence MR imaging of the abdomen was performed both before and after the administration of intravenous contrast. CONTRAST:  10 cc Gadavist COMPARISON:  Multiple exams, including 05/29/2016 and 10/26/2017 CT scan FINDINGS: Despite efforts by the technologist  and patient, motion artifact is present on today's exam and could not be eliminated. This reduces exam sensitivity and  specificity. This is a common result when MRI abdomen is attempted in the inpatient setting where patients are less likely to be able to breath hold and cooperate in controlling motion. Lower chest: Trace right pleural effusion. Hepatobiliary: Nodular hepatic morphology compatible with cirrhosis. The peripheral triangular lesion in segment 7 of the liver posterolaterally measures 1.8 by 1.9 cm and has low T1 and equivocally increased T2 signal, and generally hypo enhances with respect to the rest of the liver although does have some internal enhancement which becomes isointense to the rest of the liver on delayed images. This lesion was documented is measuring 3.5 cm on the prior MRI abdomen from 05/17/2002, and measured 3.5 cm on the CT scan from 11/27/2006. Given the long-term lack of progression this lesion is felt to be benign. Tiny cyst in segment 3 of the liver, image 59/1003. Gallbladder unremarkable.  No appreciable biliary dilatation. Pancreas: 1.2 by 1.1 cm cystic lesion in the tail the pancreas, not changed from 05/29/2016. Otherwise unremarkable. Spleen:  Unremarkable Adrenals/Urinary Tract: Adrenal glands normal. Non rotated left kidney with a left kidney lower pole Bosniak category 1 cyst. There is abnormal right perirenal stranding along with accentuated enhancement along the right collecting system and right proximal ureter as well as mild right hydronephrosis. The patient has a ureteral stent in place and there is a filling defect posteriorly in the collecting system measuring 8 mm in diameter on image 50/6 compatible with a collecting system calculus. There is also a right kidney lower pole calculus as shown on recent CT. Low signal intensity along the posterior margin of the left kidney corresponds to the high density in this vicinity on recent CT. Stomach/Bowel: Mild wall thickening and potential fold effacement in the descending duodenum and proximal transverse duodenum, suspicious for  duodenitis. Vascular/Lymphatic: IVC filter noted. Aortoiliac atherosclerotic vascular disease. Uphill varices. Patent portal vein and splenic vein. Other: Perihepatic and perisplenic ascites along with ascites tracking in both paracolic gutters. Musculoskeletal: L2 compression fracture. Large Schmorl's node along the superior endplates of T12 and L1. IMPRESSION: 1. Cirrhosis and ascites. Uphill varices indicating portal venous hypertension. Patent portal vein and splenic vein. 2. Mild right hydronephrosis despite the right ureteral stent. Accentuated enhancement along the collecting system in ureteral wall is likely due to irritation. There a 8 mm calculus posteriorly in the right collecting system and a calculus in the right kidney lower pole. 3. 1.2 by 1.1 cm cystic lesion in the tail the pancreas, stable from 05/29/2016. This documents 17 months of stability. Follow up pancreatic protocol MRI with and without contrast is recommended in 2 years time. This recommendation follows ACR consensus guidelines: Management of Incidental Pancreatic Cysts: A White Paper of the ACR Incidental Findings Committee. J Am Coll Radiol 2017;14:911-923. 4. Trace right pleural effusion. 5. 1.9 cm peripheral triangular lesion in segment 7 of the liver has reduced in size back through 2004. This is felt to be a benign lesion such as atypical hemangioma. Given the long-term lack of progression this lesion is considered benign. 6.  Aortic Atherosclerosis (ICD10-I70.0). 7. Stable appearance of the L2 compression fracture. 8. Non rotated left kidney with low signal material along the posterior margin corresponding with the calcifications in this vicinity. Known left nephrolithiasis. Electronically Signed   By: Gaylyn Rong M.D.   On: 11/02/2017 10:40     Assessment/Plan: Enterococcal bacteremia DM2 with hyperglycemia,  acidosis UPJ stent10-12-69m, mild hydronephrosis, calculus AKI (resolved) Cirrhosis, varicies Pancreatic  mass (stable from 2018)  Total days of antibiotics:7amp  For TEE in AM.  Afebrile Cr normal Repeat BCx are negative He has questions about his residual stone, wants rx for his overactive bladder. I let him know that urology will f/u with him.          Johny Sax MD, FACP Infectious Diseases (pager) [phone] [email or web address] 11/02/2017, 11:06 AM  LOS: 7 days

## 2017-11-02 NOTE — Progress Notes (Signed)
Patient is scheduled for a TEE @ [facility] @ 0900 tomorrow. Spoke with Efraim Kaufmann, RN to set up carelink for arrival to [facility] around [phone]. Patient to be in NPO after MN.

## 2017-11-02 NOTE — Care Management Important Message (Signed)
Important Message  Patient Details  Name: DOMIQUE CLAPPER MRN: [id number] Date of Birth: Dec 20, 1940   Medicare Important Message Given:  Yes    Caren Macadam 11/02/2017, 11:43 AMImportant Message  Patient Details  Name: JAMARKIS BRANAM MRN: [id number] Date of Birth: [birth year]   Medicare Important Message Given:  Yes    Caren Macadam 11/02/2017, 11:43 AM

## 2017-11-02 NOTE — Progress Notes (Signed)
PROGRESS NOTE    [name]  ZOX:[id number] DOB: Oct 14, 1940 DOA: 10/26/2017 PCP: [name], MD   Brief Narrative:  77 year old with past medical history relevant for DVT/PE status post IVC filter placement, paroxysmal atrial fibrillation on warfarin, hypertension, type 2 diabetes not on insulin, hyperlipidemia, nephrolithiasis status post right UPJ stent placement on 10/16/2017 for hydronephrosis , cirrhosis, pancreatic cyst (last MRI of abdomen on 05/29/2016 favors benign, repeat in 2 years), indeterminate liver lesion (MRI of abdomen on 05/29/2016 recommends repeat MRI in 3 months) who presents with profound weakness, sepsis, AKI found to have right-sided pyelonephritis with enterococcus bacteremia.   Assessment & Plan:   Principal Problem:   Severe sepsis with acute organ dysfunction (HCC) Active Problems:   HTN (hypertension)   DM II (diabetes mellitus, type II), controlled (HCC)   Closed compression fracture of body of lumbar vertebra (HCC)   AF (atrial fibrillation) (HCC)   Hyperlipidemia   History of stroke   Presence of inferior vena cava filter   Right ureteral stone   Sepsis (HCC)   ARF (acute renal failure) (HCC)   Metabolic acidosis   Acute pyelonephritis   Acute lower UTI   Status post placement of ureteral stent:R 10/15/2017   Dehydration   #) Enterococcus sepsis due to pyelonephritis: CT abdomen pelvis on admission showed evidence of pyelonephritis on the right. -Continue IV ampicillin started 10/27/2017 - blood cultures on 10/28/2017 NEG - Blood cultures from 10/26/2017 growing enterococcus -Echo on 10/27/2017 showed only grade 1 diastolic dysfunction -Infectious disease is recommending TEE, pending for 11/03/2017 -MRI abdomen with and without contrast ordered to follow-up on liver lesion  #) Nephrolithiasis status post right UPJ stent on 10/16/2017: Repeat CT imaging on admission shows that renal pelvis is decompressed. -Discussed with urology,  they recommend treating infection and then they will as an outpatient continue treating stones  #) AKI: Resolved  #) DVT/PE/atrial fibrillation: INR is improved with 2 units of FFP -Continue warfarin, pharmacy consult -DIC panel shows elevated fibrinogen but no evidence of DIC  #) Type 2 diabetes: Patient is only on home oral hypoglycemics however his blood sugars here been quite elevated so patient was started on insulin - Continue glargine to 15 units twice daily -Hold home glimepiride 4 mg every morning -Hold home glipizide 2.5 mg daily -Hold home metformin 500 mg twice daily  #) Cirrhosis/indeterminate liver lesion: Per MRI done on 05/29/2016 patient was noted to have cirrhosis and indeterminate liver lesion with recommendation for repeat MRI in 3 months.  This was not performed. -Repeat MRI of the abdomen pending  #) Hypertension/hyperlipidemia: -Hold home losartan 100 mg every morning -Continue simvastatin 40 mg nightly  #) Pancreatic lesion: Patient noted to have unusual necrotic lesion in the tail of the pancreas.  MRI on 05/29/2016.  Benign etiology with recommendation to repeat in 2 years. - Plan for repeat MRI in 2 years, approximately 05/2018  Fluids: Tolerating p.o. Electrolytes: Monitor and supplement Nutrition: Carb restricted diet  Prophylaxis: Warfarin  Disposition: Pending skilled nursing facility placement and TEE  Full code   Consultants:   Urology  Procedures:   None  Antimicrobials:  IV ampicillin started 10/27/2017   Subjective: Patient sleeping in bed.  He reportedly was quite tired this morning and could not work with PT.  He denies any nausea, vomiting, diarrhea, cough, congestion, rhinorrhea.  Objective: Vitals:   11/01/17 0447 11/01/17 1403 11/01/17 2109 11/02/17 0541  BP: (!) 143/78 131/69 (!) 123/91 132/67  Pulse: 86 91  100 81  Resp: 20 16 18 18   Temp: 97.9 F (36.6 C) 98.8 F (37.1 C) 98.7 F (37.1 C) 98.7 F (37.1 C)  TempSrc:  Oral Oral Oral Oral  SpO2: 94% 96% 94% 97%  Weight:      Height:        Intake/Output Summary (Last 24 hours) at 11/02/2017 1032 Last data filed at 11/02/2017 0900 Gross per 24 hour  Intake 680 ml  Output 2500 ml  Net -1820 ml   Filed Weights   10/31/17 0500 11/01/17 0300 11/01/17 0338  Weight: 79.9 kg 81.5 kg 81.5 kg    Examination:  General exam: Appears calm and comfortable  Respiratory system: Clear to auscultation. Respiratory effort normal. Cardiovascular system: Regular rate and rhythm, no murmurs. Gastrointestinal system: Soft, nondistended, nontender to palpation, no rebound or guarding, plus bowel sounds. Central nervous system: Alert and oriented. No focal neurological deficits. Extremities: Trace lower extremity edema Skin: No rashes over visible skin Psychiatry: Judgement and insight appear normal. Mood & affect appropriate.     Data Reviewed: I have personally reviewed following labs and imaging studies  CBC: Recent Labs  Lab 10/29/17 0523 10/29/17 1833 10/30/17 0515 10/31/17 0538 11/01/17 0531 11/02/17 0556  WBC 12.7*  --  12.7* 11.1* 10.0 11.8*  HGB 13.0  --  11.1* 11.9* 10.8* 10.9*  HCT 39.7  --  33.7* 36.0* 33.0* 33.5*  MCV 92.8  --  92.1 91.4 93.5 92.8  PLT 123* 138* 141* 194 210 257   Basic Metabolic Panel: Recent Labs  Lab 10/26/17 1746 10/27/17 0554 10/28/17 0531 10/29/17 0523 10/31/17 0538 11/01/17 0531  NA 135 136 136 133* 139 140  K 4.1 4.7 3.9 3.7 3.1* 4.0  CL 105 104 105 103 104 109  CO2 20* 21* 22 20* 25 23  GLUCOSE 327* 317* 241* 188* 147* 128*  BUN 32* 34* 27* 26* 22 17  CREATININE 1.34* 1.27* 1.04 0.88 0.97 0.92  CALCIUM 8.3* 7.9* 7.5* 7.4* 7.5* 7.4*  MG 1.8  --  1.9  --   --   --    GFR: Estimated Creatinine Clearance: 65.8 mL/min (by C-G formula based on SCr of 0.92 mg/dL). Liver Function Tests: Recent Labs  Lab 10/26/17 1201 10/28/17 0531  AST 97* 62*  ALT 41 46*  ALKPHOS 60 44  BILITOT 1.2 1.0  PROT 7.2  5.6*  ALBUMIN 3.7 2.4*   Recent Labs  Lab 10/26/17 1201  LIPASE 21   No results for input(s): AMMONIA in the last 168 hours. Coagulation Profile: Recent Labs  Lab 10/29/17 1833 10/30/17 0515 10/31/17 0538 11/01/17 0531 11/02/17 0556  INR 3.24 3.22 3.59 3.63 3.18   Cardiac Enzymes: No results for input(s): CKTOTAL, CKMB, CKMBINDEX, TROPONINI in the last 168 hours. BNP (last 3 results) No results for input(s): PROBNP in the last 8760 hours. HbA1C: No results for input(s): HGBA1C in the last 72 hours. CBG: Recent Labs  Lab 11/01/17 0753 11/01/17 1157 11/01/17 1701 11/01/17 2107 11/02/17 0733  GLUCAP 110* 111* 157* 173* 114*   Lipid Profile: No results for input(s): CHOL, HDL, LDLCALC, TRIG, CHOLHDL, LDLDIRECT in the last 72 hours. Thyroid Function Tests: No results for input(s): TSH, T4TOTAL, FREET4, T3FREE, THYROIDAB in the last 72 hours. Anemia Panel: No results for input(s): VITAMINB12, FOLATE, FERRITIN, TIBC, IRON, RETICCTPCT in the last 72 hours. Sepsis Labs: Recent Labs  Lab 10/26/17 1223 10/26/17 1420 10/26/17 1746 10/27/17 0554 10/28/17 0531  PROCALCITON  --   --  1.61  1.95 1.32  LATICACIDVEN 7.15* 6.73*  --  2.5*  --     Recent Results (from the past 240 hour(s))  Blood culture (routine x 2)     Status: Abnormal   Collection Time: 10/26/17 12:34 PM  Result Value Ref Range Status   Specimen Description   Final    BLOOD RIGHT ARM Performed at [facility], [address], [place], [place] [address]    Special Requests   Final    BOTTLES DRAWN AEROBIC AND ANAEROBIC Blood Culture adequate volume Performed at [facility], [address] [place], [place] [address]    Culture  Setup Time   Final    GRAM POSITIVE COCCI IN BOTH AEROBIC AND ANAEROBIC BOTTLES CRITICAL RESULT CALLED TO, READ BACK BY AND VERIFIED WITH: PHARMD J LEGGE 10/27/17 AT 908 AM BY CM Performed at [facility], [address]  [place], [place] [address]    Culture ENTEROCOCCUS FAECALIS (A)  Final   Report Status 10/29/2017 FINAL  Final   Organism ID, Bacteria ENTEROCOCCUS FAECALIS  Final      Susceptibility   Enterococcus faecalis - MIC*    AMPICILLIN <=2 SENSITIVE Sensitive     VANCOMYCIN 1 SENSITIVE Sensitive     GENTAMICIN SYNERGY SENSITIVE Sensitive     * ENTEROCOCCUS FAECALIS  Blood Culture ID Panel (Reflexed)     Status: Abnormal   Collection Time: 10/26/17 12:34 PM  Result Value Ref Range Status   Enterococcus species DETECTED (A) NOT DETECTED Final    Comment: CRITICAL RESULT CALLED TO, READ BACK BY AND VERIFIED WITH: PHARMD J LEGGE 10/27/17 AT 908 AM BY CM    Vancomycin resistance NOT DETECTED NOT DETECTED Final   Listeria monocytogenes NOT DETECTED NOT DETECTED Final   Staphylococcus species NOT DETECTED NOT DETECTED Final   Staphylococcus aureus (BCID) NOT DETECTED NOT DETECTED Final   Streptococcus species NOT DETECTED NOT DETECTED Final   Streptococcus agalactiae NOT DETECTED NOT DETECTED Final   Streptococcus pneumoniae NOT DETECTED NOT DETECTED Final   Streptococcus pyogenes NOT DETECTED NOT DETECTED Final   Acinetobacter baumannii NOT DETECTED NOT DETECTED Final   Enterobacteriaceae species NOT DETECTED NOT DETECTED Final   Enterobacter cloacae complex NOT DETECTED NOT DETECTED Final   Escherichia coli NOT DETECTED NOT DETECTED Final   Klebsiella oxytoca NOT DETECTED NOT DETECTED Final   Klebsiella pneumoniae NOT DETECTED NOT DETECTED Final   Proteus species NOT DETECTED NOT DETECTED Final   Serratia marcescens NOT DETECTED NOT DETECTED Final   Haemophilus influenzae NOT DETECTED NOT DETECTED Final   Neisseria meningitidis NOT DETECTED NOT DETECTED Final   Pseudomonas aeruginosa NOT DETECTED NOT DETECTED Final   Candida albicans NOT DETECTED NOT DETECTED Final   Candida glabrata NOT DETECTED NOT DETECTED Final   Candida krusei NOT DETECTED NOT DETECTED Final   Candida parapsilosis NOT  DETECTED NOT DETECTED Final   Candida tropicalis NOT DETECTED NOT DETECTED Final    Comment: Performed at [facility], [address] [place], [place] [address]  Blood culture (routine x 2)     Status: None   Collection Time: 10/26/17 12:45 PM  Result Value Ref Range Status   Specimen Description   Final    BLOOD LEFT ARM Performed at [facility], [address] [place], [place] [address]    Special Requests   Final    BOTTLES DRAWN AEROBIC AND ANAEROBIC Blood Culture results may not be optimal due to  an excessive volume of blood received in culture bottles Performed at [facility], [address], [place], [place] [address]    Culture   Final    NO GROWTH 5 DAYS Performed at [facility], [address], [place], [place] [address]    Report Status 10/31/2017 FINAL  Final  Urine culture     Status: Abnormal   Collection Time: 10/26/17 12:59 PM  Result Value Ref Range Status   Specimen Description   Final    URINE, RANDOM Performed at [facility], [address] [place], [place] [address]    Special Requests   Final    NONE Performed at [facility], [address], [place], [place] [address]    Culture MULTIPLE SPECIES PRESENT, SUGGEST RECOLLECTION (A)  Final   Report Status 10/27/2017 FINAL  Final  MRSA PCR Screening     Status: None   Collection Time: 10/26/17  5:44 PM  Result Value Ref Range Status   MRSA by PCR NEGATIVE NEGATIVE Final    Comment:        The GeneXpert MRSA Assay (FDA approved for NASAL specimens only), is one component of a comprehensive MRSA colonization surveillance program. It is not intended to diagnose MRSA infection nor to guide or monitor treatment for MRSA infections. Performed at [facility], [address] [place], [place] [address]   Culture, blood (Routine X 2) w Reflex to ID Panel     Status: None (Preliminary result)    Collection Time: 10/28/17  5:33 AM  Result Value Ref Range Status   Specimen Description   Final    BLOOD LEFT ANTECUBITAL Performed at [facility], [address], [place], [place] [address]    Special Requests   Final    BOTTLES DRAWN AEROBIC AND ANAEROBIC Blood Culture adequate volume Performed at [facility], [address] [place], [place] [address]    Culture   Final    NO GROWTH 4 DAYS Performed at [facility], [address] [place], [place] [address]    Report Status PENDING  Incomplete         Radiology Studies: No results found.      Scheduled Meds: . sodium chloride   Intravenous Once  . insulin aspart  0-15 Units Subcutaneous TID WC  . insulin aspart  0-5 Units Subcutaneous QHS  . insulin glargine  15 Units Subcutaneous BID  . simvastatin  40 mg Oral QPM  . sodium chloride flush  3 mL Intravenous Q12H  . Warfarin - Pharmacist Dosing Inpatient   Does not apply q1800   Continuous Infusions: . ampicillin (OMNIPEN) IV 2 g (11/02/17 0754)     LOS: 7 days    Time spent: 35    Delaine Lame, MD [facility]  If 7PM-7AM, please contact night-coverage [email or web address] Password [email or web address] 11/02/2017, 10:32 AM

## 2017-11-02 NOTE — Progress Notes (Signed)
Occupational Therapy Treatment Patient Details Name: [name] MRN: [id number] DOB: [birth year] Today's Date: 11/02/2017    History of present illness [name] is a 77 y.o. male with medical history significant of nephrolithiasis with recent right UPJ calculus status post cystoscopy and right urethral stent placement 10/15/2017, diabetes mellitus, hypertension, history of DVT/PE status post IVC filter, history of atrial fibrillation, history of cirrhosis, pancreatic cyst noted on CT 05/28/2016, right liver mass noted on CT 05/28/2016, history of stroke presenting to the ED 10/22 with history of fall 2 days PTA, admitted  with generalized weakness, fever, d worsening R flank pain , UA was notable for pyuria, was tachycardic, tachypneic and hypertensive   OT comments  Pt making good progress with basic adls completing toileting with min assist to min guard and dressing with min guard. Pt with STM deficits and requires cues for hand placement during all transfers.  Making progress toward all goals.   Follow Up Recommendations  SNF;Supervision/Assistance - 24 hour    Equipment Recommendations  Other (comment)(tbd)    Recommendations for Other Services      Precautions / Restrictions Precautions Precautions: Fall Restrictions Weight Bearing Restrictions: No       Mobility Bed Mobility Overal bed mobility: Needs Assistance Bed Mobility: Supine to Sit     Supine to sit: Min guard     General bed mobility comments: Pt goes 1/2 way up to elbows and needs encouagement to come to a full sitting position.  Transfers Overall transfer level: Needs assistance Equipment used: Rolling walker (2 wheeled) Transfers: Sit to/from [employer] Sit to Stand: Min guard Stand pivot transfers: Min guard       General transfer comment: repeated sit<> stand trials for activity tolerance; min to min guard to rise and steady, incr time needed    Balance Overall balance  assessment: History of Falls;Needs assistance Sitting-balance support: Feet supported;No upper extremity supported Sitting balance-Leahy Scale: Good     Standing balance support: During functional activity;Bilateral upper extremity supported Standing balance-Leahy Scale: Fair Standing balance comment: Walked required for safety                           ADL either performed or assessed with clinical judgement   ADL Overall ADL's : Needs assistance/impaired Eating/Feeding: Independent;Sitting Eating/Feeding Details (indicate cue type and reason): Pt sat on EOB to eat and required no assist. Grooming: Wash/dry hands;Wash/dry face;Supervision/safety;Sitting Grooming Details (indicate cue type and reason): Pt stood at sink to groom with supervision.             Lower Body Dressing: Minimal assistance;Sit to/from stand;Cueing for compensatory techniques Lower Body Dressing Details (indicate cue type and reason): Pt donned and doffed socks without assist. Pt donned pants and required steading assist when standing to pull pants up. Toilet Transfer: Min guard;Ambulation;RW;Regular Teacher, adult education Details (indicate cue type and reason): Pt walked to bathroom with walker and toileted. Cues given for hand placement. Toileting- Architect and Hygiene: Min guard;Sit to/from stand       Functional mobility during ADLs: Min guard;Rolling walker General ADL Comments: Pt overall doing better with adls at min guard level.  Feel like he still lacks a slight bit of insight to his deficits.      Vision   Vision Assessment?: No apparent visual deficits Additional Comments: wears glasses   Perception     Praxis      Cognition Arousal/Alertness: Awake/alert Behavior  During Therapy: WFL for tasks assessed/performed Overall Cognitive Status: Within Functional Limits for tasks assessed                                 General Comments: states he has  STM deficits and has since CVA in 2002        Exercises     Shoulder Instructions       General Comments Pt slowly improving.    Pertinent Vitals/ Pain       Pain Assessment: No/denies pain  Home Living                                          Prior Functioning/Environment              Frequency  Min 2X/week        Progress Toward Goals  OT Goals(current goals can now be found in the care plan section)  Progress towards OT goals: Progressing toward goals  Acute Rehab OT Goals Patient Stated Goal: Feel better OT Goal Formulation: With patient Time For Goal Achievement: 11/11/17 Potential to Achieve Goals: Good ADL Goals Pt Will Perform Grooming: with modified independence;standing Pt Will Perform Lower Body Dressing: with modified independence;sitting/lateral leans;sit to/from stand Pt Will Transfer to Toilet: with modified independence;ambulating;regular height toilet Pt Will Perform Toileting - Clothing Manipulation and hygiene: with modified independence;sitting/lateral leans;sit to/from stand Additional ADL Goal #1: Pt will be Mod I stating 1-2 energy conservation techniques and implement during ADL session w/ no more than 1 vc.  Plan Discharge plan remains appropriate    Co-evaluation                 AM-PAC PT "6 Clicks" Daily Activity     Outcome Measure   Help from another person eating meals?: None Help from another person taking care of personal grooming?: None Help from another person toileting, which includes using toliet, bedpan, or urinal?: A Little Help from another person bathing (including washing, rinsing, drying)?: A Little Help from another person to put on and taking off regular upper body clothing?: A Little Help from another person to put on and taking off regular lower body clothing?: A Little 6 Click Score: 20    End of Session Equipment Utilized During Treatment: Rolling walker  OT Visit Diagnosis:  Muscle weakness (generalized) (M62.81);Unsteadiness on feet (R26.81)   Activity Tolerance Patient tolerated treatment well   Patient Left in bed;with bed alarm set;with call bell/phone within reach   Nurse Communication Mobility status        Time: 2956-2130 OT Time Calculation (min): 22 min  Charges: OT General Charges $OT Visit: 1 Visit OT Treatments $Self Care/Home Management : 8-22 mins  [facility] Valda Christenson,OTR/L [phone]   Hope Budds 11/02/2017, 12:25 PM

## 2017-11-02 NOTE — Progress Notes (Signed)
ANTICOAGULATION CONSULT NOTE   Pharmacy Consult for Warfarin Indication: atrial fibrillation, pulmonary embolus, DVT and stroke  Allergies  Allergen Reactions  . Prednisone     Other reaction(s): Other (See Comments) unknown  . Zithromax [Azithromycin] Other (See Comments)    Weak and flulike symptoms    Patient Measurements: Height: 5\' 4"  (162.6 cm) Weight: 179 lb 10.8 oz (81.5 kg) IBW/kg (Calculated) : 59.2  Vital Signs: Temp: 98.7 F (37.1 C) (10/29 0541) Temp Source: Oral (10/29 0541) BP: 132/67 (10/29 0541) Pulse Rate: 81 (10/29 0541)  Labs: Recent Labs    10/31/17 0538 11/01/17 0531 11/02/17 0556  HGB 11.9* 10.8* 10.9*  HCT 36.0* 33.0* 33.5*  PLT 194 210 257  LABPROT 35.3* 35.6* 32.1*  INR 3.59 3.63 3.18  CREATININE 0.97 0.92  --     Estimated Creatinine Clearance: 65.8 mL/min (by C-G formula based on SCr of 0.92 mg/dL).   Medications:  Scheduled:  . sodium chloride   Intravenous Once  . insulin aspart  0-15 Units Subcutaneous TID WC  . insulin aspart  0-5 Units Subcutaneous QHS  . insulin glargine  15 Units Subcutaneous BID  . simvastatin  40 mg Oral QPM  . sodium chloride flush  3 mL Intravenous Q12H  . Warfarin - Pharmacist Dosing Inpatient   Does not apply q1800   Infusions:  . ampicillin (OMNIPEN) IV 2 g (11/02/17 1225)    Assessment: [name] admitted 10/22 with urosepsis.  He is on warfarin PTA for PE/DVT 2002, Afib and stroke 2002. Pt has IVC filter.  Pharmacy consulted to dose warfarin.  PTA warfarin dose 3 mg daily except 2 mg on Mon/Fri.  Last dose PTA was 10/21. Admission INR 3.75  Today, 11/02/2017:  INR 3.18 is trending down but remains supratherapeutic. Last dose of warfarin was on 10/22.  Hgb 10.9 is low but stable  Plt 257 stable, WNL  No bleeding or complications noted.  Pt received 2 units of FFP 10/25  Diet: carb modified  No major drug drug interactions. Antibiotics may prolong INR.  Goal of Therapy:  INR  2-3 Monitor platelets by anticoagulation protocol: Yes   Plan:   Continue to hold warfarin for elevated INR.  Daily PT/INR.  Monitor for signs and symptoms of bleeding.  Cindi Carbon, PharmD Clinical Pharmacist 11/02/17 12:34 PM

## 2017-11-02 NOTE — Progress Notes (Signed)
Left message with pam gibson patient is in [facility] room 1419 and awaiting skilled nursing home placement

## 2017-11-03 ENCOUNTER — Encounter (HOSPITAL_COMMUNITY)
Admission: EM | Disposition: A | Discharge status: Skilled Nursing Facility | Payer: Self-pay | Source: Home / Self Care | Attending: Internal Medicine

## 2017-11-03 ENCOUNTER — Inpatient Hospital Stay (HOSPITAL_COMMUNITY): Payer: Medicare Other

## 2017-11-03 ENCOUNTER — Encounter (HOSPITAL_COMMUNITY): Payer: Self-pay | Admitting: Cardiology

## 2017-11-03 DIAGNOSIS — A419 Sepsis, unspecified organism: Secondary | ICD-10-CM

## 2017-11-03 DIAGNOSIS — R6520 Severe sepsis without septic shock: Secondary | ICD-10-CM

## 2017-11-03 DIAGNOSIS — R7881 Bacteremia: Secondary | ICD-10-CM

## 2017-11-03 HISTORY — PX: TEE WITHOUT CARDIOVERSION: SHX5443

## 2017-11-03 LAB — GLUCOSE, CAPILLARY
GLUCOSE-CAPILLARY: 166 mg/dL — AB (ref 70–99)
GLUCOSE-CAPILLARY: 147 mg/dL — AB (ref 70–99)
Glucose-Capillary: 120 mg/dL — ABNORMAL HIGH (ref 70–99)
Glucose-Capillary: 133 mg/dL — ABNORMAL HIGH (ref 70–99)

## 2017-11-03 LAB — PROTIME-INR
INR: 1.9
PROTHROMBIN TIME: 21.5 s — AB (ref 11.4–15.2)

## 2017-11-03 SURGERY — ECHOCARDIOGRAM, TRANSESOPHAGEAL
Anesthesia: Moderate Sedation

## 2017-11-03 MED ORDER — MIDAZOLAM HCL 5 MG/ML IJ SOLN
INTRAMUSCULAR | Status: AC
  Filled 2017-11-03: qty 2

## 2017-11-03 MED ORDER — FENTANYL CITRATE (PF) 100 MCG/2ML IJ SOLN
INTRAMUSCULAR | Status: DC | PRN
  Administered 2017-11-03 (×2): 25 ug via INTRAVENOUS

## 2017-11-03 MED ORDER — BUTAMBEN-TETRACAINE-BENZOCAINE 2-2-14 % EX AERO
INHALATION_SPRAY | CUTANEOUS | Status: DC | PRN
  Administered 2017-11-03: 2 via TOPICAL

## 2017-11-03 MED ORDER — DIPHENHYDRAMINE HCL 50 MG/ML IJ SOLN
INTRAMUSCULAR | Status: AC
  Filled 2017-11-03: qty 1

## 2017-11-03 MED ORDER — WARFARIN 0.5 MG HALF TABLET
0.5000 mg | ORAL_TABLET | Freq: Once | ORAL | Status: DC
  Filled 2017-11-03: qty 1

## 2017-11-03 MED ORDER — FENTANYL CITRATE (PF) 100 MCG/2ML IJ SOLN
INTRAMUSCULAR | Status: AC
  Filled 2017-11-03: qty 2

## 2017-11-03 MED ORDER — ZOLPIDEM TARTRATE 5 MG PO TABS
5.0000 mg | ORAL_TABLET | Freq: Every evening | ORAL | Status: DC | PRN
  Administered 2017-11-03: 5 mg via ORAL
  Filled 2017-11-03: qty 1

## 2017-11-03 MED ORDER — MIDAZOLAM HCL 10 MG/2ML IJ SOLN
INTRAMUSCULAR | Status: DC | PRN
  Administered 2017-11-03 (×2): 1 mg via INTRAVENOUS
  Administered 2017-11-03: 2 mg via INTRAVENOUS

## 2017-11-03 NOTE — Progress Notes (Signed)
  Echocardiogram Echocardiogram Transesophageal has been performed.  Delcie Roch 11/03/2017, 9:59 AM

## 2017-11-03 NOTE — CV Procedure (Signed)
Brief TEE note  No evidence of vegetations seen on any of the valves. Only trivial valvular regurgitation.   During this procedure the patient is administered a total of Versed 4 mg and Fentanyl 50 mcg to achieve and maintain moderate conscious sedation.  The patient's heart rate, blood pressure, and oxygen saturation are monitored continuously during the procedure. The period of conscious sedation is 23 minutes, of which I was present face-to-face 100% of this time.  Jodelle Red, MD, PhD [facility]  [address] [place], [place] [address] [phone]

## 2017-11-03 NOTE — Progress Notes (Signed)
INFECTIOUS DISEASE PROGRESS NOTE  ID: [name] is a 77 y.o. male with  Principal Problem:   Severe sepsis with acute organ dysfunction (HCC) Active Problems:   HTN (hypertension)   DM II (diabetes mellitus, type II), controlled (HCC)   Closed compression fracture of body of lumbar vertebra (HCC)   AF (atrial fibrillation) (HCC)   Hyperlipidemia   History of stroke   Presence of inferior vena cava filter   Right ureteral stone   Sepsis (HCC)   ARF (acute renal failure) (HCC)   Metabolic acidosis   Pyelonephritis   Acute lower UTI   Status post placement of ureteral stent:R 10/15/2017   Dehydration  Subjective: GI upset earlier in day, resolved.   Abtx:  Anti-infectives (From admission, onward)   Start     Dose/Rate Route Frequency Ordered Stop   11/01/17 1530  ampicillin (OMNIPEN) 2 g in sodium chloride 0.9 % 100 mL IVPB     2 g 300 mL/hr over 20 Minutes Intravenous Every 4 hours 11/01/17 1434     11/01/17 1400  amoxicillin (AMOXIL) capsule 1,000 mg  Status:  Discontinued     1,000 mg Oral Every 12 hours 11/01/17 1033 11/01/17 1434   10/28/17 1600  ampicillin (OMNIPEN) 2 g in sodium chloride 0.9 % 100 mL IVPB  Status:  Discontinued     2 g 300 mL/hr over 20 Minutes Intravenous Every 4 hours 10/28/17 1301 11/01/17 1033   10/28/17 1200  vancomycin (VANCOCIN) 1,500 mg in sodium chloride 0.9 % 500 mL IVPB  Status:  Discontinued     1,500 mg 250 mL/hr over 120 Minutes Intravenous Every 48 hours 10/26/17 1705 10/27/17 0926   10/27/17 1200  ampicillin (OMNIPEN) 2 g in sodium chloride 0.9 % 100 mL IVPB  Status:  Discontinued     2 g 300 mL/hr over 20 Minutes Intravenous Every 6 hours 10/27/17 0928 10/28/17 1301   10/27/17 1000  ceFEPIme (MAXIPIME) 2 g in sodium chloride 0.9 % 100 mL IVPB  Status:  Discontinued     2 g 200 mL/hr over 30 Minutes Intravenous Every 24 hours 10/26/17 1705 10/27/17 0926   10/26/17 1645  ceFEPIme (MAXIPIME) 2 g in sodium chloride 0.9 % 100 mL  IVPB  Status:  Discontinued     2 g 200 mL/hr over 30 Minutes Intravenous  Once 10/26/17 1637 10/26/17 1642   10/26/17 1645  vancomycin (VANCOCIN) IVPB 1000 mg/200 mL premix  Status:  Discontinued     1,000 mg 200 mL/hr over 60 Minutes Intravenous  Once 10/26/17 1637 10/26/17 1642   10/26/17 1230  ceFEPIme (MAXIPIME) 2 g in sodium chloride 0.9 % 100 mL IVPB     2 g 200 mL/hr over 30 Minutes Intravenous  Once 10/26/17 1216 10/26/17 1316   10/26/17 1230  vancomycin (VANCOCIN) 1,500 mg in sodium chloride 0.9 % 500 mL IVPB     1,500 mg 250 mL/hr over 120 Minutes Intravenous  Once 10/26/17 1216 10/26/17 1607      Medications:  Scheduled: . sodium chloride   Intravenous Once  . insulin aspart  0-15 Units Subcutaneous TID WC  . insulin aspart  0-5 Units Subcutaneous QHS  . insulin glargine  15 Units Subcutaneous BID  . simvastatin  40 mg Oral QPM  . sodium chloride flush  3 mL Intravenous Q12H  . Warfarin - Pharmacist Dosing Inpatient   Does not apply q1800    Objective: Vital signs in last 24 hours: Temp:  [97.8  F (36.6 C)-99.1 F (37.3 C)] 98.3 F (36.8 C) (10/30 1318) Pulse Rate:  [78-106] 98 (10/30 1318) Resp:  [14-26] 22 (10/30 1318) BP: (119-179)/(60-89) 144/83 (10/30 1318) SpO2:  [95 %-100 %] 97 % (10/30 1318) Weight:  [76.8 kg] 76.8 kg (10/30 0815)   General appearance: alert, cooperative and no distress Resp: clear to auscultation bilaterally Cardio: regular rate and rhythm GI: normal findings: bowel sounds normal and soft, non-tender Extremities: no edema  Lab Results Recent Labs    11/01/17 0531 11/02/17 0556  WBC 10.0 11.8*  HGB 10.8* 10.9*  HCT 33.0* 33.5*  NA 140  --   K 4.0  --   CL 109  --   CO2 23  --   BUN 17  --   CREATININE 0.92  --    Liver Panel No results for input(s): PROT, ALBUMIN, AST, ALT, ALKPHOS, BILITOT, BILIDIR, IBILI in the last 72 hours. Sedimentation Rate No results for input(s): ESRSEDRATE in the last 72 hours. C-Reactive  Protein No results for input(s): CRP in the last 72 hours.  Microbiology: Recent Results (from the past 240 hour(s))  Blood culture (routine x 2)     Status: Abnormal   Collection Time: 10/26/17 12:34 PM  Result Value Ref Range Status   Specimen Description   Final    BLOOD RIGHT ARM Performed at [facility], [address] [place], [place] [address]    Special Requests   Final    BOTTLES DRAWN AEROBIC AND ANAEROBIC Blood Culture adequate volume Performed at [facility], [address] [place], [place] [address]    Culture  Setup Time   Final    GRAM POSITIVE COCCI IN BOTH AEROBIC AND ANAEROBIC BOTTLES CRITICAL RESULT CALLED TO, READ BACK BY AND VERIFIED WITH: PHARMD J LEGGE 10/27/17 AT 908 AM BY CM Performed at [facility], [address], [place], [place] [address]    Culture ENTEROCOCCUS FAECALIS (A)  Final   Report Status 10/29/2017 FINAL  Final   Organism ID, Bacteria ENTEROCOCCUS FAECALIS  Final      Susceptibility   Enterococcus faecalis - MIC*    AMPICILLIN <=2 SENSITIVE Sensitive     VANCOMYCIN 1 SENSITIVE Sensitive     GENTAMICIN SYNERGY SENSITIVE Sensitive     * ENTEROCOCCUS FAECALIS  Blood Culture ID Panel (Reflexed)     Status: Abnormal   Collection Time: 10/26/17 12:34 PM  Result Value Ref Range Status   Enterococcus species DETECTED (A) NOT DETECTED Final    Comment: CRITICAL RESULT CALLED TO, READ BACK BY AND VERIFIED WITH: PHARMD J LEGGE 10/27/17 AT 908 AM BY CM    Vancomycin resistance NOT DETECTED NOT DETECTED Final   Listeria monocytogenes NOT DETECTED NOT DETECTED Final   Staphylococcus species NOT DETECTED NOT DETECTED Final   Staphylococcus aureus (BCID) NOT DETECTED NOT DETECTED Final   Streptococcus species NOT DETECTED NOT DETECTED Final   Streptococcus agalactiae NOT DETECTED NOT DETECTED Final   Streptococcus pneumoniae NOT DETECTED NOT DETECTED Final   Streptococcus pyogenes NOT DETECTED NOT  DETECTED Final   Acinetobacter baumannii NOT DETECTED NOT DETECTED Final   Enterobacteriaceae species NOT DETECTED NOT DETECTED Final   Enterobacter cloacae complex NOT DETECTED NOT DETECTED Final   Escherichia coli NOT DETECTED NOT DETECTED Final   Klebsiella oxytoca NOT DETECTED NOT DETECTED Final   Klebsiella pneumoniae NOT DETECTED NOT DETECTED Final   Proteus species NOT DETECTED NOT DETECTED Final   Serratia marcescens NOT  DETECTED NOT DETECTED Final   Haemophilus influenzae NOT DETECTED NOT DETECTED Final   Neisseria meningitidis NOT DETECTED NOT DETECTED Final   Pseudomonas aeruginosa NOT DETECTED NOT DETECTED Final   Candida albicans NOT DETECTED NOT DETECTED Final   Candida glabrata NOT DETECTED NOT DETECTED Final   Candida krusei NOT DETECTED NOT DETECTED Final   Candida parapsilosis NOT DETECTED NOT DETECTED Final   Candida tropicalis NOT DETECTED NOT DETECTED Final    Comment: Performed at [facility], [address] [place], [place] [address]  Blood culture (routine x 2)     Status: None   Collection Time: 10/26/17 12:45 PM  Result Value Ref Range Status   Specimen Description   Final    BLOOD LEFT ARM Performed at [facility], [address] [place], [place] [address]    Special Requests   Final    BOTTLES DRAWN AEROBIC AND ANAEROBIC Blood Culture results may not be optimal due to an excessive volume of blood received in culture bottles Performed at [facility], [address], [place], [place] [address]    Culture   Final    NO GROWTH 5 DAYS Performed at [facility], [address] [place], [place] [address]    Report Status 10/31/2017 FINAL  Final  Urine culture     Status: Abnormal   Collection Time: 10/26/17 12:59 PM  Result Value Ref Range Status   Specimen Description   Final    URINE, RANDOM Performed at [facility], [address], [place], [place] [address]    Special Requests    Final    NONE Performed at [facility], [address], [place], [place] [address]    Culture MULTIPLE SPECIES PRESENT, SUGGEST RECOLLECTION (A)  Final   Report Status 10/27/2017 FINAL  Final  MRSA PCR Screening     Status: None   Collection Time: 10/26/17  5:44 PM  Result Value Ref Range Status   MRSA by PCR NEGATIVE NEGATIVE Final    Comment:        The GeneXpert MRSA Assay (FDA approved for NASAL specimens only), is one component of a comprehensive MRSA colonization surveillance program. It is not intended to diagnose MRSA infection nor to guide or monitor treatment for MRSA infections. Performed at [facility], [address] [place], [place] [address]   Culture, blood (Routine X 2) w Reflex to ID Panel     Status: None   Collection Time: 10/28/17  5:33 AM  Result Value Ref Range Status   Specimen Description   Final    BLOOD LEFT ANTECUBITAL Performed at [facility], [address] [place], [place] [address]    Special Requests   Final    BOTTLES DRAWN AEROBIC AND ANAEROBIC Blood Culture adequate volume Performed at [facility], [address] [place], [place] [address]    Culture   Final    NO GROWTH 5 DAYS Performed at [facility], [address] [place], [place] [address]    Report Status 11/02/2017 FINAL  Final    Studies/Results: Mr Abdomen W Wo Contrast  Result Date: 11/02/2017 CLINICAL DATA:  Abdominal infection. EXAM: MRI ABDOMEN WITHOUT AND WITH CONTRAST TECHNIQUE: Multiplanar multisequence MR imaging of the abdomen was performed both before and after the administration of intravenous contrast. CONTRAST:  10 cc Gadavist COMPARISON:  Multiple exams, including 05/29/2016 and 10/26/2017 CT scan FINDINGS:  Despite efforts by the technologist and patient, motion artifact is present on today's exam and could not be eliminated. This reduces exam sensitivity and specificity.  This is a common result when MRI abdomen is attempted in the inpatient setting where patients are less likely to be able to breath hold and cooperate in controlling motion. Lower chest: Trace right pleural effusion. Hepatobiliary: Nodular hepatic morphology compatible with cirrhosis. The peripheral triangular lesion in segment 7 of the liver posterolaterally measures 1.8 by 1.9 cm and has low T1 and equivocally increased T2 signal, and generally hypo enhances with respect to the rest of the liver although does have some internal enhancement which becomes isointense to the rest of the liver on delayed images. This lesion was documented is measuring 3.5 cm on the prior MRI abdomen from 05/17/2002, and measured 3.5 cm on the CT scan from 11/27/2006. Given the long-term lack of progression this lesion is felt to be benign. Tiny cyst in segment 3 of the liver, image 59/1003. Gallbladder unremarkable.  No appreciable biliary dilatation. Pancreas: 1.2 by 1.1 cm cystic lesion in the tail the pancreas, not changed from 05/29/2016. Otherwise unremarkable. Spleen:  Unremarkable Adrenals/Urinary Tract: Adrenal glands normal. Non rotated left kidney with a left kidney lower pole Bosniak category 1 cyst. There is abnormal right perirenal stranding along with accentuated enhancement along the right collecting system and right proximal ureter as well as mild right hydronephrosis. The patient has a ureteral stent in place and there is a filling defect posteriorly in the collecting system measuring 8 mm in diameter on image 50/6 compatible with a collecting system calculus. There is also a right kidney lower pole calculus as shown on recent CT. Low signal intensity along the posterior margin of the left kidney corresponds to the high density in this vicinity on recent CT. Stomach/Bowel: Mild wall thickening and potential fold effacement in the descending duodenum and proximal transverse duodenum, suspicious for duodenitis.  Vascular/Lymphatic: IVC filter noted. Aortoiliac atherosclerotic vascular disease. Uphill varices. Patent portal vein and splenic vein. Other: Perihepatic and perisplenic ascites along with ascites tracking in both paracolic gutters. Musculoskeletal: L2 compression fracture. Large Schmorl's node along the superior endplates of T12 and L1. IMPRESSION: 1. Cirrhosis and ascites. Uphill varices indicating portal venous hypertension. Patent portal vein and splenic vein. 2. Mild right hydronephrosis despite the right ureteral stent. Accentuated enhancement along the collecting system in ureteral wall is likely due to irritation. There a 8 mm calculus posteriorly in the right collecting system and a calculus in the right kidney lower pole. 3. 1.2 by 1.1 cm cystic lesion in the tail the pancreas, stable from 05/29/2016. This documents 17 months of stability. Follow up pancreatic protocol MRI with and without contrast is recommended in 2 years time. This recommendation follows ACR consensus guidelines: Management of Incidental Pancreatic Cysts: A White Paper of the ACR Incidental Findings Committee. J Am Coll Radiol 2017;14:911-923. 4. Trace right pleural effusion. 5. 1.9 cm peripheral triangular lesion in segment 7 of the liver has reduced in size back through 2004. This is felt to be a benign lesion such as atypical hemangioma. Given the long-term lack of progression this lesion is considered benign. 6.  Aortic Atherosclerosis (ICD10-I70.0). 7. Stable appearance of the L2 compression fracture. 8. Non rotated left kidney with low signal material along the posterior margin corresponding with the calcifications in this vicinity. Known left nephrolithiasis. Electronically Signed   By: Gaylyn Rong M.D.   On: 11/02/2017 10:40     Assessment/Plan:  Enterococcal bacteremia DM2 with hyperglycemia, acidosis UPJ stent10-12-94m, mild hydronephrosis, calculus AKI (resolved) Cirrhosis, varicies Pancreatic mass (stable  from 2018)  Total days of antibiotics:8amp  His TEE was (-) Would transition him to amoxil 1 g q8h when ready for d/c to complete 14 days of rx.  Repeat BCx 10-24 (-) Would ask his PCP to repeat his BCx at f/u when off anbx. To have further GU intervention tomorrow.   Available as needed.          Dustin Sax MD, FACP Infectious Diseases (pager) [phone] [email or web address] 11/03/2017, 4:17 PM  LOS: 8 days

## 2017-11-03 NOTE — Progress Notes (Signed)
Patient ID: [name], male   DOB: [birth year], 77 y.o.   MRN: [id number]  [name] was admitted with pyelonephritis and remains on amoxicillin.  He has a right ureteral and renal stone and is to have ureteroscopy tomorrow.  His INR is 1.9.    I will make him NPO p MN.

## 2017-11-03 NOTE — Progress Notes (Addendum)
ANTICOAGULATION CONSULT NOTE   Pharmacy Consult for Warfarin Indication: atrial fibrillation, pulmonary embolus, DVT and stroke  Allergies  Allergen Reactions  . Prednisone     Other reaction(s): Other (See Comments) unknown  . Zithromax [Azithromycin] Other (See Comments)    Weak and flulike symptoms    Patient Measurements: Height: 5\' 4"  (162.6 cm) Weight: 169 lb 5 oz (76.8 kg) IBW/kg (Calculated) : 59.2  Vital Signs: Temp: 98.3 F (36.8 C) (10/30 1318) Temp Source: Oral (10/30 1318) BP: 144/83 (10/30 1318) Pulse Rate: 98 (10/30 1318)  Labs: Recent Labs    11/01/17 0531 11/02/17 0556 11/03/17 0545  HGB 10.8* 10.9*  --   HCT 33.0* 33.5*  --   PLT 210 257  --   LABPROT 35.6* 32.1* 21.5*  INR 3.63 3.18 1.90  CREATININE 0.92  --   --     Estimated Creatinine Clearance: 64 mL/min (by C-G formula based on SCr of 0.92 mg/dL).   Medications:  Scheduled:  . sodium chloride   Intravenous Once  . insulin aspart  0-15 Units Subcutaneous TID WC  . insulin aspart  0-5 Units Subcutaneous QHS  . insulin glargine  15 Units Subcutaneous BID  . simvastatin  40 mg Oral QPM  . sodium chloride flush  3 mL Intravenous Q12H  . warfarin  0.5 mg Oral ONCE-1800  . Warfarin - Pharmacist Dosing Inpatient   Does not apply q1800   Infusions:  . ampicillin (OMNIPEN) IV 2 g (11/03/17 1042)    Assessment: [name] admitted 10/22 with urosepsis.  He is on warfarin PTA for PE/DVT 2002, Afib and stroke 2002. Pt has IVC filter.  Pharmacy consulted to dose warfarin.  PTA warfarin dose 3 mg daily except 2 mg on Mon/Fri.  Last dose PTA was 10/21. Admission INR 3.75  Today, 11/03/2017:  INR 1.9 is slightly subtherapeutic.   Pt received a reduced dose of warfarin (1 mg) on 10/22 - has been held since for elevated INR.   Hgb has been low but stable - no new labs today  Plt stable WNL - no new labs today  No bleeding or complications noted  Pt received 2 units of FFP 10/25  Diet:  carb modified  No major drug drug interactions. Antibiotics may prolong INR. INR can also be elevated in acute illness.  Goal of Therapy:  INR 2-3 Monitor platelets by anticoagulation protocol: Yes   Plan:   Warfarin 0.5 mg PO once this evening. Will give a reduced dose since patient has had supratherapeutic INR despite warfarin being held for several days  Per MD, no bridge therapy at this time. Ok to resume warfarin this evening  Daily PT/INR  CBC tomorrow morning  Monitor for signs and symptoms of bleeding.  Cindi Carbon, PharmD Clinical Pharmacist 11/03/17 1:33 PM  Addendum:  Received call from RN. Pt scheduled for urologic procedure tomorrow with Dr. Annabell Howells - urology office called and instructed to hold warfarin this evening.   Cindi Carbon, PharmD 11/03/17 3:03 PM

## 2017-11-03 NOTE — Interval H&P Note (Signed)
History and Physical Interval Note:  11/03/2017 9:04 AM  [name]  has presented today for surgery, with the diagnosis of bacteremia  The various methods of treatment have been discussed with the patient and family. After consideration of risks, benefits and other options for treatment, the patient has consented to  Procedure(s): TRANSESOPHAGEAL ECHOCARDIOGRAM (TEE) (N/A) as a surgical intervention .  The patient's history has been reviewed, patient examined, no change in status, stable for surgery.  I have reviewed the patient's chart and labs.  Questions were answered to the patient's satisfaction.     Chaquita Basques Cristal Deer

## 2017-11-03 NOTE — Progress Notes (Signed)
PT Cancellation Note  Patient Details Name: [name] MRN: [id number] DOB: October 11, 1940   Cancelled Treatment:    Reason Eval/Treat Not Completed: Patient at procedure or test/unavailablewill check back later as schedule allows.    Rada Hay 11/03/2017, 8:05 AM Blanchard Kelch PT Acute Rehabilitation Services Pager [phone] Office [phone]

## 2017-11-03 NOTE — Progress Notes (Signed)
PROGRESS NOTE    [name]  ZOX:[id number] DOB: 12/20/1940 DOA: 10/26/2017 PCP: [name], MD   Brief Narrative:  77 year old with past medical history relevant for DVT/PE status post IVC filter placement, paroxysmal atrial fibrillation on warfarin, hypertension, type 2 diabetes not on insulin, hyperlipidemia, nephrolithiasis status post right UPJ stent placement on 10/16/2017 for hydronephrosis , cirrhosis, pancreatic cyst (last MRI of abdomen on 05/29/2016 favors benign, repeat in 2 years), indeterminate liver lesion (MRI of abdomen on 05/29/2016 recommends repeat MRI in 3 months) who presents with profound weakness, sepsis, AKI found to have right-sided pyelonephritis with enterococcus bacteremia.   Assessment & Plan:   Principal Problem:   Severe sepsis with acute organ dysfunction (HCC) Active Problems:   HTN (hypertension)   DM II (diabetes mellitus, type II), controlled (HCC)   Closed compression fracture of body of lumbar vertebra (HCC)   AF (atrial fibrillation) (HCC)   Hyperlipidemia   History of stroke   Presence of inferior vena cava filter   Right ureteral stone   Sepsis (HCC)   ARF (acute renal failure) (HCC)   Metabolic acidosis   Pyelonephritis   Acute lower UTI   Status post placement of ureteral stent:R 10/15/2017   Dehydration   #) Enterococcus sepsis due to pyelonephritis: CT abdomen pelvis on admission showed evidence of pyelonephritis on the right. -Continue IV ampicillin started 10/27/2017 - blood cultures on 10/28/2017 NEG - Blood cultures from 10/26/2017 growing enterococcus -Echo on 10/27/2017 showed only grade 1 diastolic dysfunction - TEE 11/03/2017 negative for endocarditis -Infectious disease  #) Nephrolithiasis status post right UPJ stent on 10/16/2017: Repeat CT imaging on admission shows that renal pelvis is decompressed. -Discussed with urology, pending OR tomorrow with Dr. Annabell Howells - NPO at [place], hold warfarin, INR is 1.9  #) AKI:  Resolved  #) DVT/PE/atrial fibrillation: INR is improved with 2 units of FFP -hold warfarin for procedure tomorrow, pharmacy consult -DIC panel shows elevated fibrinogen but no evidence of DIC  #) Type 2 diabetes: Patient is only on home oral hypoglycemics however his blood sugars here been quite elevated so patient was started on insulin - Continue glargine to 15 units twice daily -Hold home glimepiride 4 mg every morning -Hold home glipizide 2.5 mg daily -Hold home metformin 500 mg twice daily  #) Cirrhosis/indeterminate liver lesion: Per MRI done on 05/29/2016 patient was noted to have cirrhosis and indeterminate liver lesion with recommendation for repeat MRI in 3 months.  This was not performed. -repeat MRI shows liver lesion is  Smaller and likely benign, does not need follow up.  #) Hypertension/hyperlipidemia: -Hold home losartan 100 mg every morning -Continue simvastatin 40 mg nightly  #) Pancreatic lesion: Patient noted to have unusual necrotic lesion in the tail of the pancreas.  MRI on 05/29/2016.  Benign etiology with recommendation to repeat in 2 years. - Plan for repeat MRI in 2 years, approximately 05/2018  Fluids: Tolerating p.o. Electrolytes: Monitor and supplement Nutrition: Carb restricted diet  Prophylaxis: Warfarin  Disposition: Pending skilled nursing facility placement and OR with Urology  Full code   Consultants:   Urology  Procedures:   None  Antimicrobials:  IV ampicillin started 10/27/2017   Subjective: Patient sleeping in bed. He has returned from his TEE.  He denies any nausea, vomiting, diarrhea, cough, congestion, rhinorrhea.  Objective: Vitals:   11/03/17 0940 11/03/17 0942 11/03/17 1011 11/03/17 1318  BP: 119/66 139/86 (!) 147/89 (!) 144/83  Pulse: (!) 106 (!) 103 100 98  Resp: (!) 25 (!) 22 14 (!) 22  Temp: 98.4 F (36.9 C)  97.8 F (36.6 C) 98.3 F (36.8 C)  TempSrc: Oral  Oral Oral  SpO2: 97% 97% 97% 97%  Weight:        Height:        Intake/Output Summary (Last 24 hours) at 11/03/2017 1525 Last data filed at 11/03/2017 1519 Gross per 24 hour  Intake 1040 ml  Output 2300 ml  Net -1260 ml   Filed Weights   11/01/17 0338 11/03/17 0420 11/03/17 0815  Weight: 81.5 kg 76.8 kg 76.8 kg    Examination:  General exam: Appears calm and comfortable  Respiratory system: Clear to auscultation. Respiratory effort normal. Cardiovascular system: Regular rate and rhythm, no murmurs. Gastrointestinal system: Soft, nondistended, nontender to palpation, no rebound or guarding, plus bowel sounds. Central nervous system: Alert and oriented. No focal neurological deficits. Extremities: Trace lower extremity edema Skin: No rashes over visible skin Psychiatry: Judgement and insight appear normal. Mood & affect appropriate.     Data Reviewed: I have personally reviewed following labs and imaging studies  CBC: Recent Labs  Lab 10/29/17 0523 10/29/17 1833 10/30/17 0515 10/31/17 0538 11/01/17 0531 11/02/17 0556  WBC 12.7*  --  12.7* 11.1* 10.0 11.8*  HGB 13.0  --  11.1* 11.9* 10.8* 10.9*  HCT 39.7  --  33.7* 36.0* 33.0* 33.5*  MCV 92.8  --  92.1 91.4 93.5 92.8  PLT 123* 138* 141* 194 210 257   Basic Metabolic Panel: Recent Labs  Lab 10/28/17 0531 10/29/17 0523 10/31/17 0538 11/01/17 0531  NA 136 133* 139 140  K 3.9 3.7 3.1* 4.0  CL 105 103 104 109  CO2 22 20* 25 23  GLUCOSE 241* 188* 147* 128*  BUN 27* 26* 22 17  CREATININE 1.04 0.88 0.97 0.92  CALCIUM 7.5* 7.4* 7.5* 7.4*  MG 1.9  --   --   --    GFR: Estimated Creatinine Clearance: 64 mL/min (by C-G formula based on SCr of 0.92 mg/dL). Liver Function Tests: Recent Labs  Lab 10/28/17 0531  AST 62*  ALT 46*  ALKPHOS 44  BILITOT 1.0  PROT 5.6*  ALBUMIN 2.4*   No results for input(s): LIPASE, AMYLASE in the last 168 hours. No results for input(s): AMMONIA in the last 168 hours. Coagulation Profile: Recent Labs  Lab 10/30/17 0515  10/31/17 0538 11/01/17 0531 11/02/17 0556 11/03/17 0545  INR 3.22 3.59 3.63 3.18 1.90   Cardiac Enzymes: No results for input(s): CKTOTAL, CKMB, CKMBINDEX, TROPONINI in the last 168 hours. BNP (last 3 results) No results for input(s): PROBNP in the last 8760 hours. HbA1C: No results for input(s): HGBA1C in the last 72 hours. CBG: Recent Labs  Lab 11/02/17 1220 11/02/17 1626 11/02/17 2142 11/03/17 0734 11/03/17 1143  GLUCAP 202* 169* 149* 120* 166*   Lipid Profile: No results for input(s): CHOL, HDL, LDLCALC, TRIG, CHOLHDL, LDLDIRECT in the last 72 hours. Thyroid Function Tests: No results for input(s): TSH, T4TOTAL, FREET4, T3FREE, THYROIDAB in the last 72 hours. Anemia Panel: No results for input(s): VITAMINB12, FOLATE, FERRITIN, TIBC, IRON, RETICCTPCT in the last 72 hours. Sepsis Labs: Recent Labs  Lab 10/28/17 0531  PROCALCITON 1.32    Recent Results (from the past 240 hour(s))  Blood culture (routine x 2)     Status: Abnormal   Collection Time: 10/26/17 12:34 PM  Result Value Ref Range Status   Specimen Description   Final    BLOOD RIGHT ARM  Performed at [facility], [address], [place], [place] [address]    Special Requests   Final    BOTTLES DRAWN AEROBIC AND ANAEROBIC Blood Culture adequate volume Performed at [facility], [address] [place], [place] [address]    Culture  Setup Time   Final    GRAM POSITIVE COCCI IN BOTH AEROBIC AND ANAEROBIC BOTTLES CRITICAL RESULT CALLED TO, READ BACK BY AND VERIFIED WITH: PHARMD J LEGGE 10/27/17 AT 908 AM BY CM Performed at [facility], [address], [place], [place] [address]    Culture ENTEROCOCCUS FAECALIS (A)  Final   Report Status 10/29/2017 FINAL  Final   Organism ID, Bacteria ENTEROCOCCUS FAECALIS  Final      Susceptibility   Enterococcus faecalis - MIC*    AMPICILLIN <=2 SENSITIVE Sensitive     VANCOMYCIN 1 SENSITIVE Sensitive     GENTAMICIN  SYNERGY SENSITIVE Sensitive     * ENTEROCOCCUS FAECALIS  Blood Culture ID Panel (Reflexed)     Status: Abnormal   Collection Time: 10/26/17 12:34 PM  Result Value Ref Range Status   Enterococcus species DETECTED (A) NOT DETECTED Final    Comment: CRITICAL RESULT CALLED TO, READ BACK BY AND VERIFIED WITH: PHARMD J LEGGE 10/27/17 AT 908 AM BY CM    Vancomycin resistance NOT DETECTED NOT DETECTED Final   Listeria monocytogenes NOT DETECTED NOT DETECTED Final   Staphylococcus species NOT DETECTED NOT DETECTED Final   Staphylococcus aureus (BCID) NOT DETECTED NOT DETECTED Final   Streptococcus species NOT DETECTED NOT DETECTED Final   Streptococcus agalactiae NOT DETECTED NOT DETECTED Final   Streptococcus pneumoniae NOT DETECTED NOT DETECTED Final   Streptococcus pyogenes NOT DETECTED NOT DETECTED Final   Acinetobacter baumannii NOT DETECTED NOT DETECTED Final   Enterobacteriaceae species NOT DETECTED NOT DETECTED Final   Enterobacter cloacae complex NOT DETECTED NOT DETECTED Final   Escherichia coli NOT DETECTED NOT DETECTED Final   Klebsiella oxytoca NOT DETECTED NOT DETECTED Final   Klebsiella pneumoniae NOT DETECTED NOT DETECTED Final   Proteus species NOT DETECTED NOT DETECTED Final   Serratia marcescens NOT DETECTED NOT DETECTED Final   Haemophilus influenzae NOT DETECTED NOT DETECTED Final   Neisseria meningitidis NOT DETECTED NOT DETECTED Final   Pseudomonas aeruginosa NOT DETECTED NOT DETECTED Final   Candida albicans NOT DETECTED NOT DETECTED Final   Candida glabrata NOT DETECTED NOT DETECTED Final   Candida krusei NOT DETECTED NOT DETECTED Final   Candida parapsilosis NOT DETECTED NOT DETECTED Final   Candida tropicalis NOT DETECTED NOT DETECTED Final    Comment: Performed at [facility], [address] [place], [place] [address]  Blood culture (routine x 2)     Status: None   Collection Time: 10/26/17 12:45 PM  Result Value Ref Range Status   Specimen Description    Final    BLOOD LEFT ARM Performed at [facility], [address], [place], [place] [address]    Special Requests   Final    BOTTLES DRAWN AEROBIC AND ANAEROBIC Blood Culture results may not be optimal due to an excessive volume of blood received in culture bottles Performed at [facility] [address], [place], [place] [address]    Culture   Final    NO GROWTH 5 DAYS Performed at [facility], [address] [place], [place] [address]    Report Status 10/31/2017 FINAL  Final  Urine culture  Status: Abnormal   Collection Time: 10/26/17 12:59 PM  Result Value Ref Range Status   Specimen Description   Final    URINE, RANDOM Performed at [facility], [address] [place], [place] [address]    Special Requests   Final    NONE Performed at [facility], [address], [place], [place] [address]    Culture MULTIPLE SPECIES PRESENT, SUGGEST RECOLLECTION (A)  Final   Report Status 10/27/2017 FINAL  Final  MRSA PCR Screening     Status: None   Collection Time: 10/26/17  5:44 PM  Result Value Ref Range Status   MRSA by PCR NEGATIVE NEGATIVE Final    Comment:        The GeneXpert MRSA Assay (FDA approved for NASAL specimens only), is one component of a comprehensive MRSA colonization surveillance program. It is not intended to diagnose MRSA infection nor to guide or monitor treatment for MRSA infections. Performed at [facility], [address], [place], [place] [address]   Culture, blood (Routine X 2) w Reflex to ID Panel     Status: None   Collection Time: 10/28/17  5:33 AM  Result Value Ref Range Status   Specimen Description   Final    BLOOD LEFT ANTECUBITAL Performed at [facility], [address] [place], [place] [address]    Special Requests   Final    BOTTLES DRAWN AEROBIC AND ANAEROBIC Blood Culture adequate volume Performed at  [facility], [address], [place], [place] [address]    Culture   Final    NO GROWTH 5 DAYS Performed at [facility], [address] [place], [place] [address]    Report Status 11/02/2017 FINAL  Final         Radiology Studies: Mr Abdomen W Wo Contrast  Result Date: 11/02/2017 CLINICAL DATA:  Abdominal infection. EXAM: MRI ABDOMEN WITHOUT AND WITH CONTRAST TECHNIQUE: Multiplanar multisequence MR imaging of the abdomen was performed both before and after the administration of intravenous contrast. CONTRAST:  10 cc Gadavist COMPARISON:  Multiple exams, including 05/29/2016 and 10/26/2017 CT scan FINDINGS: Despite efforts by the technologist and patient, motion artifact is present on today's exam and could not be eliminated. This reduces exam sensitivity and specificity. This is a common result when MRI abdomen is attempted in the inpatient setting where patients are less likely to be able to breath hold and cooperate in controlling motion. Lower chest: Trace right pleural effusion. Hepatobiliary: Nodular hepatic morphology compatible with cirrhosis. The peripheral triangular lesion in segment 7 of the liver posterolaterally measures 1.8 by 1.9 cm and has low T1 and equivocally increased T2 signal, and generally hypo enhances with respect to the rest of the liver although does have some internal enhancement which becomes isointense to the rest of the liver on delayed images. This lesion was documented is measuring 3.5 cm on the prior MRI abdomen from 05/17/2002, and measured 3.5 cm on the CT scan from 11/27/2006. Given the long-term lack of progression this lesion is felt to be benign. Tiny cyst in segment 3 of the liver, image 59/1003. Gallbladder unremarkable.  No appreciable biliary dilatation. Pancreas: 1.2 by 1.1 cm cystic lesion in the tail the pancreas, not changed from 05/29/2016. Otherwise unremarkable. Spleen:  Unremarkable Adrenals/Urinary Tract: Adrenal glands  normal. Non rotated left kidney with a left kidney lower pole Bosniak category 1 cyst. There is abnormal right perirenal stranding along with accentuated  enhancement along the right collecting system and right proximal ureter as well as mild right hydronephrosis. The patient has a ureteral stent in place and there is a filling defect posteriorly in the collecting system measuring 8 mm in diameter on image 50/6 compatible with a collecting system calculus. There is also a right kidney lower pole calculus as shown on recent CT. Low signal intensity along the posterior margin of the left kidney corresponds to the high density in this vicinity on recent CT. Stomach/Bowel: Mild wall thickening and potential fold effacement in the descending duodenum and proximal transverse duodenum, suspicious for duodenitis. Vascular/Lymphatic: IVC filter noted. Aortoiliac atherosclerotic vascular disease. Uphill varices. Patent portal vein and splenic vein. Other: Perihepatic and perisplenic ascites along with ascites tracking in both paracolic gutters. Musculoskeletal: L2 compression fracture. Large Schmorl's node along the superior endplates of T12 and L1. IMPRESSION: 1. Cirrhosis and ascites. Uphill varices indicating portal venous hypertension. Patent portal vein and splenic vein. 2. Mild right hydronephrosis despite the right ureteral stent. Accentuated enhancement along the collecting system in ureteral wall is likely due to irritation. There a 8 mm calculus posteriorly in the right collecting system and a calculus in the right kidney lower pole. 3. 1.2 by 1.1 cm cystic lesion in the tail the pancreas, stable from 05/29/2016. This documents 17 months of stability. Follow up pancreatic protocol MRI with and without contrast is recommended in 2 years time. This recommendation follows ACR consensus guidelines: Management of Incidental Pancreatic Cysts: A White Paper of the ACR Incidental Findings Committee. J Am Coll Radiol  2017;14:911-923. 4. Trace right pleural effusion. 5. 1.9 cm peripheral triangular lesion in segment 7 of the liver has reduced in size back through 2004. This is felt to be a benign lesion such as atypical hemangioma. Given the long-term lack of progression this lesion is considered benign. 6.  Aortic Atherosclerosis (ICD10-I70.0). 7. Stable appearance of the L2 compression fracture. 8. Non rotated left kidney with low signal material along the posterior margin corresponding with the calcifications in this vicinity. Known left nephrolithiasis. Electronically Signed   By: Gaylyn Rong M.D.   On: 11/02/2017 10:40        Scheduled Meds: . sodium chloride   Intravenous Once  . insulin aspart  0-15 Units Subcutaneous TID WC  . insulin aspart  0-5 Units Subcutaneous QHS  . insulin glargine  15 Units Subcutaneous BID  . simvastatin  40 mg Oral QPM  . sodium chloride flush  3 mL Intravenous Q12H  . warfarin  0.5 mg Oral ONCE-1800  . Warfarin - Pharmacist Dosing Inpatient   Does not apply q1800   Continuous Infusions: . ampicillin (OMNIPEN) IV 2 g (11/03/17 1042)     LOS: 8 days    Time spent: 35    Delaine Lame, MD [facility]  If 7PM-7AM, please contact night-coverage [email or web address] Password [email or web address] 11/03/2017, 3:25 PM

## 2017-11-03 NOTE — Discharge Summary (Addendum)
Physician Discharge Summary  SINGLETON HICKOX ZOX:[id number] DOB: [birth year] DOA: 10/26/2017  PCP: [name], MD  Admit date: 10/26/2017 Discharge date: 11/04/2017  Admitted From: Home Disposition: Skilled nursing facility  Recommendations for Outpatient Follow-up:  1. Follow up with PCP in 1-2 weeks 2. Please obtain BMP/CBC in one week, please repeat blood culture after being off antibiotics for 1 week 3. Please follow up with your urologist as an outpatient. 4. Please check INR with goal of 2-3  Home Health: No Equipment/Devices: No  Discharge Condition: Stable CODE STATUS: Full Diet recommendation: Carb Modified   Brief/Interim Summary:  #) Enterococcus sepsis due to pyelonephritis: Patient was admitted with sepsis with blood cultures growing enterococcus from 10/26/2017.  Patient was started on IV ampicillin in 10/27/2017.  An echo on 10/27/2017 showed only grade 1 diastolic dysfunction.  Repeat blood cultures on 10/28/2017 were negative.  Infectious disease recommended TEE which was performed on 11/03/2017 that showed no evidence of endocarditis.  Patient was discharged on oral amoxicillin to complete a total of 2 weeks of antibiotics.  #) Nephrolithiasis status post right UPJ stent on 10/16/2017: Patient did have a right UPJ stent placed for her nephrosis due to nephrolithiasis and recalcitrant pain.  This was done as an outpatient.  CT scan imaging on admission showed that this stent was in the appropriate place with no evidence of recurrence of hydronephrosis.  Discussed with urology over the phone and they recommended treating acute infection and they will continue to treat stones and outpatient.  Patient was planned for an outpatient urological procedure on 11/04/2017.  As he was still inpatient he was taken to the OR and had ureteroscopy.  He will have the stent removed as an outpatient next week.  #) AKI: This resolved with IV fluids.  #) DVT/PE/atrial fibrillation:  Patient initially was admitted with INR of 5-6.  His home warfarin was held.  His INR remained persistently elevated.  Due to sepsis and DIC panel was ordered however there is no evidence of DIC.  Patient had some improvement in INR of 2 units FFP.  On discharge his INR dropped to 1.9 and he was restarted on home warfarin.  He should have his INR checked as an outpatient.  #) Type 2 diabetes: Patient was only on home oral hypoglycemics however his blood sugars were quite elevated here.  He was started on insulin glargine 15 units twice daily.  His blood sugars are much improved on this.  He may restart his home oral hypoglycemics on discharge and check his blood sugars frequently.  He may be evaluated for outpatient initiation of insulin if this is deemed appropriate.   #) Cirrhosis/indeterminate liver lesion: Patient did have an MRI approximately 05/29/2016 that showed cirrhosis and indeterminate liver lesion with repeat imaging recommended in 3 months.  He never did get this imaging performed.  An MRI abdomen was performed here that showed the liver lesion was actually smaller.  It was recommended that it no longer needed any further evaluation.  #) Hypertension/hyperlipidemia: Patient's home losartan was held.  He was continued on home simvastatin.  He may restart his home losartan.  #) Pancreatic lesion: Patient was noted to have unusual necrotic lesion in pancreas of MRI performed on 05/29/2016.  The recommendation was to repeat the MRI in 2 years and approximately 05/2018  Discharge Diagnoses:  Principal Problem:   Severe sepsis with acute organ dysfunction (HCC) Active Problems:   HTN (hypertension)   DM II (diabetes mellitus, type  II), controlled (HCC)   Closed compression fracture of body of lumbar vertebra (HCC)   AF (atrial fibrillation) (HCC)   Hyperlipidemia   History of stroke   Presence of inferior vena cava filter   Right ureteral stone   Sepsis (HCC)   ARF (acute renal failure)  (HCC)   Metabolic acidosis   Pyelonephritis   Acute lower UTI   Status post placement of ureteral stent:R 10/15/2017   Dehydration    Discharge Instructions  Discharge Instructions    Call MD for:  difficulty breathing, headache or visual disturbances   Complete by:  As directed    Call MD for:  hives   Complete by:  As directed    Call MD for:  persistant nausea and vomiting   Complete by:  As directed    Call MD for:  redness, tenderness, or signs of infection (pain, swelling, redness, odor or green/yellow discharge around incision site)   Complete by:  As directed    Call MD for:  severe uncontrolled pain   Complete by:  As directed    Call MD for:  temperature >100.4   Complete by:  As directed    Diet - low sodium heart healthy   Complete by:  As directed    Discharge instructions   Complete by:  As directed    Please complete your antibiotics.  Please check your blood sugars frequently.   Increase activity slowly   Complete by:  As directed      Allergies as of 11/04/2017      Reactions   Prednisone    Other reaction(s): Other (See Comments) unknown   Zithromax [azithromycin] Other (See Comments)   Weak and flulike symptoms      Medication List    TAKE these medications   amoxicillin 500 MG tablet Commonly known as:  AMOXIL Take 2 tablets (1,000 mg total) by mouth every 8 (eight) hours for 5 days.   glimepiride 4 MG tablet Commonly known as:  AMARYL Take 4 mg by mouth every morning.   glipiZIDE 2.5 MG 24 hr tablet Commonly known as:  GLUCOTROL XL Take 2.5 mg by mouth daily.   HYDROcodone-acetaminophen 5-325 MG tablet Commonly known as:  NORCO/VICODIN Take 1-2 tablets by mouth every 6 (six) hours as needed.   losartan 100 MG tablet Commonly known as:  COZAAR Take 100 mg by mouth every morning.   metFORMIN 500 MG 24 hr tablet Commonly known as:  GLUCOPHAGE-XR Take 1,000 mg by mouth 2 (two) times daily.   ondansetron 4 MG tablet Commonly known  as:  ZOFRAN Take 1 tablet (4 mg total) by mouth every 8 (eight) hours as needed for nausea or vomiting.   oxyCODONE-acetaminophen 5-325 MG tablet Commonly known as:  PERCOCET/ROXICET Take 1 tablet by mouth every 6 (six) hours.   simvastatin 40 MG tablet Commonly known as:  ZOCOR Take 40 mg by mouth every evening.   warfarin 2 MG tablet Commonly known as:  COUMADIN Take by mouth as directed. 2 mg on Mon & Fri, 3 mg all other days       Contact information for follow-up providers    Hillery Aldo, NP On 11/11/2017.   Specialty:  Nurse Practitioner Why:  230pm Contact information: [address] [place] [address] [phone]            Contact information for after-discharge care    Destination    [facility] .  Service:  Skilled Nursing Contact information: [address] [place] [address] [phone]                 Allergies  Allergen Reactions  . Prednisone     Other reaction(s): Other (See Comments) unknown  . Zithromax [Azithromycin] Other (See Comments)    Weak and flulike symptoms    Consultations:  Infectious disease   Procedures/Studies: Ct Abdomen Pelvis Wo Contrast  Result Date: 10/26/2017 CLINICAL DATA:  Found down. On pain medication for kidney stones. Recent ureteral stent. Fever and hyperglycemia. History of diabetes, RIGHT stent placement October 15, 2017, kidney stones, pancreatic cyst, liver mass, cirrhosis, diverticulitis. EXAM: CT ABDOMEN AND PELVIS WITHOUT CONTRAST TECHNIQUE: Multidetector CT imaging of the abdomen and pelvis was performed following the standard protocol without IV contrast. COMPARISON:  CT abdomen and pelvis October 15, 2017 and MRI abdomen May 29, 2016 FINDINGS: LOWER CHEST: Lung bases are clear. The visualized heart size is normal. Advanced coronary artery calcifications, incompletely evaluated. No pericardial effusion. HEPATOBILIARY: Nodular  cirrhotic liver with enlarged LEFT lobe and caudate lobes. Subtle hypodense mass RIGHT lobe of the liver less conspicuous than prior imaging, likely technical. Negative non-contrast CT gallbladder. PANCREAS: Nonacute. 15 mm complex mass tail of pancreas, previously evaluated with MRI. Punctate calcifications and atrophy. SPLEEN: Mild splenomegaly. ADRENALS/URINARY TRACT: Kidneys are orthotopic, demonstrating normal size. New RIGHT nephroureteral stent with proximal retaining loop within renal pelvis. Increasing RIGHT perinephric fat stranding. 8 mm calculus renal pelvis remains. No hydronephrosis. 11 mm RIGHT lower pole nephrolithiasis. 12 mm LEFT lower pole nephrolithiasis, additional smaller LEFT nephrolithiasis. Stable exophytic 12 mm calcified LEFT renal mass. 3.4 cm cyst lower pole LEFT kidney. Limited assessment for renal masses by nonenhanced CT. Urinary bladder is well distended containing distal retaining loop. Normal adrenal glands. STOMACH/BOWEL: The stomach, small and large bowel are normal in course and caliber without inflammatory changes, sensitivity decreased by lack of enteric contrast. Small volume retained large bowel stool. A few scattered colonic diverticula noted. VASCULAR/LYMPHATIC: Aortoiliac vessels are normal in course and caliber. Mild calcific atherosclerosis. Inferior vena cava filter below the level the renal veins. Paraesophageal varicosities. No lymphadenopathy by CT size criteria. REPRODUCTIVE: Mild prostatomegaly. OTHER: No intraperitoneal free fluid or free air. Status post anterior abdominal wall herniorrhaphy. MUSCULOSKELETAL: Non-acute. Linear sclerosis RIGHT femoral head equivocal for avascular necrosis without collapse. Osteopenia. Old moderate to severe L1 compression fracture and mild L5 compression fracture. Scattered old Schmorl's nodes. IMPRESSION: 1. Interval placement of RIGHT nephroureteral stent with decompressed collecting system. New RIGHT perinephric fat stranding,  potential urinary tract infection. 2. Bilateral nonobstructing nephrolithiasis. 3. Cirrhosis and sequelae of portal hypertension without ascites. 4. 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 Aortic Atherosclerosis (ICD10-I70.0). Electronically Signed   By: Awilda Metro M.D.   On: 10/26/2017 14:29   Mr Abdomen W LK Contrast  Result Date: 11/02/2017 CLINICAL DATA:  Abdominal infection. EXAM: MRI ABDOMEN WITHOUT AND WITH CONTRAST TECHNIQUE: Multiplanar multisequence MR imaging of the abdomen was performed both before and after the administration of intravenous contrast. CONTRAST:  10 cc Gadavist COMPARISON:  Multiple exams, including 05/29/2016 and 10/26/2017 CT scan FINDINGS: Despite efforts by the technologist and patient, motion artifact is present on today's exam and could not be eliminated. This reduces exam sensitivity and specificity. This is a common result when MRI abdomen is attempted in the inpatient setting where patients are less likely to be able to breath hold and cooperate in controlling motion. Lower chest: Trace right pleural  effusion. Hepatobiliary: Nodular hepatic morphology compatible with cirrhosis. The peripheral triangular lesion in segment 7 of the liver posterolaterally measures 1.8 by 1.9 cm and has low T1 and equivocally increased T2 signal, and generally hypo enhances with respect to the rest of the liver although does have some internal enhancement which becomes isointense to the rest of the liver on delayed images. This lesion was documented is measuring 3.5 cm on the prior MRI abdomen from 05/17/2002, and measured 3.5 cm on the CT scan from 11/27/2006. Given the long-term lack of progression this lesion is felt to be benign. Tiny cyst in segment 3 of the liver, image 59/1003. Gallbladder unremarkable.  No appreciable biliary dilatation. Pancreas: 1.2 by 1.1 cm cystic lesion in the tail the pancreas, not changed from 05/29/2016. Otherwise unremarkable. Spleen:  Unremarkable  Adrenals/Urinary Tract: Adrenal glands normal. Non rotated left kidney with a left kidney lower pole Bosniak category 1 cyst. There is abnormal right perirenal stranding along with accentuated enhancement along the right collecting system and right proximal ureter as well as mild right hydronephrosis. The patient has a ureteral stent in place and there is a filling defect posteriorly in the collecting system measuring 8 mm in diameter on image 50/6 compatible with a collecting system calculus. There is also a right kidney lower pole calculus as shown on recent CT. Low signal intensity along the posterior margin of the left kidney corresponds to the high density in this vicinity on recent CT. Stomach/Bowel: Mild wall thickening and potential fold effacement in the descending duodenum and proximal transverse duodenum, suspicious for duodenitis. Vascular/Lymphatic: IVC filter noted. Aortoiliac atherosclerotic vascular disease. Uphill varices. Patent portal vein and splenic vein. Other: Perihepatic and perisplenic ascites along with ascites tracking in both paracolic gutters. Musculoskeletal: L2 compression fracture. Large Schmorl's node along the superior endplates of T12 and L1. IMPRESSION: 1. Cirrhosis and ascites. Uphill varices indicating portal venous hypertension. Patent portal vein and splenic vein. 2. Mild right hydronephrosis despite the right ureteral stent. Accentuated enhancement along the collecting system in ureteral wall is likely due to irritation. There a 8 mm calculus posteriorly in the right collecting system and a calculus in the right kidney lower pole. 3. 1.2 by 1.1 cm cystic lesion in the tail the pancreas, stable from 05/29/2016. This documents 17 months of stability. Follow up pancreatic protocol MRI with and without contrast is recommended in 2 years time. This recommendation follows ACR consensus guidelines: Management of Incidental Pancreatic Cysts: A White Paper of the ACR Incidental  Findings Committee. J Am Coll Radiol 2017;14:911-923. 4. Trace right pleural effusion. 5. 1.9 cm peripheral triangular lesion in segment 7 of the liver has reduced in size back through 2004. This is felt to be a benign lesion such as atypical hemangioma. Given the long-term lack of progression this lesion is considered benign. 6.  Aortic Atherosclerosis (ICD10-I70.0). 7. Stable appearance of the L2 compression fracture. 8. Non rotated left kidney with low signal material along the posterior margin corresponding with the calcifications in this vicinity. Known left nephrolithiasis. Electronically Signed   By: Gaylyn Rong M.D.   On: 11/02/2017 10:40   Dg Chest Portable 1 View  Result Date: 10/26/2017 CLINICAL DATA:  Weakness, rapid heart rate EXAM: PORTABLE CHEST 1 VIEW COMPARISON:  Chest x-ray of 05/28/2016 FINDINGS: No active infiltrate or effusion is seen. Mediastinal and hilar contours are unremarkable. The heart is mildly enlarged and stable. No acute bony abnormality is seen. IMPRESSION: Stable mild cardiomegaly.  No active lung disease.  Electronically Signed   By: Dwyane Dee M.D.   On: 10/26/2017 13:12   Dg C-arm 1-60 Min-no Report  Result Date: 11/04/2017 Fluoroscopy was utilized by the requesting physician.  No radiographic interpretation.   Dg C-arm 1-60 Min-no Report  Result Date: 10/15/2017 Fluoroscopy was utilized by the requesting physician.  No radiographic interpretation.   Ct Renal Stone Study  Result Date: 10/15/2017 CLINICAL DATA:  Right flank pain EXAM: CT ABDOMEN AND PELVIS WITHOUT CONTRAST TECHNIQUE: Multidetector CT imaging of the abdomen and pelvis was performed following the standard protocol without IV contrast. COMPARISON:  December 30, 2016 CT abdomen and pelvis; abdominal MRI May 29, 2016 FINDINGS: Lower chest: There is mild scarring in the lung bases. There are foci of coronary artery calcification. There are mildly prominent vascular structures in the  periesophageal region, presumed varices. Hepatobiliary: Liver contour is nodular, consistent with underlying hepatic cirrhosis. There is a stable wedge-shaped area of decreased attenuation along the periphery of the liver measuring 2.4 x 1.9 cm. No new liver lesions are evident on this noncontrast enhanced study. Gallbladder wall is not appreciably thickened. There is no biliary duct dilatation. Pancreas: There is no pancreatic mass or inflammatory focus. Spleen: No splenic lesions are appreciable on this noncontrast enhanced study. Spleen measures 13.3 x 12.6 x 7.1 cm with a measured splenic volume of 595 cubic cm. Adrenals/Urinary Tract: Adrenals bilaterally appear normal. There is a stable calcified lesion along the posterior aspect of the lower pole of the left kidney measuring 1.4 x 1.4 cm, benign in appearance. There is a cyst arising more anteriorly from the lower pole left kidney measuring 3.4 x 3.3 cm. There is moderate hydronephrosis on the right. There is no hydronephrosis on the left. There is a calculus in a lower pole left renal calyx measuring 1.2 x 1.1 cm. A calculus more superiorly in the left kidney measures 0.9 x 0.9 cm. There is a 1 mm nearby calculus in the upper pole left kidney. There is a 2 mm calculus also in the upper pole left kidney. On the right, there is a calculus in a lower pole calyx measuring 1.6 x 1.1 cm. There is a calculus at the right ureteropelvic junction measuring 1.1 x 0.8 cm. No other ureteral calculi are evident. Urinary bladder is midline with wall thickness within normal limits. Stomach/Bowel: There is no appreciable bowel wall thickening. There is no evident bowel obstruction. There is no free air or portal venous air. Vascular/Lymphatic: There is a filter in the inferior vena cava. There is aortoiliac atherosclerosis. No evident aneurysm. Major mesenteric arterial vessels appear patent. No adenopathy is appreciable in the abdomen or pelvis. Reproductive: There are  multiple prostatic calculi. Prostate is upper normal in size. Seminal vesicles appear normal. No well-defined pelvic mass. Other: Appendix absent. There is mesh in the lower abdominal region anteriorly consistent with previous hernia repair. No hernia currently apparent. There is thinning of the lateral rectus muscle on the left with fatty infiltration in this area, stable. There is no abscess or ascites in the abdomen or pelvis. There is stranding in the mesentery in the left lower quadrant which does not appear associated with bowel. There is no abnormal fluid in this area. Musculoskeletal: There is evidence of previous collapse of the L1 vertebral body with mild retropulsion of bone posteriorly, stable. There is multilevel arthropathy in the lower thoracic and lumbar spine regions. There is mild spinal stenosis at the L1 level due to the retropulsion from previous fracture. There  is severe spinal stenosis at L3-4 and L4-5 due to diffuse disc protrusion and bony hypertrophy. Moderate stenosis due to similar factors is noted at L2-3. Bones are osteoporotic. No blastic or lytic bone lesions. There are no evident intramuscular lesions. IMPRESSION: 1. Moderate hydronephrosis on the right. There is a calculus at the right ureteropelvic junction measuring 1.1 x 0.8 cm causing obstruction. 2. Multiple intrarenal calculi, more on the left than on the right. Several prostatic calculi also noted. 3. Hepatic cirrhosis, unchanged in appearance. Stable wedge-shaped lesion in the posterior segment right lobe of the liver, benign in appearance. 4. Somewhat nonspecific appearing mesenteric thickening in the left lower quadrant which appear separate from bowel. 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 There is no adenopathy or fluid in this area. 5. Severe spinal stenosis at L3-4 and L4-5 due to bony hypertrophy and diffuse disc protrusion. Moderate stenosis due to similar factors noted at L2-3. Bones osteoporotic. Extensive  multilevel arthropathy. Stable compression fracture at L1 with mild retropulsion of bone into the canal causing mild spinal stenosis at this level. 6.  Filter in the inferior vena cava. 7.  Splenic prominence.  There are periesophageal varices. 8.  Aortoiliac atherosclerosis. Aortic Atherosclerosis (ICD10-I70.0). Electronically Signed   By: Bretta Bang III M.D.   On: 10/15/2017 07:30   Echo 10/27/2017: - Left ventricle: The cavity size was normal. Wall thickness was   increased in a pattern of mild LVH. Systolic function was normal.   The estimated ejection fraction was in the range of 60% to 65%.   Wall motion was normal; there were no regional wall motion   abnormalities. Doppler parameters are consistent with abnormal   left ventricular relaxation (grade 1 diastolic dysfunction). - Aortic valve: Mildly to moderately calcified annulus. Moderately   thickened, mildly calcified leaflets. There was mild stenosis.   Valve area (VTI): 1.49 cm^2. Valve area (Vmax): 1.61 cm^2. Valve   area (Vmean): 1.65 cm^2.  TEE 11/03/2017:No evidence of vegetations seen on any of the valves. Only trivial valvular regurgitation.   Ureteroscopy 11/04/2017  Subjective:   Discharge Exam: Vitals:   11/04/17 1330 11/04/17 1351  BP:  (!) 151/80  Pulse:  77  Resp:  18  Temp: 98.1 F (36.7 C) 98.1 F (36.7 C)  SpO2:  97%   Vitals:   11/04/17 1227 11/04/17 1230 11/04/17 1330 11/04/17 1351  BP: 126/72 123/81  (!) 151/80  Pulse: 79 79  77  Resp: 14 16  18   Temp: 97.8 F (36.6 C)  98.1 F (36.7 C) 98.1 F (36.7 C)  TempSrc:    Oral  SpO2: 99% 99%  97%  Weight:      Height:       General exam: Appears calm and comfortable  Respiratory system: Clear to auscultation. Respiratory effort normal. Cardiovascular system: Regular rate and rhythm, no murmurs. Gastrointestinal system: Soft, nondistended, nontender to palpation, no rebound or guarding, plus bowel sounds. Central nervous system: Alert  and oriented. No focal neurological deficits. Extremities: Trace lower extremity edema Skin: No rashes over visible skin Psychiatry: Judgement and insight appear normal. Mood & affect appropriate.    The results of significant diagnostics from this hospitalization (including imaging, microbiology, ancillary and laboratory) are listed below for reference.     Microbiology: Recent Results (from the past 240 hour(s))  Blood culture (routine x 2)     Status: Abnormal   Collection Time: 10/26/17 12:34 PM  Result Value Ref Range Status   Specimen Description  Final    BLOOD RIGHT ARM Performed at [facility], [address], [place], [place] [address]    Special Requests   Final    BOTTLES DRAWN AEROBIC AND ANAEROBIC Blood Culture adequate volume Performed at [facility], [address] [place], [place] [address]    Culture  Setup Time   Final    GRAM POSITIVE COCCI IN BOTH AEROBIC AND ANAEROBIC BOTTLES CRITICAL RESULT CALLED TO, READ BACK BY AND VERIFIED WITH: PHARMD J LEGGE 10/27/17 AT 908 AM BY CM Performed at [facility], [address] [place], [place] [address]    Culture ENTEROCOCCUS FAECALIS (A)  Final   Report Status 10/29/2017 FINAL  Final   Organism ID, Bacteria ENTEROCOCCUS FAECALIS  Final      Susceptibility   Enterococcus faecalis - MIC*    AMPICILLIN <=2 SENSITIVE Sensitive     VANCOMYCIN 1 SENSITIVE Sensitive     GENTAMICIN SYNERGY SENSITIVE Sensitive     * ENTEROCOCCUS FAECALIS  Blood Culture ID Panel (Reflexed)     Status: Abnormal   Collection Time: 10/26/17 12:34 PM  Result Value Ref Range Status   Enterococcus species DETECTED (A) NOT DETECTED Final    Comment: CRITICAL RESULT CALLED TO, READ BACK BY AND VERIFIED WITH: PHARMD J LEGGE 10/27/17 AT 908 AM BY CM    Vancomycin resistance NOT DETECTED NOT DETECTED Final   Listeria monocytogenes NOT DETECTED NOT DETECTED Final   Staphylococcus species NOT  DETECTED NOT DETECTED Final   Staphylococcus aureus (BCID) NOT DETECTED NOT DETECTED Final   Streptococcus species NOT DETECTED NOT DETECTED Final   Streptococcus agalactiae NOT DETECTED NOT DETECTED Final   Streptococcus pneumoniae NOT DETECTED NOT DETECTED Final   Streptococcus pyogenes NOT DETECTED NOT DETECTED Final   Acinetobacter baumannii NOT DETECTED NOT DETECTED Final   Enterobacteriaceae species NOT DETECTED NOT DETECTED Final   Enterobacter cloacae complex NOT DETECTED NOT DETECTED Final   Escherichia coli NOT DETECTED NOT DETECTED Final   Klebsiella oxytoca NOT DETECTED NOT DETECTED Final   Klebsiella pneumoniae NOT DETECTED NOT DETECTED Final   Proteus species NOT DETECTED NOT DETECTED Final   Serratia marcescens NOT DETECTED NOT DETECTED Final   Haemophilus influenzae NOT DETECTED NOT DETECTED Final   Neisseria meningitidis NOT DETECTED NOT DETECTED Final   Pseudomonas aeruginosa NOT DETECTED NOT DETECTED Final   Candida albicans NOT DETECTED NOT DETECTED Final   Candida glabrata NOT DETECTED NOT DETECTED Final   Candida krusei NOT DETECTED NOT DETECTED Final   Candida parapsilosis NOT DETECTED NOT DETECTED Final   Candida tropicalis NOT DETECTED NOT DETECTED Final    Comment: Performed at [facility], [address] [place], [place] [address]  Blood culture (routine x 2)     Status: None   Collection Time: 10/26/17 12:45 PM  Result Value Ref Range Status   Specimen Description   Final    BLOOD LEFT ARM Performed at [facility], [address], [place], [place] [address]    Special Requests   Final    BOTTLES DRAWN AEROBIC AND ANAEROBIC Blood Culture results may not be optimal due to an excessive volume of blood received in culture bottles Performed at [facility], [address] [place], [place] [address]    Culture   Final    NO GROWTH 5 DAYS Performed at [facility], [address], [place], [place]  [address]    Report Status  10/31/2017 FINAL  Final  Urine culture     Status: Abnormal   Collection Time: 10/26/17 12:59 PM  Result Value Ref Range Status   Specimen Description   Final    URINE, RANDOM Performed at [facility], [address] [place], [place] [address]    Special Requests   Final    NONE Performed at [facility], [address] [place], [place] [address]    Culture MULTIPLE SPECIES PRESENT, SUGGEST RECOLLECTION (A)  Final   Report Status 10/27/2017 FINAL  Final  MRSA PCR Screening     Status: None   Collection Time: 10/26/17  5:44 PM  Result Value Ref Range Status   MRSA by PCR NEGATIVE NEGATIVE Final    Comment:        The GeneXpert MRSA Assay (FDA approved for NASAL specimens only), is one component of a comprehensive MRSA colonization surveillance program. It is not intended to diagnose MRSA infection nor to guide or monitor treatment for MRSA infections. Performed at [facility], [address], [place], [place] [address]   Culture, blood (Routine X 2) w Reflex to ID Panel     Status: None   Collection Time: 10/28/17  5:33 AM  Result Value Ref Range Status   Specimen Description   Final    BLOOD LEFT ANTECUBITAL Performed at [facility], [address], [place], [place] [address]    Special Requests   Final    BOTTLES DRAWN AEROBIC AND ANAEROBIC Blood Culture adequate volume Performed at [facility], [address] [place], [place] [address]    Culture   Final    NO GROWTH 5 DAYS Performed at [facility], [address] [place], [place] [address]    Report Status 11/02/2017 FINAL  Final     Labs: BNP (last 3 results) No results for input(s): BNP in the last 8760 hours. Basic Metabolic Panel: Recent Labs  Lab 10/29/17 0523 10/31/17 0538 11/01/17 0531  NA 133* 139 140  K 3.7 3.1* 4.0  CL 103 104 109  CO2 20* 25 23  GLUCOSE 188*  147* 128*  BUN 26* 22 17  CREATININE 0.88 0.97 0.92  CALCIUM 7.4* 7.5* 7.4*   Liver Function Tests: No results for input(s): AST, ALT, ALKPHOS, BILITOT, PROT, ALBUMIN in the last 168 hours. No results for input(s): LIPASE, AMYLASE in the last 168 hours. No results for input(s): AMMONIA in the last 168 hours. CBC: Recent Labs  Lab 10/30/17 0515 10/31/17 0538 11/01/17 0531 11/02/17 0556 11/04/17 0543  WBC 12.7* 11.1* 10.0 11.8* 12.2*  HGB 11.1* 11.9* 10.8* 10.9* 11.0*  HCT 33.7* 36.0* 33.0* 33.5* 33.1*  MCV 92.1 91.4 93.5 92.8 91.7  PLT 141* 194 210 257 252   Cardiac Enzymes: No results for input(s): CKTOTAL, CKMB, CKMBINDEX, TROPONINI in the last 168 hours. BNP: Invalid input(s): POCBNP CBG: Recent Labs  Lab 11/03/17 1143 11/03/17 1650 11/03/17 2115 11/04/17 0710 11/04/17 1245  GLUCAP 166* 147* 133* 143* 94   D-Dimer No results for input(s): DDIMER in the last 72 hours. Hgb A1c No results for input(s): HGBA1C in the last 72 hours. Lipid Profile No results for input(s): CHOL, HDL, LDLCALC, TRIG, CHOLHDL, LDLDIRECT in the last 72 hours. Thyroid function studies No results for input(s): TSH, T4TOTAL, T3FREE, THYROIDAB in the last 72 hours.  Invalid input(s): FREET3 Anemia work up No results for input(s): VITAMINB12, FOLATE, FERRITIN, TIBC, IRON, RETICCTPCT in the  last 72 hours. Urinalysis    Component Value Date/Time   COLORURINE AMBER (A) 10/26/2017 1259   APPEARANCEUR CLOUDY (A) 10/26/2017 1259   LABSPEC 1.020 10/26/2017 1259   PHURINE 6.0 10/26/2017 1259   GLUCOSEU >=500 (A) 10/26/2017 1259   HGBUR MODERATE (A) 10/26/2017 1259   BILIRUBINUR NEGATIVE 10/26/2017 1259   KETONESUR 5 (A) 10/26/2017 1259   PROTEINUR 100 (A) 10/26/2017 1259   UROBILINOGEN 0.2 06/09/2009 1446   NITRITE NEGATIVE 10/26/2017 1259   LEUKOCYTESUR SMALL (A) 10/26/2017 1259   Sepsis Labs Invalid input(s): PROCALCITONIN,  WBC,  LACTICIDVEN Microbiology Recent Results (from the past  240 hour(s))  Blood culture (routine x 2)     Status: Abnormal   Collection Time: 10/26/17 12:34 PM  Result Value Ref Range Status   Specimen Description   Final    BLOOD RIGHT ARM Performed at [facility], [address] [place], [place] [address]    Special Requests   Final    BOTTLES DRAWN AEROBIC AND ANAEROBIC Blood Culture adequate volume Performed at [facility], [address] [place], [place] [address]    Culture  Setup Time   Final    GRAM POSITIVE COCCI IN BOTH AEROBIC AND ANAEROBIC BOTTLES CRITICAL RESULT CALLED TO, READ BACK BY AND VERIFIED WITH: PHARMD J LEGGE 10/27/17 AT 908 AM BY CM Performed at [facility], [address] [place], [place] [address]    Culture ENTEROCOCCUS FAECALIS (A)  Final   Report Status 10/29/2017 FINAL  Final   Organism ID, Bacteria ENTEROCOCCUS FAECALIS  Final      Susceptibility   Enterococcus faecalis - MIC*    AMPICILLIN <=2 SENSITIVE Sensitive     VANCOMYCIN 1 SENSITIVE Sensitive     GENTAMICIN SYNERGY SENSITIVE Sensitive     * ENTEROCOCCUS FAECALIS  Blood Culture ID Panel (Reflexed)     Status: Abnormal   Collection Time: 10/26/17 12:34 PM  Result Value Ref Range Status   Enterococcus species DETECTED (A) NOT DETECTED Final    Comment: CRITICAL RESULT CALLED TO, READ BACK BY AND VERIFIED WITH: PHARMD J LEGGE 10/27/17 AT 908 AM BY CM    Vancomycin resistance NOT DETECTED NOT DETECTED Final   Listeria monocytogenes NOT DETECTED NOT DETECTED Final   Staphylococcus species NOT DETECTED NOT DETECTED Final   Staphylococcus aureus (BCID) NOT DETECTED NOT DETECTED Final   Streptococcus species NOT DETECTED NOT DETECTED Final   Streptococcus agalactiae NOT DETECTED NOT DETECTED Final   Streptococcus pneumoniae NOT DETECTED NOT DETECTED Final   Streptococcus pyogenes NOT DETECTED NOT DETECTED Final   Acinetobacter baumannii NOT DETECTED NOT DETECTED Final   Enterobacteriaceae species NOT  DETECTED NOT DETECTED Final   Enterobacter cloacae complex NOT DETECTED NOT DETECTED Final   Escherichia coli NOT DETECTED NOT DETECTED Final   Klebsiella oxytoca NOT DETECTED NOT DETECTED Final   Klebsiella pneumoniae NOT DETECTED NOT DETECTED Final   Proteus species NOT DETECTED NOT DETECTED Final   Serratia marcescens NOT DETECTED NOT DETECTED Final   Haemophilus influenzae NOT DETECTED NOT DETECTED Final   Neisseria meningitidis NOT DETECTED NOT DETECTED Final   Pseudomonas aeruginosa NOT DETECTED NOT DETECTED Final   Candida albicans NOT DETECTED NOT DETECTED Final   Candida glabrata NOT DETECTED NOT DETECTED Final   Candida krusei NOT DETECTED NOT DETECTED Final   Candida parapsilosis NOT DETECTED NOT DETECTED Final   Candida tropicalis NOT DETECTED NOT DETECTED Final    Comment: Performed at [facility], [address], [place], [place] [address]  Blood culture (routine x 2)     Status: None   Collection Time: 10/26/17 12:45 PM  Result Value Ref Range Status   Specimen Description   Final    BLOOD LEFT ARM Performed at [facility], [address] [place], [place] [address]    Special Requests   Final    BOTTLES DRAWN AEROBIC AND ANAEROBIC Blood Culture results may not be optimal due to an excessive volume of blood received in culture bottles Performed at [facility], [address], [place], [place] [address]    Culture   Final    NO GROWTH 5 DAYS Performed at [facility], [address], [place], [place] [address]    Report Status 10/31/2017 FINAL  Final  Urine culture     Status: Abnormal   Collection Time: 10/26/17 12:59 PM  Result Value Ref Range Status   Specimen Description   Final    URINE, RANDOM Performed at [facility], [address], [place], [place] [address]    Special Requests   Final    NONE Performed at [facility], [address], [place], [place]  [address]    Culture MULTIPLE SPECIES PRESENT, SUGGEST RECOLLECTION (A)  Final   Report Status 10/27/2017 FINAL  Final  MRSA PCR Screening     Status: None   Collection Time: 10/26/17  5:44 PM  Result Value Ref Range Status   MRSA by PCR NEGATIVE NEGATIVE Final    Comment:        The GeneXpert MRSA Assay (FDA approved for NASAL specimens only), is one component of a comprehensive MRSA colonization surveillance program. It is not intended to diagnose MRSA infection nor to guide or monitor treatment for MRSA infections. Performed at [facility], [address] [place], [place] [address]   Culture, blood (Routine X 2) w Reflex to ID Panel     Status: None   Collection Time: 10/28/17  5:33 AM  Result Value Ref Range Status   Specimen Description   Final    BLOOD LEFT ANTECUBITAL Performed at [facility], [address], [place], [place] [address]    Special Requests   Final    BOTTLES DRAWN AEROBIC AND ANAEROBIC Blood Culture adequate volume Performed at [facility], [address] [place] [address], [place], [place] [address]    Culture   Final    NO GROWTH 5 DAYS Performed at [facility], [address], [place], [place] [address]    Report Status 11/02/2017 FINAL  Final     Time coordinating discharge: 35  SIGNED:   Delaine Lame, MD  [facility] 11/04/2017, 4:10 PM  If 7PM-7AM, please contact night-coverage [email or web address] Password [email or web address]

## 2017-11-03 NOTE — Progress Notes (Signed)
Received call from Karen,RN in [facility] informing this RN that pt is presently on surgery schedule for tomorrow (11/04/17) by Dr. Annabell Howells.  Call placed to Dr. Belva Crome office and this RN spoke with office nurse and surgery scheduler.  Per surgery scheduler at [facility] pt is "cleared" for surgery tomorrow 11/04/17.   Dr. Clearnce Sorrel notified of plans for procedure on 11/04/17 and Dr. Clearnce Sorrel consulted with Dr. Annabell Howells.  This RN was told to hold coumadin dose for tonight and make pt NPO after MN. Maeola Harman

## 2017-11-04 ENCOUNTER — Inpatient Hospital Stay (HOSPITAL_COMMUNITY): Payer: Medicare Other | Admitting: Anesthesiology

## 2017-11-04 ENCOUNTER — Encounter (HOSPITAL_COMMUNITY)
Admission: EM | Disposition: A | Discharge status: Skilled Nursing Facility | Payer: Self-pay | Source: Home / Self Care | Attending: Internal Medicine

## 2017-11-04 ENCOUNTER — Inpatient Hospital Stay (HOSPITAL_COMMUNITY): Payer: Medicare Other

## 2017-11-04 ENCOUNTER — Encounter (HOSPITAL_COMMUNITY): Payer: Self-pay

## 2017-11-04 ENCOUNTER — Ambulatory Visit (HOSPITAL_COMMUNITY): Admission: RE | Admit: 2017-11-04 | Payer: Medicare Other | Source: Ambulatory Visit | Admitting: Urology

## 2017-11-04 HISTORY — PX: CYSTOSCOPY/URETEROSCOPY/HOLMIUM LASER/STENT PLACEMENT: SHX6546

## 2017-11-04 LAB — CBC
HCT: 33.1 % — ABNORMAL LOW (ref 39.0–52.0)
Hemoglobin: 11 g/dL — ABNORMAL LOW (ref 13.0–17.0)
MCH: 30.5 pg (ref 26.0–34.0)
MCHC: 33.2 g/dL (ref 30.0–36.0)
MCV: 91.7 fL (ref 80.0–100.0)
Platelets: 252 10*3/uL (ref 150–400)
RBC: 3.61 MIL/uL — ABNORMAL LOW (ref 4.22–5.81)
RDW: 14.3 % (ref 11.5–15.5)
WBC: 12.2 K/uL — ABNORMAL HIGH (ref 4.0–10.5)
nRBC: 0 % (ref 0.0–0.2)

## 2017-11-04 LAB — GLUCOSE, CAPILLARY
GLUCOSE-CAPILLARY: 320 mg/dL — AB (ref 70–99)
GLUCOSE-CAPILLARY: 143 mg/dL — AB (ref 70–99)
GLUCOSE-CAPILLARY: 94 mg/dL (ref 70–99)
Glucose-Capillary: 167 mg/dL — ABNORMAL HIGH (ref 70–99)

## 2017-11-04 LAB — PROTIME-INR
INR: 1.63
Prothrombin Time: 19.1 seconds — ABNORMAL HIGH (ref 11.4–15.2)

## 2017-11-04 SURGERY — CYSTOSCOPY/URETEROSCOPY/HOLMIUM LASER/STENT PLACEMENT
Anesthesia: General | Laterality: Right

## 2017-11-04 MED ORDER — PROPOFOL 10 MG/ML IV BOLUS
INTRAVENOUS | Status: DC | PRN
  Administered 2017-11-04: 130 mg via INTRAVENOUS

## 2017-11-04 MED ORDER — DEXAMETHASONE SODIUM PHOSPHATE 10 MG/ML IJ SOLN
INTRAMUSCULAR | Status: AC
  Filled 2017-11-04: qty 1

## 2017-11-04 MED ORDER — FENTANYL CITRATE (PF) 100 MCG/2ML IJ SOLN: 25.0000 ug | INTRAMUSCULAR | Status: DC | PRN

## 2017-11-04 MED ORDER — PHENYLEPHRINE 40 MCG/ML (10ML) SYRINGE FOR IV PUSH (FOR BLOOD PRESSURE SUPPORT)
PREFILLED_SYRINGE | INTRAVENOUS | Status: DC | PRN
  Administered 2017-11-04 (×13): 80 ug via INTRAVENOUS

## 2017-11-04 MED ORDER — PHENYLEPHRINE 40 MCG/ML (10ML) SYRINGE FOR IV PUSH (FOR BLOOD PRESSURE SUPPORT)
PREFILLED_SYRINGE | INTRAVENOUS | Status: AC
  Filled 2017-11-04: qty 10

## 2017-11-04 MED ORDER — ONDANSETRON HCL 4 MG/2ML IJ SOLN
INTRAMUSCULAR | Status: AC
  Filled 2017-11-04: qty 2

## 2017-11-04 MED ORDER — SODIUM CHLORIDE 0.9 % IR SOLN
Status: DC | PRN
  Administered 2017-11-04: 6000 mL via INTRAVESICAL

## 2017-11-04 MED ORDER — FENTANYL CITRATE (PF) 100 MCG/2ML IJ SOLN
INTRAMUSCULAR | Status: AC
  Filled 2017-11-04: qty 2

## 2017-11-04 MED ORDER — CEFAZOLIN SODIUM-DEXTROSE 2-4 GM/100ML-% IV SOLN: 2.0000 g | INTRAVENOUS | Status: DC

## 2017-11-04 MED ORDER — LIDOCAINE 2% (20 MG/ML) 5 ML SYRINGE
INTRAMUSCULAR | Status: AC
  Filled 2017-11-04: qty 5

## 2017-11-04 MED ORDER — IOHEXOL 300 MG/ML  SOLN
INTRAMUSCULAR | Status: DC | PRN
  Administered 2017-11-04: 3 mL via URETHRAL

## 2017-11-04 MED ORDER — OXYCODONE HCL 5 MG PO TABS: 5.0000 mg | ORAL_TABLET | Freq: Once | ORAL | Status: DC | PRN

## 2017-11-04 MED ORDER — AMOXICILLIN 500 MG PO TABS: 1000.0000 mg | ORAL_TABLET | Freq: Three times a day (TID) | ORAL | 0 refills | Status: DC

## 2017-11-04 MED ORDER — LIDOCAINE 2% (20 MG/ML) 5 ML SYRINGE
INTRAMUSCULAR | Status: DC | PRN
  Administered 2017-11-04: 100 mg via INTRAVENOUS

## 2017-11-04 MED ORDER — OXYCODONE HCL 5 MG/5ML PO SOLN
5.0000 mg | Freq: Once | ORAL | Status: DC | PRN
  Filled 2017-11-04: qty 5

## 2017-11-04 MED ORDER — AMOXICILLIN 500 MG PO TABS: 1000.0000 mg | ORAL_TABLET | Freq: Three times a day (TID) | ORAL | 0 refills | Status: AC

## 2017-11-04 MED ORDER — FENTANYL CITRATE (PF) 100 MCG/2ML IJ SOLN
INTRAMUSCULAR | Status: DC | PRN
  Administered 2017-11-04: 50 ug via INTRAVENOUS
  Administered 2017-11-04 (×2): 25 ug via INTRAVENOUS

## 2017-11-04 MED ORDER — LACTATED RINGERS IV SOLN
INTRAVENOUS | Status: DC
  Administered 2017-11-04: 10:00:00 via INTRAVENOUS

## 2017-11-04 MED ORDER — 0.9 % SODIUM CHLORIDE (POUR BTL) OPTIME
TOPICAL | Status: DC | PRN
  Administered 2017-11-04: 1000 mL

## 2017-11-04 MED ORDER — ONDANSETRON HCL 4 MG/2ML IJ SOLN: 4.0000 mg | Freq: Once | INTRAMUSCULAR | Status: DC | PRN

## 2017-11-04 MED ORDER — PROPOFOL 10 MG/ML IV BOLUS
INTRAVENOUS | Status: AC
  Filled 2017-11-04: qty 20

## 2017-11-04 MED ORDER — WARFARIN SODIUM 1 MG PO TABS
1.0000 mg | ORAL_TABLET | Freq: Once | ORAL | Status: AC
  Administered 2017-11-04: 1 mg via ORAL
  Filled 2017-11-04: qty 1

## 2017-11-04 MED ORDER — PHENYLEPHRINE HCL 10 MG/ML IJ SOLN
INTRAMUSCULAR | Status: AC
  Filled 2017-11-04: qty 1

## 2017-11-04 SURGICAL SUPPLY — 25 items
BAG URO CATCHER STRL LF (MISCELLANEOUS) ×2 IMPLANT
BASKET STONE NCOMPASS (UROLOGICAL SUPPLIES) IMPLANT
CATH URET 5FR 28IN OPEN ENDED (CATHETERS) IMPLANT
CATH URET DUAL LUMEN 6-10FR 50 (CATHETERS) ×1 IMPLANT
CLOTH BEACON ORANGE TIMEOUT ST (SAFETY) ×2 IMPLANT
COVER WAND RF STERILE (DRAPES) IMPLANT
EXTRACTOR STONE NITINOL NGAGE (UROLOGICAL SUPPLIES) ×2 IMPLANT
FIBER LASER FLEXIVA 1000 (UROLOGICAL SUPPLIES) IMPLANT
FIBER LASER FLEXIVA 365 (UROLOGICAL SUPPLIES) IMPLANT
FIBER LASER FLEXIVA 550 (UROLOGICAL SUPPLIES) IMPLANT
FIBER LASER TRAC TIP (UROLOGICAL SUPPLIES) ×1 IMPLANT
GLOVE SURG SS PI 8.0 STRL IVOR (GLOVE) ×1 IMPLANT
GOWN STRL REUS W/TWL XL LVL3 (GOWN DISPOSABLE) ×2 IMPLANT
GUIDEWIRE STR DUAL SENSOR (WIRE) ×2 IMPLANT
IV NS 1000ML (IV SOLUTION)
IV NS 1000ML BAXH (IV SOLUTION) ×1 IMPLANT
IV NS IRRIG 3000ML ARTHROMATIC (IV SOLUTION) ×1 IMPLANT
MANIFOLD NEPTUNE II (INSTRUMENTS) ×2 IMPLANT
PACK CYSTO (CUSTOM PROCEDURE TRAY) ×2 IMPLANT
SHEATH URETERAL 12FRX35CM (MISCELLANEOUS) ×1 IMPLANT
SHEATH URETERAL 12FRX55CM (UROLOGICAL SUPPLIES) ×1 IMPLANT
STENT CONTOUR 6FRX24X.038 (STENTS) ×1 IMPLANT
STENT URET 6FRX24 CONTOUR (STENTS) ×1 IMPLANT
TUBING CONNECTING 10 (TUBING) ×2 IMPLANT
TUBING UROLOGY SET (TUBING) ×2 IMPLANT

## 2017-11-04 NOTE — Anesthesia Procedure Notes (Signed)
Procedure Name: LMA Insertion Date/Time: 11/04/2017 10:45 AM Performed by: Doran Clay, CRNA Pre-anesthesia Checklist: Patient identified, Emergency Drugs available, Suction available, Patient being monitored and Timeout performed Patient Re-evaluated:Patient Re-evaluated prior to induction Oxygen Delivery Method: Circle system utilized Preoxygenation: Pre-oxygenation with 100% oxygen Induction Type: IV induction Ventilation: Mask ventilation without difficulty LMA: LMA inserted LMA Size: 4.0 Number of attempts: 1 Dental Injury: Teeth and Oropharynx as per pre-operative assessment

## 2017-11-04 NOTE — Anesthesia Postprocedure Evaluation (Signed)
Anesthesia Post Note  Patient: [name]  Procedure(s) Performed: CYSTOSCOPY/RIGHT URETEROSCOPY/HOLMIUM LASER/STENT EXCHANGE (Right )     Patient location during evaluation: PACU Anesthesia Type: General Level of consciousness: awake and alert Pain management: pain level controlled Vital Signs Assessment: post-procedure vital signs reviewed and stable Respiratory status: spontaneous breathing, nonlabored ventilation and respiratory function stable Cardiovascular status: blood pressure returned to baseline and stable Postop Assessment: no apparent nausea or vomiting Anesthetic complications: no    Last Vitals:  Vitals:   11/04/17 1227 11/04/17 1230  BP: 126/72 123/81  Pulse: 79 79  Resp: 14 16  Temp: 36.6 C   SpO2: 99% 99%    Last Pain:  Vitals:   11/04/17 1227  TempSrc:   PainSc: Asleep                 Lucretia Kern

## 2017-11-04 NOTE — Transfer of Care (Signed)
Immediate Anesthesia Transfer of Care Note  Patient: [name]  Procedure(s) Performed: CYSTOSCOPY/RIGHT URETEROSCOPY/HOLMIUM LASER/STENT EXCHANGE (Right )  Patient Location: PACU  Anesthesia Type:General  Level of Consciousness: sedated  Airway & Oxygen Therapy: Patient Spontanous Breathing and Patient connected to face mask oxygen  Post-op Assessment: Report given to RN and Post -op Vital signs reviewed and stable  Post vital signs: Reviewed and stable  Last Vitals:  Vitals Value Taken Time  BP 126/72 11/04/2017 12:27 PM  Temp    Pulse 78 11/04/2017 12:28 PM  Resp 16 11/04/2017 12:28 PM  SpO2 99 % 11/04/2017 12:28 PM  Vitals shown include unvalidated device data.  Last Pain:  Vitals:   11/04/17 1015  TempSrc:   PainSc: 0-No pain      Patients Stated Pain Goal: 3 (11/03/17 1011)  Complications: No apparent anesthesia complications

## 2017-11-04 NOTE — Anesthesia Preprocedure Evaluation (Addendum)
Anesthesia Evaluation  Patient identified by MRN, date of birth, ID band Patient awake    Reviewed: Allergy & Precautions, NPO status , Patient's Chart, lab work & pertinent test results  History of Anesthesia Complications Negative for: history of anesthetic complications  Airway Mallampati: III  TM Distance: >3 FB Neck ROM: Full    Dental no notable dental hx.    Pulmonary neg pulmonary ROS,    Pulmonary exam normal        Cardiovascular hypertension, + DVT (2002, s/p IVC filter)  Normal cardiovascular exam+ dysrhythmias Atrial Fibrillation + Valvular Problems/Murmurs (mild per TTE on 10/27/17 (valve area 1.49 cm2)) AS   TTE 10/27/17: Study Conclusions - Left ventricle: The cavity size was normal. Wall thickness was   increased in a pattern of mild LVH. Systolic function was normal.   The estimated ejection fraction was in the range of 60% to 65%.   Wall motion was normal; there were no regional wall motion   abnormalities. Doppler parameters are consistent with abnormal   left ventricular relaxation (grade 1 diastolic dysfunction). - Aortic valve: Mildly to moderately calcified annulus. Moderately   thickened, mildly calcified leaflets. There was mild stenosis.   Valve area (VTI): 1.49 cm^2. Valve area (Vmax): 1.61 cm^2. Valve   area (Vmean): 1.65 cm^2.   Neuro/Psych CVA (2002) negative psych ROS   GI/Hepatic negative GI ROS, (+) Cirrhosis       ,   Endo/Other  diabetes, Oral Hypoglycemic Agents  Renal/GU Renal disease (UPJ obstruction- presented with urosepsis and AKI (now resolved))     Musculoskeletal negative musculoskeletal ROS (+)   Abdominal   Peds  Hematology negative hematology ROS (+)   Anesthesia Other Findings   Reproductive/Obstetrics                           Anesthesia Physical Anesthesia Plan  ASA: III  Anesthesia Plan: General   Post-op Pain Management:     Induction: Intravenous  PONV Risk Score and Plan: 3 and Ondansetron and Treatment may vary due to age or medical condition  Airway Management Planned:   Additional Equipment: None  Intra-op Plan:   Post-operative Plan: Extubation in OR  Informed Consent: I have reviewed the patients History and Physical, chart, labs and discussed the procedure including the risks, benefits and alternatives for the proposed anesthesia with the patient or authorized representative who has indicated his/her understanding and acceptance.     Plan Discussed with:   Anesthesia Plan Comments:        Anesthesia Quick Evaluation

## 2017-11-04 NOTE — Progress Notes (Signed)
Nutrition Brief Note  Patient identified for LOS (day #9).  Wt Readings from Last 15 Encounters:  11/04/17 75.3 kg  10/15/17 76.8 kg  01/26/17 79.6 kg  06/16/16 79.6 kg  06/02/16 79.4 kg  05/28/16 79.4 kg  10/08/11 80.1 kg  09/11/11 81.6 kg  08/27/11 80.8 kg  08/21/11 81.6 kg    Body mass index is 28.48 kg/m. Patient meets criteria for overweight based on current BMI. Current weight is 166 lb and weight on 10/15/17 was 169 lb. This indicates 3 lb weight loss (1.8% body weight) in the past 20 days; not significant for time frame.   Current diet order is NPO for ureteroscopy. Patient has been NPO several times during hospitalization and otherwise on Carb Modified diet. During the times patient has a diet order, he has been eating 100% of meals. Labs and medications reviewed.   No nutrition interventions warranted at this time. If nutrition issues arise, please consult RD.     Trenton Gammon, MS, RD, LDN, [facility] Inpatient Clinical Dietitian Pager # [phone] After hours/weekend pager # [phone]

## 2017-11-04 NOTE — Op Note (Signed)
Procedure: 1.  Cystoscopy with removal of right double-J stent. 2.  Right ureteroscopy with holmium laser application, stone retrieval and reinsertion of right double-J stent.  Preop diagnosis: Right proximal ureteral and renal stones.  Postop diagnosis: Right mid and lower pole renal stones.  Surgeon: Dr. Bjorn Pippin.  Anesthesia: General.  Drain: 6 French by 24 cm contour double-J stent on the right with tether.  Specimen: Stone fragments.  EBL: Minimal.  Complications: None.  Indications: [name] is a 77 year old white male with history of urolithiasis who had a right ureteral stent placed a few weeks ago for a right proximal ureteral stone.  He was readmitted on 10/22 with sepsis despite the stent in good position with a decompressed kidney.  He is currently on ampicillin per cultures and is to undergo ureteroscopic management of the stones.  Procedure: He remained on ampicillin.  He was taken operating room where general anesthetic was induced.  He was placed in lithotomy position and PAS hose were placed.  His perineum and genitalia were prepped with Betadine solution and he was draped in usual sterile fashion.  Cystoscopy was performed using a 23 [place] scope and 30 degree lens.  Examination revealed a normal urethra.  The external sphincter was intact.  The prostatic urethra was approximately 3 cm in length with bilobar hyperplasia.  The bladder a mild trabeculation but no tumors were noted.  Ureteral orifices were in the normal anatomic position and there was a stent at the right ureteral orifice.  The right ureteral stent was grasped with a grasping forceps and pulled the urethral meatus.  A sensor wire was then passed to the kidney and the stent was removed.  A 55 cm 12/14 French ureteral access sheath was passed over the wire to the kidney and the inner core and wire were removed.  The dual-lumen digital flexible ureteroscope was then passed to the kidney and the mid pole stone  was identified.  The stone was engaged to the 200 m tract tip laser fiber which was initially set on 0.5 J and 10 Hz but after initial fragmentation the rate was increased to 50 Hz.  The stone was readily fragmented in the sand and manageable fragments.  An engage basket was then used to remove the bulk of the fragments leaving only dust and grit.  Initial attempts to find the infundibulum to the lower pole calyx with the larger stone were unsuccessful.  Contrast was instilled through the ureteroscope and the infundibulum was identified.  I was then able to negotiate the scope into the infundibulum with the stone.  The stone was also engaged with the 200 m tract tip laser fiber and broken into manageable fragments the bulk of which were then removed with the engage basket.  When all significant fragments had been removed, the ureteroscope was removed and a sensor wire was passed back to the kidney.  The access sheath was removed and the cystoscope was reinserted over the wire.  A 6 French by 24 cm contour double-J stent was inserted over the wire to the kidney under fluoroscopic guidance.  The wire was removed leaving a good coil of stent in the kidney and in the bladder.  The cystoscope was removed leaving the stent string exiting the urethra.  The scope was then reinserted along with straining and some residual stone material was evacuated from the bladder the bladder was drained and the scope was removed.  He was taken down from lithotomy position, his anesthetic was  reversed and he was moved to recovery room in stable condition.  There were no complications. the stone fragments were given to the patient.

## 2017-11-04 NOTE — Clinical Social Work Placement (Signed)
   CLINICAL SOCIAL WORK PLACEMENT  NOTE  Date:  11/04/2017  Patient Details  Name: [name] MRN: [id number] Date of Birth: 1940/12/30  Clinical Social Work is seeking post-discharge placement for this patient at the Skilled  Nursing Facility level of care (*CSW will initial, date and re-position this form in  chart as items are completed):  Yes   Patient/family provided with [facility] Clinical Social Work Department's list of facilities offering this level of care within the geographic area requested by the patient (or if unable, by the patient's family).  Yes   Patient/family informed of their freedom to choose among providers that offer the needed level of care, that participate in Medicare, Medicaid or managed care program needed by the patient, have an available bed and are willing to accept the patient.  Yes   Patient/family informed of [facility]'s ownership interest in [facility] and [facility], as well as of the fact that they are under no obligation to receive care at these facilities.  PASRR submitted to EDS on   10/30/17  PASRR number received on     10/30/17  Existing PASRR number confirmed on     10/30/17  FL2 transmitted to all facilities in geographic area requested by pt/family on    10/30/17   FL2 transmitted to all facilities within larger geographic area on 10/30/17      Yes   Patient/family informed of bed offers received.  Patient chooses bed at     [facility]  Physician recommends and patient chooses bed at     [facility] Patient to be transferred to   on 11/04/17.  Patient to be transferred to facility by     [facility]  Patient family notified on 11/04/17 of transfer.  PHYSICIAN Please prepare prescriptions     Additional Comment:    _______________________________________________ Darreld Mclean, LCSWA 11/04/2017, 4:40 PM

## 2017-11-04 NOTE — Progress Notes (Signed)
PT Cancellation Note  Patient Details Name: [name] MRN: [id number] DOB: 14-Aug-1940   Cancelled Treatment:    Reason Eval/Treat Not Completed: Patient at procedure or test/unavailable   Rebeca Alert, PT Acute Rehabilitation Services Pager: [phone] Office: [phone]

## 2017-11-04 NOTE — Progress Notes (Signed)
CSW made aware patient's insurance authorization has been approved. [name] will be able to accept patient today. CSW to fax discharge summary when ready and coordinate discharge plans.  Enid Cutter, LCSW-A Clinical Social Worker [phone]

## 2017-11-04 NOTE — Progress Notes (Signed)
Discharge summary faxed to [facility], patient able to go today.  RN CALL FOR REPORT: [phone]  Patient going to room 113A  [facility] called.  Enid Cutter, LCSW-A Clinical Social Worker [phone]

## 2017-11-04 NOTE — Discharge Instructions (Signed)
Ureteral Stent Implantation, Care After Refer to this sheet in the next few weeks. These instructions provide you with information about caring for yourself after your procedure. Your health care provider may also give you more specific instructions. Your treatment has been planned according to current medical practices, but problems sometimes occur. Call your health care provider if you have any problems or questions after your procedure. What can I expect after the procedure? After the procedure, it is common to have:  Nausea.  Mild pain when you urinate. You may feel this pain in your lower back or lower abdomen. Pain should stop within a few minutes after you urinate. This may last for up to 1 week.  A small amount of blood in your urine for several days.  Follow these instructions at home:  Medicines  Take over-the-counter and prescription medicines only as told by your health care provider.  If you were prescribed an antibiotic medicine, take it as told by your health care provider. Do not stop taking the antibiotic even if you start to feel better.  Do not drive for 24 hours if you received a sedative.  Do not drive or operate heavy machinery while taking prescription pain medicines. Activity  Return to your normal activities as told by your health care provider. Ask your health care provider what activities are safe for you.  Do not lift anything that is heavier than 10 lb (4.5 kg). Follow this limit for 1 week after your procedure, or for as long as told by your health care provider. General instructions  Watch for any blood in your urine. Call your health care provider if the amount of blood in your urine increases.  If you have a catheter: ? Follow instructions from your health care provider about taking care of your catheter and collection bag. ? Do not take baths, swim, or use a hot tub until your health care provider approves.  Drink enough fluid to keep your urine  clear or pale yellow.  Keep all follow-up visits as told by your health care provider. This is important. Contact a health care provider if:  You have pain that gets worse or does not get better with medicine, especially pain when you urinate.  You have difficulty urinating.  You feel nauseous or you vomit repeatedly during a period of more than 2 days after the procedure. Get help right away if:  Your urine is dark red or has blood clots in it.  You are leaking urine (have incontinence).  The end of the stent comes out of your urethra.  You cannot urinate.  You have sudden, sharp, or severe pain in your abdomen or lower back.  You have a fever. This information is not intended to replace advice given to you by your health care provider. Make sure you discuss any questions you have with your health care provider.  You may remove your stent by pulling the string on Monday 11/08/17.  If you don't feel comfortable doing that, please call the office and we will get you in to remove it.  Document Released: 08/24/2012 Document Revised: 05/30/2015 Document Reviewed: 07/06/2014 Elsevier Interactive Patient Education  2018 [employer]. Bacteremia Bacteremia is the presence of bacteria in the blood. When bacteria enter the bloodstream, they can cause a life-threatening reaction called sepsis, which is a medical emergency. Bacteremia can spread to other parts of the body, including the heart, joints, and brain. What are the causes? This condition is caused by bacteria  that get into the blood. Bacteria can enter the blood:  From a skin infection or a cut on your skin.  During an episode of pneumonia.  From an infection in your stomach or intestine (gastrointestinal infection).  From an infection in your bladder or urinary system (urinary tract infection).  During a dental or medical procedure.  After you brush your teeth so hard that your gums bleed.  When a bacterial infection in  another part of the body spreads to the blood.  Through a dirty needle.  What increases the risk? This condition is more likely to develop in:  Children.  Elderly adults.  People who have a long-lasting (chronic) disease or medical condition.  People who have an artificial joint or heart valve.  People who have heart valve disease.  People who have a tube, such as a catheter or IV tube, that has been inserted for a medical treatment.  People who have a weak body defense system (immune system).  People who use IV drugs.  What are the signs or symptoms? Symptoms of this condition include:  Fever.  Chills.  A racing heart.  Shortness of breath.  Dizziness.  Weakness.  Confusion.  Nausea or vomiting.  Diarrhea.  In some cases, there are no symptoms. Bacteremia that has spread to the other parts of the body may cause symptoms in those areas. How is this diagnosed? This condition may be diagnosed with a physical exam and tests, such as:  A complete blood count (CBC). This test looks for signs of infection.  Blood cultures. These look for bacteria in your blood.  Tests of any tubes that you may have inserted into your body, such as an IV tube or urinary catheter. These tests look for a source of infection.  Urine tests including urine cultures. These look for bacteria in the urine that could be a source of infection.  Imaging tests, such as an X-ray, CT scan, MRI, or heart ultrasound. These look for a source of infection in other parts of the body, such as the lungs, heart valves, or joints.  How is this treated? This condition may be treated with:  Antibiotic medicines given through an IV infusion. Depending on the source of infection, antibiotics may be needed for several weeks. At first, an antibiotic may be given to kill most types of blood bacteria. If your test results show that a certain kind of bacteria is causing problems, the antibiotic may be changed  to kill only the bacteria that are causing problems.  Antibiotics taken by mouth.  IV fluids to support the body as you fight the infection.  Removing any catheter or device that could be a source of infection.  Blood pressure and breathing support, if you have sepsis.  Surgery to control the source or spread of infection, such as: ? Removing an infected implanted device. ? Removing infected tissue or an abscess.  This condition is usually treated at a hospital. If you are treated at home, you may need to come back for medicines, blood tests, and evaluation. This is important. Follow these instructions at home:  Take over-the-counter and prescription medicines only as told by your health care provider.  If you were prescribed an antibiotic, take it as told by your health care provider. Do not stop taking the antibiotic even if you start to feel better.  Rest until your condition is under control.  Drink enough fluid to keep your urine clear or pale yellow.  Do not  smoke. If you need help quitting, ask your health care provider.  Keep all follow-up visits as told by your health care provider. This is important. How is this prevented?  Get the vaccinations that your health care provider recommends.  Clean and cover any scrapes or cuts.  Take good care of your skin. This includes regular bathing and moisturizing.  Wash your hands often.  Practice good oral hygiene. Brush your teeth two times a day and floss regularly. Get help right away if:  You have pain.  You have a fever.  You have trouble breathing.  Your skin becomes blotchy, pale, or clammy.  You develop confusion, dizziness, or weakness.  You develop diarrhea.  You develop any new symptoms after treatment. Summary  Bacteremia is the presence of bacteria in the blood. When bacteria enter the bloodstream, they can cause a life- threatening reaction called sepsis.  Children and elderly adults are at  increased risk of bacteremia. Other risk factors include having a long-lasting (chronic) disease or a weak immune system, having an artificial joint or heart valve, having heart valve disease, having tubes that were inserted in the body for medical treatment, or using IV drugs.  Some symptoms of bacteremia include fever, chills, shortness of breath, confusion, nausea or vomiting, and diarrhea.  Tests may be done to diagnose a source of infection that led to bacteremia. These tests may include blood tests, urine tests, and imaging tests.  Bacteremia is usually treated with antibiotics, usually in a hospital. This information is not intended to replace advice given to you by your health care provider. Make sure you discuss any questions you have with your health care provider. Document Released: 10/05/2005 Document Revised: 11/19/2015 Document Reviewed: 11/19/2015 Elsevier Interactive Patient Education  [employer].

## 2017-11-04 NOTE — Progress Notes (Signed)
OT Cancellation Note  Patient Details Name: DWAYNE BULKLEY MRN: [id number] DOB: Feb 27, 1940   Cancelled Treatment:    Reason Eval/Treat Not Completed: Patient at procedure or test/ unavailable  Nevelyn Mellott 11/04/2017, 1:05 PM  Marica Otter, OTR/L Acute Rehabilitation Services [phone] [facility] pager [phone] office 11/04/2017

## 2017-11-04 NOTE — Progress Notes (Signed)
ANTICOAGULATION CONSULT NOTE   Pharmacy Consult for Warfarin Indication: atrial fibrillation, pulmonary embolus, DVT and stroke  Allergies  Allergen Reactions  . Prednisone     Other reaction(s): Other (See Comments) unknown  . Zithromax [Azithromycin] Other (See Comments)    Weak and flulike symptoms    Patient Measurements: Height: 5\' 4"  (162.6 cm) Weight: 165 lb 14.4 oz (75.3 kg) IBW/kg (Calculated) : 59.2  Vital Signs: Temp: 98.1 F (36.7 C) (10/31 1351) Temp Source: Oral (10/31 1351) BP: 151/80 (10/31 1351) Pulse Rate: 77 (10/31 1351)  Labs: Recent Labs    11/02/17 0556 11/03/17 0545 11/04/17 0543  HGB 10.9*  --  11.0*  HCT 33.5*  --  33.1*  PLT 257  --  252  LABPROT 32.1* 21.5* 19.1*  INR 3.18 1.90 1.63    Estimated Creatinine Clearance: 63.4 mL/min (by C-G formula based on SCr of 0.92 mg/dL).   Medications:  Scheduled:  . sodium chloride   Intravenous Once  . insulin aspart  0-15 Units Subcutaneous TID WC  . insulin aspart  0-5 Units Subcutaneous QHS  . insulin glargine  15 Units Subcutaneous BID  . simvastatin  40 mg Oral QPM  . sodium chloride flush  3 mL Intravenous Q12H  . Warfarin - Pharmacist Dosing Inpatient   Does not apply q1800   Infusions:  . ampicillin (OMNIPEN) IV 2 g (11/04/17 1256)    Assessment: [name] admitted 10/22 with urosepsis.  He is on warfarin PTA for PE/DVT 2002, Afib and stroke 2002. Pt has IVC filter.  Pharmacy consulted to dose warfarin.  PTA warfarin dose 3 mg daily except 2 mg on Mon/Fri.  Last dose PTA was 10/21. Admission INR 3.75  Today, 11/04/2017:  INR subtherapeutic and lower today after holding warfarin x8 days (although INR was supratherapeutic for all but one of those days)  Hgb low but stable; Plt stable wnl  Underwent ureteroscopy earlier today  No bleeding or complications noted  Eating 100% of meals  No major drug drug interactions. Antibiotics may prolong INR. INR can also be elevated in  acute illness.  Goal of Therapy:  INR 2-3 Monitor platelets by anticoagulation protocol: Yes   Plan:   Warfarin 1 mg PO x1 this evening. Will give a reduced dose since patient has had supratherapeutic INR despite warfarin being held for several days  Per MD, no bridge therapy at this time. Ok to resume warfarin this evening  Daily PT/INR  CBC tomorrow morning  Monitor for signs and symptoms of bleeding.  Bernadene Person, PharmD, BCPS [phone] 11/04/2017, 3:56 PM   .

## 2017-11-05 ENCOUNTER — Encounter (HOSPITAL_COMMUNITY): Payer: Self-pay | Admitting: Urology

## 2017-11-28 ENCOUNTER — Emergency Department (HOSPITAL_COMMUNITY): Payer: Medicare Other

## 2017-11-28 ENCOUNTER — Observation Stay (HOSPITAL_COMMUNITY): Payer: Medicare Other

## 2017-11-28 ENCOUNTER — Observation Stay (HOSPITAL_COMMUNITY)
Admission: EM | Admit: 2017-11-28 | Discharge: 2017-11-29 | Disposition: A | Discharge status: Home / Self Care | Payer: Medicare Other | Attending: Family Medicine | Admitting: Family Medicine

## 2017-11-28 ENCOUNTER — Encounter (HOSPITAL_COMMUNITY): Payer: Self-pay | Admitting: Family Medicine

## 2017-11-28 ENCOUNTER — Other Ambulatory Visit: Payer: Self-pay

## 2017-11-28 DIAGNOSIS — I10 Essential (primary) hypertension: Secondary | ICD-10-CM | POA: Diagnosis not present

## 2017-11-28 DIAGNOSIS — Z881 Allergy status to other antibiotic agents status: Secondary | ICD-10-CM | POA: Insufficient documentation

## 2017-11-28 DIAGNOSIS — K859 Acute pancreatitis without necrosis or infection, unspecified: Principal | ICD-10-CM | POA: Insufficient documentation

## 2017-11-28 DIAGNOSIS — N281 Cyst of kidney, acquired: Secondary | ICD-10-CM | POA: Insufficient documentation

## 2017-11-28 DIAGNOSIS — R110 Nausea: Secondary | ICD-10-CM | POA: Insufficient documentation

## 2017-11-28 DIAGNOSIS — I700 Atherosclerosis of aorta: Secondary | ICD-10-CM | POA: Diagnosis not present

## 2017-11-28 DIAGNOSIS — E785 Hyperlipidemia, unspecified: Secondary | ICD-10-CM | POA: Diagnosis not present

## 2017-11-28 DIAGNOSIS — K861 Other chronic pancreatitis: Secondary | ICD-10-CM | POA: Diagnosis present

## 2017-11-28 DIAGNOSIS — K7460 Unspecified cirrhosis of liver: Secondary | ICD-10-CM | POA: Diagnosis not present

## 2017-11-28 DIAGNOSIS — D696 Thrombocytopenia, unspecified: Secondary | ICD-10-CM | POA: Diagnosis not present

## 2017-11-28 DIAGNOSIS — Z836 Family history of other diseases of the respiratory system: Secondary | ICD-10-CM | POA: Diagnosis not present

## 2017-11-28 DIAGNOSIS — Z86711 Personal history of pulmonary embolism: Secondary | ICD-10-CM | POA: Insufficient documentation

## 2017-11-28 DIAGNOSIS — R1084 Generalized abdominal pain: Secondary | ICD-10-CM | POA: Diagnosis present

## 2017-11-28 DIAGNOSIS — I69312 Visuospatial deficit and spatial neglect following cerebral infarction: Secondary | ICD-10-CM | POA: Insufficient documentation

## 2017-11-28 DIAGNOSIS — N132 Hydronephrosis with renal and ureteral calculous obstruction: Secondary | ICD-10-CM | POA: Diagnosis not present

## 2017-11-28 DIAGNOSIS — H538 Other visual disturbances: Secondary | ICD-10-CM | POA: Diagnosis not present

## 2017-11-28 DIAGNOSIS — I4891 Unspecified atrial fibrillation: Secondary | ICD-10-CM | POA: Diagnosis present

## 2017-11-28 DIAGNOSIS — N400 Enlarged prostate without lower urinary tract symptoms: Secondary | ICD-10-CM | POA: Insufficient documentation

## 2017-11-28 DIAGNOSIS — K7469 Other cirrhosis of liver: Secondary | ICD-10-CM

## 2017-11-28 DIAGNOSIS — I8510 Secondary esophageal varices without bleeding: Secondary | ICD-10-CM | POA: Insufficient documentation

## 2017-11-28 DIAGNOSIS — G4700 Insomnia, unspecified: Secondary | ICD-10-CM | POA: Insufficient documentation

## 2017-11-28 DIAGNOSIS — R188 Other ascites: Secondary | ICD-10-CM | POA: Insufficient documentation

## 2017-11-28 DIAGNOSIS — I480 Paroxysmal atrial fibrillation: Secondary | ICD-10-CM | POA: Diagnosis not present

## 2017-11-28 DIAGNOSIS — Z794 Long term (current) use of insulin: Secondary | ICD-10-CM | POA: Diagnosis not present

## 2017-11-28 DIAGNOSIS — R0600 Dyspnea, unspecified: Secondary | ICD-10-CM | POA: Diagnosis not present

## 2017-11-28 DIAGNOSIS — Z7901 Long term (current) use of anticoagulants: Secondary | ICD-10-CM | POA: Diagnosis not present

## 2017-11-28 DIAGNOSIS — Z86718 Personal history of other venous thrombosis and embolism: Secondary | ICD-10-CM | POA: Diagnosis not present

## 2017-11-28 DIAGNOSIS — Z87442 Personal history of urinary calculi: Secondary | ICD-10-CM | POA: Diagnosis not present

## 2017-11-28 DIAGNOSIS — Z8673 Personal history of transient ischemic attack (TIA), and cerebral infarction without residual deficits: Secondary | ICD-10-CM

## 2017-11-28 DIAGNOSIS — Z79899 Other long term (current) drug therapy: Secondary | ICD-10-CM | POA: Insufficient documentation

## 2017-11-28 DIAGNOSIS — E119 Type 2 diabetes mellitus without complications: Secondary | ICD-10-CM | POA: Diagnosis not present

## 2017-11-28 DIAGNOSIS — R160 Hepatomegaly, not elsewhere classified: Secondary | ICD-10-CM | POA: Diagnosis not present

## 2017-11-28 DIAGNOSIS — Z888 Allergy status to other drugs, medicaments and biological substances status: Secondary | ICD-10-CM | POA: Insufficient documentation

## 2017-11-28 LAB — CBC WITH DIFFERENTIAL/PLATELET
Abs Immature Granulocytes: 0.04 10*3/uL (ref 0.00–0.07)
Basophils Absolute: 0 10*3/uL (ref 0.0–0.1)
Basophils Relative: 0 %
EOS PCT: 1 %
Eosinophils Absolute: 0.2 10*3/uL (ref 0.0–0.5)
HEMATOCRIT: 38.4 % — AB (ref 39.0–52.0)
Hemoglobin: 12.3 g/dL — ABNORMAL LOW (ref 13.0–17.0)
Immature Granulocytes: 0 %
LYMPHS ABS: 1.6 10*3/uL (ref 0.7–4.0)
LYMPHS PCT: 14 %
MCH: 30.5 pg (ref 26.0–34.0)
MCHC: 32 g/dL (ref 30.0–36.0)
MCV: 95.3 fL (ref 80.0–100.0)
MONO ABS: 0.7 10*3/uL (ref 0.1–1.0)
MONOS PCT: 7 %
Neutro Abs: 8.6 10*3/uL — ABNORMAL HIGH (ref 1.7–7.7)
Neutrophils Relative %: 78 %
Platelets: 196 10*3/uL (ref 150–400)
RBC: 4.03 MIL/uL — ABNORMAL LOW (ref 4.22–5.81)
RDW: 14.3 % (ref 11.5–15.5)
WBC: 11.2 10*3/uL — ABNORMAL HIGH (ref 4.0–10.5)
nRBC: 0 % (ref 0.0–0.2)

## 2017-11-28 LAB — GLUCOSE, CAPILLARY
GLUCOSE-CAPILLARY: 98 mg/dL (ref 70–99)
GLUCOSE-CAPILLARY: 106 mg/dL — AB (ref 70–99)
GLUCOSE-CAPILLARY: 93 mg/dL (ref 70–99)
GLUCOSE-CAPILLARY: 98 mg/dL (ref 70–99)

## 2017-11-28 LAB — URINALYSIS, ROUTINE W REFLEX MICROSCOPIC
BACTERIA UA: NONE SEEN
Bilirubin Urine: NEGATIVE
Glucose, UA: NEGATIVE mg/dL
KETONES UR: NEGATIVE mg/dL
Nitrite: NEGATIVE
PH: 6 (ref 5.0–8.0)
Protein, ur: NEGATIVE mg/dL
SPECIFIC GRAVITY, URINE: 1.009 (ref 1.005–1.030)

## 2017-11-28 LAB — COMPREHENSIVE METABOLIC PANEL
ALBUMIN: 3.8 g/dL (ref 3.5–5.0)
ALT: 27 U/L (ref 0–44)
AST: 41 U/L (ref 15–41)
Alkaline Phosphatase: 90 U/L (ref 38–126)
Anion gap: 11 (ref 5–15)
BILIRUBIN TOTAL: 1.1 mg/dL (ref 0.3–1.2)
BUN: 13 mg/dL (ref 8–23)
CALCIUM: 10.2 mg/dL (ref 8.9–10.3)
CO2: 22 mmol/L (ref 22–32)
Chloride: 104 mmol/L (ref 98–111)
Creatinine, Ser: 0.98 mg/dL (ref 0.61–1.24)
GFR calc Af Amer: >60 mL/min (ref >60)
GFR calc non Af Amer: >60 mL/min (ref >60)
GLUCOSE: 157 mg/dL — AB (ref 70–99)
Potassium: 4.4 mmol/L (ref 3.5–5.1)
Sodium: 137 mmol/L (ref 135–145)
TOTAL PROTEIN: 7.7 g/dL (ref 6.5–8.1)

## 2017-11-28 LAB — PROTIME-INR
INR: 3.87
Prothrombin Time: 37.4 seconds — ABNORMAL HIGH (ref 11.4–15.2)

## 2017-11-28 LAB — AMYLASE: Amylase: 119 U/L — ABNORMAL HIGH (ref 28–100)

## 2017-11-28 LAB — LIPASE, BLOOD: LIPASE: 46 U/L (ref 11–51)

## 2017-11-28 LAB — TRIGLYCERIDES: TRIGLYCERIDES: 108 mg/dL (ref <150)

## 2017-11-28 MED ORDER — SODIUM CHLORIDE 0.9 % IV BOLUS
1000.0000 mL | Freq: Once | INTRAVENOUS | Status: AC
  Administered 2017-11-28: 1000 mL via INTRAVENOUS

## 2017-11-28 MED ORDER — SODIUM CHLORIDE 0.9 % IV SOLN: INTRAVENOUS | Status: AC

## 2017-11-28 MED ORDER — HYDRALAZINE HCL 20 MG/ML IJ SOLN: 10.0000 mg | INTRAMUSCULAR | Status: DC | PRN

## 2017-11-28 MED ORDER — FAMOTIDINE IN NACL 20-0.9 MG/50ML-% IV SOLN
20.0000 mg | Freq: Once | INTRAVENOUS | Status: AC
  Administered 2017-11-28: 20 mg via INTRAVENOUS
  Filled 2017-11-28: qty 50

## 2017-11-28 MED ORDER — ACETAMINOPHEN 325 MG PO TABS: 650.0000 mg | ORAL_TABLET | Freq: Four times a day (QID) | ORAL | Status: DC | PRN

## 2017-11-28 MED ORDER — FENTANYL CITRATE (PF) 100 MCG/2ML IJ SOLN
50.0000 ug | Freq: Once | INTRAMUSCULAR | Status: AC
  Administered 2017-11-28: 50 ug via INTRAVENOUS
  Filled 2017-11-28: qty 2

## 2017-11-28 MED ORDER — IOPAMIDOL (ISOVUE-300) INJECTION 61%
INTRAVENOUS | Status: AC
  Filled 2017-11-28: qty 100

## 2017-11-28 MED ORDER — ONDANSETRON HCL 4 MG PO TABS: 4.0000 mg | ORAL_TABLET | Freq: Four times a day (QID) | ORAL | Status: DC | PRN

## 2017-11-28 MED ORDER — FENTANYL CITRATE (PF) 100 MCG/2ML IJ SOLN: 25.0000 ug | INTRAMUSCULAR | Status: DC | PRN

## 2017-11-28 MED ORDER — ONDANSETRON HCL 4 MG/2ML IJ SOLN: 4.0000 mg | Freq: Four times a day (QID) | INTRAMUSCULAR | Status: DC | PRN

## 2017-11-28 MED ORDER — PANTOPRAZOLE SODIUM 40 MG IV SOLR
40.0000 mg | INTRAVENOUS | Status: DC
  Administered 2017-11-28 – 2017-11-29 (×2): 40 mg via INTRAVENOUS
  Filled 2017-11-28 (×2): qty 40

## 2017-11-28 MED ORDER — SODIUM CHLORIDE (PF) 0.9 % IJ SOLN
INTRAMUSCULAR | Status: AC
  Filled 2017-11-28: qty 50

## 2017-11-28 MED ORDER — IOPAMIDOL (ISOVUE-300) INJECTION 61%
100.0000 mL | Freq: Once | INTRAVENOUS | Status: AC | PRN
  Administered 2017-11-28: 100 mL via INTRAVENOUS

## 2017-11-28 MED ORDER — ONDANSETRON HCL 4 MG/2ML IJ SOLN
4.0000 mg | Freq: Once | INTRAMUSCULAR | Status: AC
  Administered 2017-11-28: 4 mg via INTRAVENOUS
  Filled 2017-11-28: qty 2

## 2017-11-28 MED ORDER — ACETAMINOPHEN 650 MG RE SUPP: 650.0000 mg | Freq: Four times a day (QID) | RECTAL | Status: DC | PRN

## 2017-11-28 MED ORDER — MORPHINE SULFATE (PF) 4 MG/ML IV SOLN
4.0000 mg | Freq: Once | INTRAVENOUS | Status: AC
  Administered 2017-11-28: 4 mg via INTRAVENOUS
  Filled 2017-11-28: qty 1

## 2017-11-28 MED ORDER — INSULIN ASPART 100 UNIT/ML ~~LOC~~ SOLN
0.0000 [IU] | SUBCUTANEOUS | Status: DC
  Administered 2017-11-29: 1 [IU] via SUBCUTANEOUS

## 2017-11-28 NOTE — ED Notes (Signed)
ED TO INPATIENT HANDOFF REPORT  Name/Age/Gender [name] 77 y.o. male  Code Status    Code Status Orders  (From admission, onward)         Start     Ordered   11/28/17 0410  Full code  Continuous     11/28/17 0412        Code Status History    Date Active Date Inactive Code Status Order ID Comments User Context   10/26/2017 1732 11/04/2017 2203 Full Code [id number]  Eugenie Filler, MD Inpatient   10/15/2017 1228 10/16/2017 1328 Full Code [id number]  [name], MD Inpatient   05/28/2016 1757 06/01/2016 1938 DNR [id number]  [name], MD Inpatient   05/28/2016 1502 05/28/2016 1757 Full Code [id number]  [name], MD ED      Home/SNF/Other Home  Chief Complaint abdominal pain  Level of Care/Admitting Diagnosis ED Disposition    ED Disposition Condition [facility] Area: [facility] [[id number]]  Level of Care: Med-Surg [16]  Diagnosis: Acute pancreatitis [577.0.ICD-9-CM]  Admitting Physician: Vianne Bulls [[id number]]  Attending Physician: Vianne Bulls [[id number]]  PT Class (Do Not Modify): Observation [104]  PT Acc Code (Do Not Modify): Observation [10022]       Medical History Past Medical History:  Diagnosis Date  . Cirrhosis ([place])    noted on CT 05/28/16  . Closed compression fracture of L1 lumbar vertebra 05/2016  . Diabetes mellitus   . Diverticulitis   . DVT (deep venous thrombosis) ([place]) 2002  . Dyspnea    with exertion  . History of kidney stones   . Hyperlipidemia   . Hypertension   . Insomnia   . Kidney stones    hx of  . Liver mass, right lobe    noted on CT 05/28/16  . Pancreatic cyst    noted on CT 05/28/16  . Perforated diverticulum   . Presence of inferior vena cava filter 2002  . Pulmonary embolism ([place]) 2002  . Secondary hypercoagulability disorder ([place])   . Status post placement of ureteral stent:R 10/15/2017 10/26/2017  . Stroke [facility]) 2002   right eye no peripheral vision,  short term memory loss    Allergies Allergies  Allergen Reactions  . Prednisone     Other reaction(s): Other (See Comments) unknown  . Zithromax [Azithromycin] Other (See Comments)    Weak and flulike symptoms    IV Location/Drains/Wounds Patient Lines/Drains/Airways Status   Active Line/Drains/Airways    Name:   Placement date:   Placement time:   Site:   Days:   Peripheral IV 11/28/17 Right Antecubital   11/28/17    0142    Antecubital   less than 1   Ureteral Drain/Stent Right ureter 6 Fr.   11/04/17    1212    Right ureter   24   Incision (Closed) 01/26/17 Penis   01/26/17    0949     306          Labs/Imaging Results for orders placed or performed during the hospital encounter of 11/28/17 (from the past 48 hour(s))  CBC with Differential     Status: Abnormal   Collection Time: 11/28/17  1:20 AM  Result Value Ref Range   WBC 11.2 (H) 4.0 - 10.5 K/uL   RBC 4.03 (L) 4.22 - 5.81 MIL/uL   Hemoglobin 12.3 (L) 13.0 - 17.0 g/dL   HCT 38.4 (L) 39.0 - 52.0 %   MCV  95.3 80.0 - 100.0 fL   MCH 30.5 26.0 - 34.0 pg   MCHC 32.0 30.0 - 36.0 g/dL   RDW 14.3 11.5 - 15.5 %   Platelets 196 150 - 400 K/uL   nRBC 0.0 0.0 - 0.2 %   Neutrophils Relative % 78 %   Neutro Abs 8.6 (H) 1.7 - 7.7 K/uL   Lymphocytes Relative 14 %   Lymphs Abs 1.6 0.7 - 4.0 K/uL   Monocytes Relative 7 %   Monocytes Absolute 0.7 0.1 - 1.0 K/uL   Eosinophils Relative 1 %   Eosinophils Absolute 0.2 0.0 - 0.5 K/uL   Basophils Relative 0 %   Basophils Absolute 0.0 0.0 - 0.1 K/uL   Immature Granulocytes 0 %   Abs Immature Granulocytes 0.04 0.00 - 0.07 K/uL    Comment: Performed at [facility], [place] [address] [place], [place] [address]  Comprehensive metabolic panel     Status: Abnormal   Collection Time: 11/28/17  1:20 AM  Result Value Ref Range   Sodium 137 135 - 145 mmol/L   Potassium 4.4 3.5 - 5.1 mmol/L   Chloride 104 98 - 111 mmol/L   CO2 22 22 - 32 mmol/L   Glucose, Bld 157 (H) 70  - 99 mg/dL   BUN 13 8 - 23 mg/dL   Creatinine, Ser 0.98 0.61 - 1.24 mg/dL   Calcium 10.2 8.9 - 10.3 mg/dL   Total Protein 7.7 6.5 - 8.1 g/dL   Albumin 3.8 3.5 - 5.0 g/dL   AST 41 15 - 41 U/L   ALT 27 0 - 44 U/L   Alkaline Phosphatase 90 38 - 126 U/L   Total Bilirubin 1.1 0.3 - 1.2 mg/dL   GFR calc non Af Amer >60 >60 mL/min   GFR calc Af Amer >60 >60 mL/min    Comment: (NOTE) The eGFR has been calculated using the CKD EPI equation. This calculation has not been validated in all clinical situations. eGFR's persistently <60 mL/min signify possible Chronic Kidney Disease.    Anion gap 11 5 - 15    Comment: Performed at [facility], [place] [address], [place], [place] [address]  Lipase, blood     Status: None   Collection Time: 11/28/17  1:20 AM  Result Value Ref Range   Lipase 46 11 - 51 U/L    Comment: Performed at [facility], [place] [address], [place], [place] [address]  Urinalysis, Routine w reflex microscopic     Status: Abnormal   Collection Time: 11/28/17  2:24 AM  Result Value Ref Range   Color, Urine STRAW (A) YELLOW   APPearance CLEAR CLEAR   Specific Gravity, Urine 1.009 1.005 - 1.030   pH 6.0 5.0 - 8.0   Glucose, UA NEGATIVE NEGATIVE mg/dL   Hgb urine dipstick SMALL (A) NEGATIVE   Bilirubin Urine NEGATIVE NEGATIVE   Ketones, ur NEGATIVE NEGATIVE mg/dL   Protein, ur NEGATIVE NEGATIVE mg/dL   Nitrite NEGATIVE NEGATIVE   Leukocytes, UA TRACE (A) NEGATIVE   RBC / HPF 11-20 0 - 5 RBC/hpf   WBC, UA 0-5 0 - 5 WBC/hpf   Bacteria, UA NONE SEEN NONE SEEN   Squamous Epithelial / LPF 0-5 0 - 5   Mucus PRESENT     Comment: Performed at [facility], [place] [address] [place], [place] [address]   Ct Abdomen Pelvis W Contrast  Result Date: 11/28/2017 CLINICAL DATA:  Abdominal pain and nausea.  EXAM: CT ABDOMEN AND PELVIS WITH CONTRAST TECHNIQUE: Multidetector CT imaging of the abdomen and pelvis was performed using the  standard protocol following bolus administration of intravenous contrast. CONTRAST:  131m ISOVUE-300 IOPAMIDOL (ISOVUE-300) INJECTION 61% COMPARISON:  None. FINDINGS: Lower chest: Lung bases are clear. Esophageal varices. Coronary artery calcifications. Hepatobiliary: Hepatic cirrhosis with enlargement of the lateral segment left and caudate lobes as well as nodular contour. Focal wedge-shaped peripheral low-attenuation lesion in the right lobe of the liver is unchanged since prior studies dating back to 07/12/2009. Long-term stability suggests benign etiology. Gallbladder and bile ducts are unremarkable. Pancreas: Fatty infiltration of the pancreas. Cystic lesion in the tail of the pancreas measuring 1.8 cm diameter. No change since MRI abdomen 05/29/2016. No pancreatic ductal dilatation. Calcifications in the head of the pancreas consistent with chronic pancreatitis. Infiltration in the fat around the head and body of the pancreas extending around the celiac axis and around the duodenum to the right anterior pararenal space. There is associated duodenal wall thickening. This could represent acute pancreatitis associated with duodenitis or peptic ulcer disease. Scattered peripancreatic lymph nodes are probably reactive. Spleen: Spleen is not significantly enlarged.  No focal lesions. Adrenals/Urinary Tract: No adrenal gland nodules. Stones in both kidneys, largest in the left lower pole measures 13 mm diameter. Cyst in the lower pole left kidney is unchanged. Low-attenuation lesion with calcification in the posterior inferior left kidney is unchanged since prior study and looks postoperative. Could also represent dystrophic calcification. No hydronephrosis or hydroureter. Cyst in the lower pole left kidney. Bladder is unremarkable. Stomach/Bowel: Wall thickening and inflammatory infiltration around the second portion of the duodenum. Stomach, small bowel, and colon are not abnormally distended. Scattered stool  throughout the colon. Appendix is not identified. Vascular/Lymphatic: Scattered calcifications in the abdominal aorta. No aortic aneurysm. No significant lymphadenopathy. Inferior vena caval filter. Reproductive: Prostate gland is diffusely enlarged, measuring 5.3 cm in diameter. Prostate calcifications are present. Other: Postoperative changes consistent with ventral abdominal wall hernia repair. No free air or free fluid in the abdomen. Musculoskeletal: Degenerative changes in the spine. Old compression of L1. Schmorl's nodes at T11 and T12. No change since prior study. Geographic sclerosis in the superior aspect of the right femoral head may represent avascular necrosis. IMPRESSION: 1. Inflammatory infiltration around the head and body of the pancreas and of the second portion the duodenum with duodenal wall thickening. Changes likely represent acute pancreatitis associated with duodenitis or peptic ulcer disease. 2. Calcifications in the pancreas consistent with chronic pancreatitis. 3. 1.8 cm diameter cystic lesion in the tail of the pancreas is unchanged since prior study. 4. Hepatic cirrhosis with evidence of portal venous hypertension including upper abdominal and esophageal varices. Focal lesion in segment 7 of the liver is unchanged since prior study of 2011 suggesting benign etiology. 5. Aortic atherosclerosis. 6. Bilateral nonobstructing renal stones. 7. Enlarged prostate gland. Electronically Signed   By: WLucienne CapersM.D.   On: 11/28/2017 02:52   None  Pending Labs Unresulted Labs (From admission, onward)    Start     Ordered   11/29/17 0500  Comprehensive metabolic panel  Tomorrow morning,   R     11/28/17 0412   11/29/17 0500  CBC WITH DIFFERENTIAL  Tomorrow morning,   R     11/28/17 0412   11/29/17 0500  Lipase, blood  Tomorrow morning,   R     11/28/17 0412   11/28/17 0412  Triglycerides  Once,   R  11/28/17 0412   11/28/17 0411  Amylase  Once,   R     11/28/17 0412    11/28/17 0352  Protime-INR  ONCE - STAT,   R     11/28/17 0351          Vitals/Pain Today's Vitals   11/28/17 0009 11/28/17 0013 11/28/17 0313  BP: 130/82  (!) 146/80  Pulse: 95  88  Resp: 18  16  Temp: 98.3 F (36.8 C)  98 F (36.7 C)  TempSrc: Oral  Oral  SpO2: 95%  95%  Weight:  69.9 kg   Height:  5' 4"  (1.626 m)   PainSc: 8       Isolation Precautions No active isolations  Medications Medications  iopamidol (ISOVUE-300) 61 % injection (has no administration in time range)  sodium chloride (PF) 0.9 % injection (has no administration in time range)  insulin aspart (novoLOG) injection 0-9 Units (has no administration in time range)  0.9 %  sodium chloride infusion (has no administration in time range)  acetaminophen (TYLENOL) tablet 650 mg (has no administration in time range)    Or  acetaminophen (TYLENOL) suppository 650 mg (has no administration in time range)  ondansetron (ZOFRAN) tablet 4 mg (has no administration in time range)    Or  ondansetron (ZOFRAN) injection 4 mg (has no administration in time range)  fentaNYL (SUBLIMAZE) injection 25-50 mcg (has no administration in time range)  sodium chloride 0.9 % bolus 1,000 mL (has no administration in time range)  hydrALAZINE (APRESOLINE) injection 10 mg (has no administration in time range)  pantoprazole (PROTONIX) injection 40 mg (has no administration in time range)  morphine 4 MG/ML injection 4 mg (4 mg Intravenous Given 11/28/17 0134)  ondansetron (ZOFRAN) injection 4 mg (4 mg Intravenous Given 11/28/17 0135)  iopamidol (ISOVUE-300) 61 % injection 100 mL (100 mLs Intravenous Contrast Given 11/28/17 0230)  fentaNYL (SUBLIMAZE) injection 50 mcg (50 mcg Intravenous Given 11/28/17 0328)  famotidine (PEPCID) IVPB 20 mg premix (0 mg Intravenous Stopped 11/28/17 0401)    Mobility walks

## 2017-11-28 NOTE — Progress Notes (Signed)
PT Cancellation Note  Patient Details Name: Dustin GilfordJackie L Vasques MRN: [id number] DOB: July 19, 1940   Cancelled Treatment:    Reason Eval/Treat Not Completed: PT screened, no needs identified, will sign off. Pt reports he is walking in his room without difficulty (when staff unplugs IV for him) and doesn't feel he needs PT.  He does report being very hungry and wants to eat. Spoke with nursing who confirmed pt has been getting up and is steady on his feet. Will d/c from PT at this time.  Please re-order if pt's status changes.   Enzo MontgomeryKaren L Carlyn Lemke 11/28/2017, 3:01 PM

## 2017-11-28 NOTE — Progress Notes (Signed)
**Note De-Identified vi Obfusction** PROGRESS NOTE    [name]  MVH:[id number] DOB: Sep 15, 1940 DOA: 11/28/2017 PCP: Disy Floro, MD   Brief Nrrtive:  [name] is  78 y.o. mle with medicl history significnt for liver cirrhosis, type 2 dibetes mellitus, tril fibrilltion on wrfrin, history of DVT nd PE, nd history of CVA, now presenting to the emergency deprtment for evlution of severe bdominl pin.  Ptient reports some intermittent bdominl pin over the pst week tht hs become more persistent nd severe over the pst couple dys.  Pin is minly in the mid nd right bdomen, ssocited with nuse but no vomiting, nd without ny pprecible lleviting or excerbting fctors identified.  He denies ny fevers, chills, or dirrhe.  Denies indigestion, melen, or hemtochezi.  He hd hd  ureterl stent plced lst month tht hs since been removed.  He denies ny dysuri or hemturi recently.  Denies ny significnt lcohol buse, denies history of heptitis.  Does not believe tht he hs hd pncretitis before.  Assessment & Pln:   Principl Problem:   Acute pncretitis Active Problems:   Dibetes mellitus type II, non insulin dependent (HCC)   Cirrhosis of liver (HCC)   AF (tril fibrilltion) (HCC)   History of CVA (cerebrovsculr ccident)  1. Acute bdominl pin  - Presents with severe bdominl pin nd nuse without fever, vomiting, or dirrhe  - CT revels inflmmtion bout the pncres nd 2nd portion of duodenum with duodenl wll-thickening  - oddly pin is lower bdomen nd not epigstric in loction - Could be cute pncretitis or PUD  - he lso hd findings consistent with chronic pncretitis  - RUQ Kore negtive for gllstones or biliry dilttion - Denies ny significnt EtOH use, GB nd bile ducts pper norml on CT  - Bowel-rest, check triglyceride level, strt IVF hydrtion nd PPI, continue pin-control nd ntiemetics      2. Proxysml tril  fibrilltion  - CHADS-VASc is t lest 53 (ge x2, CVA x2, DM); he lso hs hx of LLE DVT nd PE  - Check INR, use heprin if needed while NPO  3. Type II DM  - A1c ws 8.4% in October '19  - Mnged t home with glipizide nd metformin, held on dmission - Check CBG's nd use  low-intensity SSI with Novolog for now   4. Liver cirrhosis  - Uncertin etiology, denies hx of significnt EtOH use or hx of virl heptitis  - Appers compensted   - Esophgel vrices noted on CT but no scites  - Strted on PPI s bove  - needs GI follow up outptient  5. History of CVA  - Sttin held while NPO  - He tkes wrfrin for stroke-prevention in  fib; INR pending with pln for heprin while NPO if INR subtherpeutic   # Indeterminte subcpsulr R heptic lobe 2.3 cm hyperechoic re by Kore: recommending MRI nnully   DVT prophylxis: wrfrin, INR suprtherpeutic  Code Sttus: full  Fmily Communiction: none t bedside Disposition Pln: pending further improvement   Consultnts:   none  Procedures:   none  Antimicrobils:  Anti-infectives (From dmission, onwrd)   None     Subjective: Feeling  bit better. Pin is still present. Lower bdomen.   Objective: Vitls:   11/28/17 0013 11/28/17 0313 11/28/17 0457 11/28/17 1422  BP:  (!) 146/80 (!) 142/84 127/75  Pulse:  88 78 75  Resp:  16 16 18   Temp:  98 F (36.7 C) 97.9 F (36.6 C) 97.6 F (  36.4 C)  TempSrc:  Oral Oral Oral  SpO2:  95% 98% 99%  Weight: 69.9 kg     Height: 5\' 4"  (1.626 m)       Intake/Output Summary (Last 24 hours) at 11/28/2017 1607 Last data filed at 11/28/2017 1400 Gross per 24 hour  Intake 1185.36 ml  Output 200 ml  Net 985.36 ml   Filed Weights   11/28/17 0013  Weight: 69.9 kg    Examination:  General exam: Appears calm and comfortable  Respiratory system: Clear to auscultation. Respiratory effort normal. Cardiovascular system: S1 & S2 heard, RRR. Gastrointestinal system:  mildly ttp, mostly in lower quadrants Central nervous system: Alert and oriented. No focal neurological deficits. Extremities:.moving all extremities Skin: No rashes, lesions or ulcers Psychiatry: Judgement and insight appear normal. Mood & affect appropriate.     Data Reviewed: I have personally reviewed following labs and imaging studies  CBC: Recent Labs  Lab 11/28/17 0120  WBC 11.2*  NEUTROABS 8.6*  HGB 12.3*  HCT 38.4*  MCV 95.3  PLT 196   Basic Metabolic Panel: Recent Labs  Lab 11/28/17 0120  NA 137  K 4.4  CL 104  CO2 22  GLUCOSE 157*  BUN 13  CREATININE 0.98  CALCIUM 10.2   GFR: Estimated Creatinine Clearance: 53.7 mL/min (by C-G formula based on SCr of 0.98 mg/dL). Liver Function Tests: Recent Labs  Lab 11/28/17 0120  AST 41  ALT 27  ALKPHOS 90  BILITOT 1.1  PROT 7.7  ALBUMIN 3.8   Recent Labs  Lab 11/28/17 0120 11/28/17 0537  LIPASE 46  --   AMYLASE  --  119*   No results for input(s): AMMONIA in the last 168 hours. Coagulation Profile: Recent Labs  Lab 11/28/17 0537  INR 3.87   Cardiac Enzymes: No results for input(s): CKTOTAL, CKMB, CKMBINDEX, TROPONINI in the last 168 hours. BNP (last 3 results) No results for input(s): PROBNP in the last 8760 hours. HbA1C: No results for input(s): HGBA1C in the last 72 hours. CBG: Recent Labs  Lab 11/28/17 0730 11/28/17 1239  GLUCAP 98 106*   Lipid Profile: Recent Labs    11/28/17 0537  TRIG 108   Thyroid Function Tests: No results for input(s): TSH, T4TOTAL, FREET4, T3FREE, THYROIDAB in the last 72 hours. Anemia Panel: No results for input(s): VITAMINB12, FOLATE, FERRITIN, TIBC, IRON, RETICCTPCT in the last 72 hours. Sepsis Labs: No results for input(s): PROCALCITON, LATICACIDVEN in the last 168 hours.  No results found for this or any previous visit (from the past 240 hour(s)).       Radiology Studies: Ct Abdomen Pelvis W Contrast  Result Date: 11/28/2017 CLINICAL DATA:   Abdominal pain and nausea. EXAM: CT ABDOMEN AND PELVIS WITH CONTRAST TECHNIQUE: Multidetector CT imaging of the abdomen and pelvis was performed using the standard protocol following bolus administration of intravenous contrast. CONTRAST:  ISOVUE-300 IOPAMIDOL (ISOVUE-300) INJECTION 61% COMPARISON:  None. FINDINGS: Lower chest: Lung bases are clear. Esophageal varices. Coronary artery calcifications. Hepatobiliary: Hepatic cirrhosis with enlargement of the lateral segment left and caudate lobes as well as nodular contour. Focal wedge-shaped peripheral low-attenuation lesion in the right lobe of the liver is unchanged since prior studies dating back to 07/12/2009. Long-term stability suggests benign etiology. Gallbladder and bile ducts are unremarkable. Pancreas: Fatty infiltration of the pancreas. Cystic lesion in the tail of the pancreas measuring 1.8 cm diameter. No change since MRI abdomen 05/29/2016. No pancreatic ductal dilatation. Calcifications in the head of the pancreas consistent  with chronic pancreatitis. Infiltration in the fat around the head and body of the pancreas extending around the celiac axis and around the duodenum to the right anterior pararenal space. There is associated duodenal wall thickening. This could represent acute pancreatitis associated with duodenitis or peptic ulcer disease. Scattered peripancreatic lymph nodes are probably reactive. Spleen: Spleen is not significantly enlarged.  No focal lesions. Adrenals/Urinary Tract: No adrenal gland nodules. Stones in both kidneys, largest in the left lower pole measures 13 mm diameter. Cyst in the lower pole left kidney is unchanged. Low-attenuation lesion with calcification in the posterior inferior left kidney is unchanged since prior study and looks postoperative. Could also represent dystrophic calcification. No hydronephrosis or hydroureter. Cyst in the lower pole left kidney. Bladder is unremarkable. Stomach/Bowel: Wall thickening  and inflammatory infiltration around the second portion of the duodenum. Stomach, small bowel, and colon are not abnormally distended. Scattered stool throughout the colon. Appendix is not identified. Vascular/Lymphatic: Scattered calcifications in the abdominal aorta. No aortic aneurysm. No significant lymphadenopathy. Inferior vena caval filter. Reproductive: Prostate gland is diffusely enlarged, measuring 5.3 cm in diameter. Prostate calcifications are present. Other: Postoperative changes consistent with ventral abdominal wall hernia repair. No free air or free fluid in the abdomen. Musculoskeletal: Degenerative changes in the spine. Old compression of L1. Schmorl's nodes at T11 and T12. No change since prior study. Geographic sclerosis in the superior aspect of the right femoral head may represent avascular necrosis. IMPRESSION: 1. Inflammatory infiltration around the head and body of the pancreas and of the second portion the duodenum with duodenal wall thickening. Changes likely represent acute pancreatitis associated with duodenitis or peptic ulcer disease. 2. Calcifications in the pancreas consistent with chronic pancreatitis. 3. 1.8 cm diameter cystic lesion in the tail of the pancreas is unchanged since prior study. 4. Hepatic cirrhosis with evidence of portal venous hypertension including upper abdominal and esophageal varices. Focal lesion in segment 7 of the liver is unchanged since prior study of 2011 suggesting benign etiology. 5. Aortic atherosclerosis. 6. Bilateral nonobstructing renal stones. 7. Enlarged prostate gland. Electronically Signed   By: Burman Nieves M.D.   On: 11/28/2017 02:52   US Abdomen Limited Ruq  Result Date: 11/28/2017 CLINICAL DATA:  Acute pancreatitis EXAM: ULTRASOUND ABDOMEN LIMITED RIGHT UPPER QUADRANT COMPARISON:  11/28/2017 CT with contrast FINDINGS: Gallbladder: Negative for gallstones. Gallbladder nondistended likely accounting for slight wall prominence. Wall  thickness measures 2.4 mm. No gallstones visualized. No pericholecystic fluid. Common bile duct: Diameter: 1.3 mm Liver: Mild heterogeneous echotexture. Hepatic cirrhosis noted. No biliary dilatation. Nonspecific subcapsular hyperechoic area in the right lobe measures 2.3 x 1.2 x 1.6 cm. This remains indeterminate by ultrasound. Recommend attention on his routine annual MRI follow-up. Portal vein is patent on color Doppler imaging with normal direction of blood flow towards the liver. No ascites IMPRESSION: Negative for gallstones or biliary dilatation Hepatic cirrhosis Indeterminate subcapsular right hepatic lobe 2.3 cm hyperechoic area by ultrasound. Recommend attention on continued surveillance MRI annually. Electronically Signed   By: Judie Petit.  Shick M.D.   On: 11/28/2017 10:32        Scheduled Meds: . insulin aspart  0-9 Units Subcutaneous Q4H  . pantoprazole  40 mg Intravenous Q24H   Continuous Infusions: . sodium chloride 110 mL/hr at 11/28/17 0725     LOS: 0 days    Time spent: over 30 min    Lacretia Nicks, MD [facility] Pager [phone]   If 7PM-7AM, please contact night-coverage [email or web address] Password [facility] [place] 11/28/2017,  4:07 PM

## 2017-11-28 NOTE — H&P (Signed)
History and Physical    [name] WJX:[id number] DOB: Nov 21, 1940 DOA: 11/28/2017  PCP: [name], MD   Patient coming from: Home   Chief Complaint: Abdominal pain, nausea   HPI: [name] is a 77 y.o. male with medical history significant for liver cirrhosis, type 2 diabetes mellitus, atrial fibrillation on warfarin, history of DVT and PE, and history of CVA, now presenting to the emergency department for evaluation of severe abdominal pain.  Patient reports some intermittent abdominal pain over the past week that has become more persistent and severe over the past couple days.  Pain is mainly in the mid and right abdomen, associated with nausea but no vomiting, and without any appreciable alleviating or exacerbating factors identified.  He denies any fevers, chills, or diarrhea.  Denies indigestion, melena, or hematochezia.  He had had a ureteral stent placed last month that has since been removed.  He denies any dysuria or hematuria recently.  Denies any significant alcohol abuse, denies history of hepatitis.  Does not believe that he has had pancreatitis before.  ED Course: Upon arrival to the ED, patient is found to be afebrile, saturating well on room air, and with vitals otherwise normal.  Chemistry panel is unremarkable and CBC features a leukocytosis to 11,200 and a slight normocytic anemia.  Urinalysis is unremarkable.  CT the abdomen and pelvis reveals inflammatory infiltration about the head of the pancreas and second portion of the duodenum with duodenal wall thickening which could be secondary to acute pancreatitis or peptic ulcer disease.  Patient was treated with morphine, fentanyl, Zofran, and Pepcid in the ED.  He reports some improvement, but is beginning to have severe pain again and will be observed for further evaluation and management.  Review of Systems:  All other systems reviewed and apart from HPI, are negative.  Past Medical History:  Diagnosis Date  .  Cirrhosis (HCC)    noted on CT 05/28/16  . Closed compression fracture of L1 lumbar vertebra 05/2016  . Diabetes mellitus   . Diverticulitis   . DVT (deep venous thrombosis) (HCC) 2002  . Dyspnea    with exertion  . History of kidney stones   . Hyperlipidemia   . Hypertension   . Insomnia   . Kidney stones    hx of  . Liver mass, right lobe    noted on CT 05/28/16  . Pancreatic cyst    noted on CT 05/28/16  . Perforated diverticulum   . Presence of inferior vena cava filter 2002  . Pulmonary embolism (HCC) 2002  . Secondary hypercoagulability disorder (HCC)   . Status post placement of ureteral stent:R 10/15/2017 10/26/2017  . Stroke [facility]) 2002   right eye no peripheral vision, short term memory loss    Past Surgical History:  Procedure Laterality Date  . APPENDECTOMY  1949  . BACK SURGERY    . CARPAL TUNNEL RELEASE  2002 both wrists  . COLON SURGERY  07/19/2009   sigmoid colectomy   . CYSTOSCOPY WITH BIOPSY N/A 01/26/2017   Procedure: CYSTOSCOPY;  Surgeon: Bjorn Pippin, MD;  Location: [facility];  Service: Urology;  Laterality: N/A;  . CYSTOSCOPY WITH STENT PLACEMENT Right 10/15/2017   Procedure: CYSTOSCOPY WITH STENT PLACEMENT;  Surgeon: Heloise Purpura, MD;  Location: [facility];  Service: Urology;  Laterality: Right;  . CYSTOSCOPY/URETEROSCOPY/HOLMIUM LASER/STENT PLACEMENT Right 11/04/2017   Procedure: CYSTOSCOPY/RIGHT URETEROSCOPY/HOLMIUM LASER/STENT EXCHANGE;  Surgeon: Bjorn Pippin, MD;  Location: [facility];  Service: Urology;  Laterality: Right;  . HERNIA REPAIR   1983, 1983, 02/03/2010   lap ventral hernia repair  . KIDNEY STONE SURGERY  1977, 1983  . LITHOTRIPSY  2010   bleeding in kidney after  . surgery for diverticulosis  2011  . TEE WITHOUT CARDIOVERSION N/A 11/03/2017   Procedure: TRANSESOPHAGEAL ECHOCARDIOGRAM (TEE);  Surgeon: Jodelle Red, MD;  Location: [facility]COPY;  Service: Cardiovascular;  Laterality: N/A;  . TONSILLECTOMY  1947  .  TONSILLECTOMY AND ADENOIDECTOMY    . VENTRAL HERNIA REPAIR  09/11/2011   Procedure: LAPAROSCOPIC VENTRAL HERNIA;  Surgeon: Kandis Cocking, MD;  Location: [facility];  Service: General;  Laterality: N/A;  Lapaaroscopic Repair Ventral Incisional Hernias x 2     reports that he has never smoked. He has never used smokeless tobacco. He reports that he does not drink alcohol or use drugs.  Allergies  Allergen Reactions  . Prednisone     Other reaction(s): Other (See Comments) unknown  . Zithromax [Azithromycin] Other (See Comments)    Weak and flulike symptoms    Family History  Problem Relation Age of Onset  . Lung disease Father   . Cancer Neg Hx      Prior to Admission medications   Medication Sig Start Date End Date Taking? Authorizing Provider  glipiZIDE (GLUCOTROL XL) 2.5 MG 24 hr tablet Take 2.5 mg by mouth daily after breakfast.  10/18/17  Yes [provider]  losartan (COZAAR) 100 MG tablet Take 100 mg by mouth every morning.    Yes [provider]  metFORMIN (GLUCOPHAGE-XR) 500 MG 24 hr tablet Take 1,000 mg by mouth 2 (two) times daily. 10/16/17  Yes [provider]  ondansetron (ZOFRAN) 4 MG tablet Take 1 tablet (4 mg total) by mouth every 8 (eight) hours as needed for nausea or vomiting. 10/15/17  Yes Heloise Purpura, MD  simvastatin (ZOCOR) 40 MG tablet Take 40 mg by mouth every evening.    Yes [provider]  warfarin (COUMADIN) 2 MG tablet Take 2-3 mg by mouth See admin instructions. 2 mg on Mon & Fri, 3 mg all other days   Yes [provider]    Physical Exam: Vitals:   11/28/17 0009 11/28/17 0013 11/28/17 0313  BP: 130/82  (!) 146/80  Pulse: 95  88  Resp: 18  16  Temp: 98.3 F (36.8 C)  98 F (36.7 C)  TempSrc: Oral  Oral  SpO2: 95%  95%  Weight:  69.9 kg   Height:  5\' 4"  (1.626 m)     Constitutional: NAD, appears uncomfortable Eyes: PERTLA, lids and conjunctivae normal ENMT: Mucous membranes are moist. Posterior  pharynx clear of any exudate or lesions.   Neck: normal, supple, no masses, no thyromegaly Respiratory: clear to auscultation bilaterally, no wheezing, no crackles. Normal respiratory effort.    Cardiovascular: S1 & S2 heard, regular rate and rhythm. No extremity edema.   Abdomen: Soft, tender diffusely with voluntary guarding, no rebound pain. Bowel sounds active.  Musculoskeletal: no clubbing / cyanosis. No joint deformity upper and lower extremities.   Skin: no significant rashes, lesions, ulcers. Warm, dry, well-perfused. Neurologic: No gross facial asymmetry. Sensation intact. Moving all extremities.   Psychiatric: Alert and oriented x 3. Pleasant and cooperative.    Labs on Admission: I have personally reviewed following labs and imaging studies  CBC: Recent Labs  Lab 11/28/17 0120  WBC 11.2*  NEUTROABS 8.6*  HGB 12.3*  HCT 38.4*  MCV 95.3  PLT 196  Basic Metabolic Panel: Recent Labs  Lab 11/28/17 0120  NA 137  K 4.4  CL 104  CO2 22  GLUCOSE 157*  BUN 13  CREATININE 0.98  CALCIUM 10.2   GFR: Estimated Creatinine Clearance: 53.7 mL/min (by C-G formula based on SCr of 0.98 mg/dL). Liver Function Tests: Recent Labs  Lab 11/28/17 0120  AST 41  ALT 27  ALKPHOS 90  BILITOT 1.1  PROT 7.7  ALBUMIN 3.8   Recent Labs  Lab 11/28/17 0120  LIPASE 46   No results for input(s): AMMONIA in the last 168 hours. Coagulation Profile: No results for input(s): INR, PROTIME in the last 168 hours. Cardiac Enzymes: No results for input(s): CKTOTAL, CKMB, CKMBINDEX, TROPONINI in the last 168 hours. BNP (last 3 results) No results for input(s): PROBNP in the last 8760 hours. HbA1C: No results for input(s): HGBA1C in the last 72 hours. CBG: No results for input(s): GLUCAP in the last 168 hours. Lipid Profile: No results for input(s): CHOL, HDL, LDLCALC, TRIG, CHOLHDL, LDLDIRECT in the last 72 hours. Thyroid Function Tests: No results for input(s): TSH, T4TOTAL, FREET4,  T3FREE, THYROIDAB in the last 72 hours. Anemia Panel: No results for input(s): VITAMINB12, FOLATE, FERRITIN, TIBC, IRON, RETICCTPCT in the last 72 hours. Urine analysis:    Component Value Date/Time   COLORURINE STRAW (A) 11/28/2017 0224   APPEARANCEUR CLEAR 11/28/2017 0224   LABSPEC 1.009 11/28/2017 0224   PHURINE 6.0 11/28/2017 0224   GLUCOSEU NEGATIVE 11/28/2017 0224   HGBUR SMALL (A) 11/28/2017 0224   BILIRUBINUR NEGATIVE 11/28/2017 0224   KETONESUR NEGATIVE 11/28/2017 0224   PROTEINUR NEGATIVE 11/28/2017 0224   UROBILINOGEN 0.2 06/09/2009 1446   NITRITE NEGATIVE 11/28/2017 0224   LEUKOCYTESUR TRACE (A) 11/28/2017 0224   Sepsis Labs: @LABRCNTIP (procalcitonin:4,lacticidven:4) )No results found for this or any previous visit (from the past 240 hour(s)).   Radiological Exams on Admission: Ct Abdomen Pelvis W Contrast  Result Date: 11/28/2017 CLINICAL DATA:  Abdominal pain and nausea. EXAM: CT ABDOMEN AND PELVIS WITH CONTRAST TECHNIQUE: Multidetector CT imaging of the abdomen and pelvis was performed using the standard protocol following bolus administration of intravenous contrast. CONTRAST:  100mL ISOVUE-300 IOPAMIDOL (ISOVUE-300) INJECTION 61% COMPARISON:  None. FINDINGS: Lower chest: Lung bases are clear. Esophageal varices. Coronary artery calcifications. Hepatobiliary: Hepatic cirrhosis with enlargement of the lateral segment left and caudate lobes as well as nodular contour. Focal wedge-shaped peripheral low-attenuation lesion in the right lobe of the liver is unchanged since prior studies dating back to 07/12/2009. Long-term stability suggests benign etiology. Gallbladder and bile ducts are unremarkable. Pancreas: Fatty infiltration of the pancreas. Cystic lesion in the tail of the pancreas measuring 1.8 cm diameter. No change since MRI abdomen 05/29/2016. No pancreatic ductal dilatation. Calcifications in the head of the pancreas consistent with chronic pancreatitis. Infiltration  in the fat around the head and body of the pancreas extending around the celiac axis and around the duodenum to the right anterior pararenal space. There is associated duodenal wall thickening. This could represent acute pancreatitis associated with duodenitis or peptic ulcer disease. Scattered peripancreatic lymph nodes are probably reactive. Spleen: Spleen is not significantly enlarged.  No focal lesions. Adrenals/Urinary Tract: No adrenal gland nodules. Stones in both kidneys, largest in the left lower pole measures 13 mm diameter. Cyst in the lower pole left kidney is unchanged. Low-attenuation lesion with calcification in the posterior inferior left kidney is unchanged since prior study and looks postoperative. Could also represent dystrophic calcification. No hydronephrosis or hydroureter. Cyst  in the lower pole left kidney. Bladder is unremarkable. Stomach/Bowel: Wall thickening and inflammatory infiltration around the second portion of the duodenum. Stomach, small bowel, and colon are not abnormally distended. Scattered stool throughout the colon. Appendix is not identified. Vascular/Lymphatic: Scattered calcifications in the abdominal aorta. No aortic aneurysm. No significant lymphadenopathy. Inferior vena caval filter. Reproductive: Prostate gland is diffusely enlarged, measuring 5.3 cm in diameter. Prostate calcifications are present. Other: Postoperative changes consistent with ventral abdominal wall hernia repair. No free air or free fluid in the abdomen. Musculoskeletal: Degenerative changes in the spine. Old compression of L1. Schmorl's nodes at T11 and T12. No change since prior study. Geographic sclerosis in the superior aspect of the right femoral head may represent avascular necrosis. IMPRESSION: 1. Inflammatory infiltration around the head and body of the pancreas and of the second portion the duodenum with duodenal wall thickening. Changes likely represent acute pancreatitis associated with  duodenitis or peptic ulcer disease. 2. Calcifications in the pancreas consistent with chronic pancreatitis. 3. 1.8 cm diameter cystic lesion in the tail of the pancreas is unchanged since prior study. 4. Hepatic cirrhosis with evidence of portal venous hypertension including upper abdominal and esophageal varices. Focal lesion in segment 7 of the liver is unchanged since prior study of 2011 suggesting benign etiology. 5. Aortic atherosclerosis. 6. Bilateral nonobstructing renal stones. 7. Enlarged prostate gland. Electronically Signed   By: Burman Nieves M.D.   On: 11/28/2017 02:52    EKG: Not performed.   Assessment/Plan   1. Acute abdominal pain  - Presents with severe abdominal pain and nausea without fever, vomiting, or diarrhea  - CT reveals inflammation about the pancreas and 2nd portion of duodenum with duodenal wall-thickening  - Could be acute pancreatitis or PUD  - Denies any significant EtOH use, GB and bile ducts appear normal on CT  - Bowel-rest, check triglyceride level, start IVF hydration and PPI, continue pain-control and antiemetics      2. Paroxysmal atrial fibrillation  - CHADS-VASc is at least 45 (age x2, CVA x2, DM); he also has hx of LLE DVT and PE  - Check INR, use heparin if needed while NPO  3. Type II DM  - A1c was 8.4% in October '19  - Managed at home with glipizide and metformin, held on admission - Check CBG's and use a low-intensity SSI with Novolog for now   4. Liver cirrhosis  - Uncertain etiology, denies hx of significant EtOH use or hx of viral hepatitis  - Appears compensated   - Esophageal varices noted on CT but no ascites  - Started on PPI as above   5. History of CVA  - Statin held while NPO  - He takes warfarin for stroke-prevention in a fib; INR pending with plan for heparin while NPO if INR subtherapeutic    DVT prophylaxis: warfarin  Code Status: Full  Family Communication: Discussed with patient  Consults called: none Admission  status: Observation     Briscoe Deutscher, MD [facility] Pager [phone]  If 7PM-7AM, please contact night-coverage [email or web address] Password [email or web address]  11/28/2017, 4:12 AM

## 2017-11-28 NOTE — Progress Notes (Signed)
ANTICOAGULATION CONSULT NOTE - Initial Consult  Pharmacy Consult for warfarin Indication: afib, hx DVT/PE  Allergies  Allergen Reactions  . Prednisone     Other reaction(s): Other (See Comments) unknown  . Zithromax [Azithromycin] Other (See Comments)    Weak and flulike symptoms    Patient Measurements: Height: 5\' 4"  (162.6 cm) Weight: 154 lb (69.9 kg) IBW/kg (Calculated) : 59.2  Vital Signs: Temp: 97.6 F (36.4 C) (11/24 1422) Temp Source: Oral (11/24 1422) BP: 127/75 (11/24 1422) Pulse Rate: 75 (11/24 1422)  Labs: Recent Labs    11/28/17 0120 11/28/17 0537  HGB 12.3*  --   HCT 38.4*  --   PLT 196  --   LABPROT  --  37.4*  INR  --  3.87  CREATININE 0.98  --     Estimated Creatinine Clearance: 53.7 mL/min (by C-G formula based on SCr of 0.98 mg/dL).   Medical History: Past Medical History:  Diagnosis Date  . Cirrhosis (HCC)    noted on CT 05/28/16  . Closed compression fracture of L1 lumbar vertebra 05/2016  . Diabetes mellitus   . Diverticulitis   . DVT (deep venous thrombosis) (HCC) 2002  . Dyspnea    with exertion  . History of kidney stones   . Hyperlipidemia   . Hypertension   . Insomnia   . Kidney stones    hx of  . Liver mass, right lobe    noted on CT 05/28/16  . Pancreatic cyst    noted on CT 05/28/16  . Perforated diverticulum   . Presence of inferior vena cava filter 2002  . Pulmonary embolism (HCC) 2002  . Secondary hypercoagulability disorder (HCC)   . Status post placement of ureteral stent:R 10/15/2017 10/26/2017  . Stroke [facility](HCC) 2002   right eye no peripheral vision, short term memory loss   Assessment: Pharmacy consulted to dose and monitor warfarin. Pt was taking PTA for history of afib and VTE.   Home regimen (confirmed with patient):  Monday and Friday 4 mg  Sun, Tues, Wed, Thurs, Sat 3 mg   INR = 3.87 is supratherapeutic on admission  Today, 11/28/17  Hgb 12.3  Plt 196 - WNL  INR 3.9 is supratherapeutic on  admission  No signs/symptoms of bleeding per RN   Goal of Therapy:  INR 2-3 Monitor platelets by anticoagulation protocol: Yes   Plan:   Will HOLD warfarin this evening given elevated INR  Protime/INR daily  Monitor for signs/symptoms of bleeding or thrombosis  Cindi CarbonMary M Correen Bubolz, PharmD Clinical Pharmacist 11/28/2017,4:29 PM

## 2017-11-28 NOTE — ED Notes (Signed)
Bed: [id number] Expected date:  Expected time:  Means of arrival:  Comments: abd pain x 3 days

## 2017-11-28 NOTE — ED Triage Notes (Signed)
Pt c/o abd pain r/t kidney stone. Pt had sx and stent placed "a few weeks ago". Pt c/o intermittent abd pain since 17Nov19.

## 2017-11-28 NOTE — ED Provider Notes (Signed)
[facility]-EMERGENCY DEPT Provider Note   CSN: [id number] Arrival date & time: 11/28/17  0001     History   Chief Complaint Chief Complaint  Patient presents with  . Abdominal Pain    HPI [name] is a 77 y.o. male.  HPI  This is a 78 year old male with a history of cirrhosis, diverticulitis, DVT, pancreatic cyst, recent admission for enterococcus sepsis secondary to pyelonephritis who presents with abdominal pain.  Patient reports that since he was discharged from rehab, he has had intermittent abdominal pain.  He reports that it is sharp and "all over.  He does not associate it with food intake.  He has not had any nausea or vomiting.  He reports normal bowel movements.  He is not taking anything for the pain.  Currently he rates his pain 8 out of 10.  He states it got worse tonight.  He denies any urinary symptoms or fevers.  Denies chest pain.    Chart reviewed.  Patient with recent admission for enterococcus sepsis secondary to pyelonephritis, he also had a UPJ stent with removal.  Past Medical History:  Diagnosis Date  . Cirrhosis (HCC)    noted on CT 05/28/16  . Closed compression fracture of L1 lumbar vertebra 05/2016  . Diabetes mellitus   . Diverticulitis   . DVT (deep venous thrombosis) (HCC) 2002  . Dyspnea    with exertion  . History of kidney stones   . Hyperlipidemia   . Hypertension   . Insomnia   . Kidney stones    hx of  . Liver mass, right lobe    noted on CT 05/28/16  . Pancreatic cyst    noted on CT 05/28/16  . Perforated diverticulum   . Presence of inferior vena cava filter 2002  . Pulmonary embolism (HCC) 2002  . Secondary hypercoagulability disorder (HCC)   . Status post placement of ureteral stent:R 10/15/2017 10/26/2017  . Stroke [facility]) 2002   right eye no peripheral vision, short term memory loss    Patient Active Problem List   Diagnosis Date Noted  . Acute pancreatitis 11/28/2017  . Sepsis (HCC) 10/26/2017    . Severe sepsis with acute organ dysfunction (HCC) 10/26/2017  . Metabolic acidosis 10/26/2017  . Pyelonephritis 10/26/2017  . Status post placement of ureteral stent:R 10/15/2017 10/26/2017  . Dehydration 10/26/2017  . Right ureteral stone 10/15/2017  . Cirrhosis of liver (HCC) 06/07/2016  . Pancreatic cyst 06/07/2016  . AF (atrial fibrillation) (HCC) 06/07/2016  . Hyperlipidemia 06/07/2016  . History of stroke 06/07/2016  . Thrombocytopenia (HCC) 06/07/2016  . Closed compression fracture of L1 lumbar vertebra 05/28/2016  . HTN (hypertension) 05/28/2016  . Diabetes mellitus type II, non insulin dependent (HCC) 05/28/2016  . Closed compression fracture of body of lumbar vertebra (HCC) 05/28/2016  . Incisional hernia x 2.  Repaired 9/60/2013. 08/21/2011  . Presence of inferior vena cava filter 01/06/2000    Past Surgical History:  Procedure Laterality Date  . APPENDECTOMY  1949  . BACK SURGERY    . CARPAL TUNNEL RELEASE  2002 both wrists  . COLON SURGERY  07/19/2009   sigmoid colectomy   . CYSTOSCOPY WITH BIOPSY N/A 01/26/2017   Procedure: CYSTOSCOPY;  Surgeon: Bjorn Pippin, MD;  Location: [facility];  Service: Urology;  Laterality: N/A;  . CYSTOSCOPY WITH STENT PLACEMENT Right 10/15/2017   Procedure: CYSTOSCOPY WITH STENT PLACEMENT;  Surgeon: Heloise Purpura, MD;  Location: [facility];  Service: Urology;  Laterality: Right;  . CYSTOSCOPY/URETEROSCOPY/HOLMIUM LASER/STENT PLACEMENT Right 11/04/2017   Procedure: CYSTOSCOPY/RIGHT URETEROSCOPY/HOLMIUM LASER/STENT EXCHANGE;  Surgeon: Bjorn PippinWrenn, John, MD;  Location: [facility];  Service: Urology;  Laterality: Right;  . HERNIA REPAIR   1983, 1983, 02/03/2010   lap ventral hernia repair  . KIDNEY STONE SURGERY  1977, 1983  . LITHOTRIPSY  2010   bleeding in kidney after  . surgery for diverticulosis  2011  . TEE WITHOUT CARDIOVERSION N/A 11/03/2017   Procedure: TRANSESOPHAGEAL ECHOCARDIOGRAM (TEE);  Surgeon: Jodelle Redhristopher, Bridgette,  MD;  [facility];  Service: Cardiovascular;  Laterality: N/A;  . TONSILLECTOMY  1947  . TONSILLECTOMY AND ADENOIDECTOMY    . VENTRAL HERNIA REPAIR  09/11/2011   Procedure: LAPAROSCOPIC VENTRAL HERNIA;  Surgeon: Kandis Cockingavid H Newman, MD;  Location: [facility];  Service: General;  Laterality: N/A;  Lapaaroscopic Repair Ventral Incisional Hernias x 2        Home Medications    Prior to Admission medications   Medication Sig Start Date End Date Taking? Authorizing Provider  glipiZIDE (GLUCOTROL XL) 2.5 MG 24 hr tablet Take 2.5 mg by mouth daily after breakfast.  10/18/17  Yes [provider]  losartan (COZAAR) 100 MG tablet Take 100 mg by mouth every morning.    Yes [provider]  metFORMIN (GLUCOPHAGE-XR) 500 MG 24 hr tablet Take 1,000 mg by mouth 2 (two) times daily. 10/16/17  Yes [provider]  ondansetron (ZOFRAN) 4 MG tablet Take 1 tablet (4 mg total) by mouth every 8 (eight) hours as needed for nausea or vomiting. 10/15/17  Yes Heloise PurpuraBorden, Lester, MD  simvastatin (ZOCOR) 40 MG tablet Take 40 mg by mouth every evening.    Yes [provider]  warfarin (COUMADIN) 2 MG tablet Take 2-3 mg by mouth See admin instructions. 2 mg on Mon & Fri, 3 mg all other days   Yes [provider]  HYDROcodone-acetaminophen (NORCO/VICODIN) 5-325 MG tablet Take 1-2 tablets by mouth every 6 (six) hours as needed. Patient not taking: Reported on 11/28/2017 10/15/17   Heloise PurpuraBorden, Lester, MD    Family History Family History  Problem Relation Age of Onset  . Lung disease Father   . Cancer Neg Hx     Social History Social History   Tobacco Use  . Smoking status: Never Smoker  . Smokeless tobacco: Never Used  Substance Use Topics  . Alcohol use: No  . Drug use: No     Allergies   Prednisone and Zithromax [azithromycin]   Review of Systems Review of Systems  Constitutional: Negative for fever.  Respiratory: Negative for shortness of breath.     Cardiovascular: Negative for chest pain.  Gastrointestinal: Positive for abdominal pain. Negative for constipation, diarrhea, nausea and vomiting.  Genitourinary: Negative for dysuria.  All other systems reviewed and are negative.    Physical Exam Updated Vital Signs BP (!) 146/80 (BP Location: Left Arm)   Pulse 88   Temp 98 F (36.7 C) (Oral)   Resp 16   Ht 1.626 m (5\' 4" )   Wt 69.9 kg   SpO2 95%   BMI 26.43 kg/m   Physical Exam  Constitutional: He is oriented to person, place, and time.  Elderly, nontoxic-appearing, no acute distress  HENT:  Head: Normocephalic and atraumatic.  Eyes: Pupils are equal, round, and reactive to light.  Neck: Neck supple.  Cardiovascular: Normal rate, regular rhythm and normal heart sounds.  No murmur heard. Pulmonary/Chest: Effort normal and breath sounds normal. No respiratory distress. He  has no wheezes.  Abdominal: Soft. Bowel sounds are normal. He exhibits distension. There is generalized tenderness. There is no rebound.  Diffuse generalized tenderness to palpation, no rebound or guarding noted  Musculoskeletal: He exhibits no edema.  Lymphadenopathy:    He has no cervical adenopathy.  Neurological: He is alert and oriented to person, place, and time.  Skin: Skin is warm and dry.  Psychiatric: He has a normal mood and affect.  Nursing note and vitals reviewed.    ED Treatments / Results  Labs (all labs ordered are listed, but only abnormal results are displayed) Labs Reviewed  CBC WITH DIFFERENTIAL/PLATELET - Abnormal; Notable for the following components:      Result Value   WBC 11.2 (*)    RBC 4.03 (*)    Hemoglobin 12.3 (*)    HCT 38.4 (*)    Neutro Abs 8.6 (*)    All other components within normal limits  COMPREHENSIVE METABOLIC PANEL - Abnormal; Notable for the following components:   Glucose, Bld 157 (*)    All other components within normal limits  URINALYSIS, ROUTINE W REFLEX MICROSCOPIC - Abnormal; Notable for the  following components:   Color, Urine STRAW (*)    Hgb urine dipstick SMALL (*)    Leukocytes, UA TRACE (*)    All other components within normal limits  LIPASE, BLOOD  PROTIME-INR    EKG None  Radiology Ct Abdomen Pelvis W Contrast  Result Date: 11/28/2017 CLINICAL DATA:  Abdominal pain and nausea. EXAM: CT ABDOMEN AND PELVIS WITH CONTRAST TECHNIQUE: Multidetector CT imaging of the abdomen and pelvis was performed using the standard protocol following bolus administration of intravenous contrast. CONTRAST:  ISOVUE-300 IOPAMIDOL (ISOVUE-300) INJECTION 61% COMPARISON:  None. FINDINGS: Lower chest: Lung bases are clear. Esophageal varices. Coronary artery calcifications. Hepatobiliary: Hepatic cirrhosis with enlargement of the lateral segment left and caudate lobes as well as nodular contour. Focal wedge-shaped peripheral low-attenuation lesion in the right lobe of the liver is unchanged since prior studies dating back to 07/12/2009. Long-term stability suggests benign etiology. Gallbladder and bile ducts are unremarkable. Pancreas: Fatty infiltration of the pancreas. Cystic lesion in the tail of the pancreas measuring 1.8 cm diameter. No change since MRI abdomen 05/29/2016. No pancreatic ductal dilatation. Calcifications in the head of the pancreas consistent with chronic pancreatitis. Infiltration in the fat around the head and body of the pancreas extending around the celiac axis and around the duodenum to the right anterior pararenal space. There is associated duodenal wall thickening. This could represent acute pancreatitis associated with duodenitis or peptic ulcer disease. Scattered peripancreatic lymph nodes are probably reactive. Spleen: Spleen is not significantly enlarged.  No focal lesions. Adrenals/Urinary Tract: No adrenal gland nodules. Stones in both kidneys, largest in the left lower pole measures 13 mm diameter. Cyst in the lower pole left kidney is unchanged. Low-attenuation  lesion with calcification in the posterior inferior left kidney is unchanged since prior study and looks postoperative. Could also represent dystrophic calcification. No hydronephrosis or hydroureter. Cyst in the lower pole left kidney. Bladder is unremarkable. Stomach/Bowel: Wall thickening and inflammatory infiltration around the second portion of the duodenum. Stomach, small bowel, and colon are not abnormally distended. Scattered stool throughout the colon. Appendix is not identified. Vascular/Lymphatic: Scattered calcifications in the abdominal aorta. No aortic aneurysm. No significant lymphadenopathy. Inferior vena caval filter. Reproductive: Prostate gland is diffusely enlarged, measuring 5.3 cm in diameter. Prostate calcifications are present. Other: Postoperative changes consistent with ventral abdominal wall hernia  repair. No free air or free fluid in the abdomen. Musculoskeletal: Degenerative changes in the spine. Old compression of L1. Schmorl's nodes at T11 and T12. No change since prior study. Geographic sclerosis in the superior aspect of the right femoral head may represent avascular necrosis. IMPRESSION: 1. Inflammatory infiltration around the head and body of the pancreas and of the second portion the duodenum with duodenal wall thickening. Changes likely represent acute pancreatitis associated with duodenitis or peptic ulcer disease. 2. Calcifications in the pancreas consistent with chronic pancreatitis. 3. 1.8 cm diameter cystic lesion in the tail of the pancreas is unchanged since prior study. 4. Hepatic cirrhosis with evidence of portal venous hypertension including upper abdominal and esophageal varices. Focal lesion in segment 7 of the liver is unchanged since prior study of 2011 suggesting benign etiology. 5. Aortic atherosclerosis. 6. Bilateral nonobstructing renal stones. 7. Enlarged prostate gland. Electronically Signed   By: Burman Nieves M.D.   On: 11/28/2017 02:52     Procedures Procedures (including critical care time)  Medications Ordered in ED Medications  iopamidol (ISOVUE-300) 61 % injection (has no administration in time range)  sodium chloride (PF) 0.9 % injection (has no administration in time range)  famotidine (PEPCID) IVPB 20 mg premix (20 mg Intravenous New Bag/Given 11/28/17 0331)  morphine 4 MG/ML injection 4 mg (4 mg Intravenous Given 11/28/17 0134)  ondansetron (ZOFRAN) injection 4 mg (4 mg Intravenous Given 11/28/17 0135)  iopamidol (ISOVUE-300) 61 % injection 100 mL (100 mLs Intravenous Contrast Given 11/28/17 0230)  fentaNYL (SUBLIMAZE) injection 50 mcg (50 mcg Intravenous Given 11/28/17 0328)     Initial Impression / Assessment and Plan / ED Course  I have reviewed the triage vital signs and the nursing notes.  Pertinent labs & imaging results that were available during my care of the patient were reviewed by me and considered in my medical decision making (see chart for details).     Presents with abdominal pain.  Reports pain since leaving rehab.  Recent complicated past medical history including sepsis, pyelonephritis, stent placement with removal.  He is overall nontoxic-appearing and vital signs are reassuring.  He has some diffuse tenderness on exam.  No distention or hyperactive bowel sounds.  Patient was given pain and nausea medication.  Lab work obtained and is largely reassuring.  No significant evidence of UTI.  No leukocytosis.  Liver enzymes and lipase are normal.  Given age and complicated history, CT scan was obtained.  CT scan shows inflammatory signs around the pancreas and the duodenum.  This could reflect chronic pancreatitis.  Lipase is normal.  Also could represent possible ulcer.  With recent hospitalization, this could be the cause.  Patient was given IV Pepcid.  He has received multiple doses of pain medication while in the ED.  No vomiting.  However, given inability to control pain, will admit for  observation and pain control.  He may need GI evaluation.  Pt discussed with Dr. Antionette Char.  Final Clinical Impressions(s) / ED Diagnoses   Final diagnoses:  Generalized abdominal pain  Other chronic pancreatitis [facility])    ED Discharge Orders    None       Shon Baton, MD 11/28/17 [phone]

## 2017-11-29 DIAGNOSIS — K859 Acute pancreatitis without necrosis or infection, unspecified: Secondary | ICD-10-CM | POA: Diagnosis not present

## 2017-11-29 LAB — CBC WITH DIFFERENTIAL/PLATELET
ABS IMMATURE GRANULOCYTES: 0.02 10*3/uL (ref 0.00–0.07)
BASOS ABS: 0 10*3/uL (ref 0.0–0.1)
BASOS PCT: 1 %
Eosinophils Absolute: 0.2 10*3/uL (ref 0.0–0.5)
Eosinophils Relative: 3 %
HCT: 33.2 % — ABNORMAL LOW (ref 39.0–52.0)
HEMOGLOBIN: 10.6 g/dL — AB (ref 13.0–17.0)
Immature Granulocytes: 0 %
LYMPHS PCT: 20 %
Lymphs Abs: 1.2 10*3/uL (ref 0.7–4.0)
MCH: 29.9 pg (ref 26.0–34.0)
MCHC: 31.9 g/dL (ref 30.0–36.0)
MCV: 93.5 fL (ref 80.0–100.0)
MONO ABS: 0.5 10*3/uL (ref 0.1–1.0)
Monocytes Relative: 9 %
Neutro Abs: 4 10*3/uL (ref 1.7–7.7)
Neutrophils Relative %: 67 %
PLATELETS: 177 10*3/uL (ref 150–400)
RBC: 3.55 MIL/uL — AB (ref 4.22–5.81)
RDW: 14.4 % (ref 11.5–15.5)
WBC: 6 10*3/uL (ref 4.0–10.5)
nRBC: 0 % (ref 0.0–0.2)

## 2017-11-29 LAB — COMPREHENSIVE METABOLIC PANEL
ALT: 20 U/L (ref 0–44)
AST: 27 U/L (ref 15–41)
Albumin: 2.9 g/dL — ABNORMAL LOW (ref 3.5–5.0)
Alkaline Phosphatase: 68 U/L (ref 38–126)
Anion gap: 6 (ref 5–15)
BILIRUBIN TOTAL: 1.1 mg/dL (ref 0.3–1.2)
BUN: 12 mg/dL (ref 8–23)
CHLORIDE: 108 mmol/L (ref 98–111)
CO2: 24 mmol/L (ref 22–32)
CREATININE: 0.89 mg/dL (ref 0.61–1.24)
Calcium: 8.4 mg/dL — ABNORMAL LOW (ref 8.9–10.3)
GFR calc Af Amer: >60 mL/min (ref >60)
Glucose, Bld: 124 mg/dL — ABNORMAL HIGH (ref 70–99)
Potassium: 4 mmol/L (ref 3.5–5.1)
Sodium: 138 mmol/L (ref 135–145)
Total Protein: 6.2 g/dL — ABNORMAL LOW (ref 6.5–8.1)

## 2017-11-29 LAB — GLUCOSE, CAPILLARY
GLUCOSE-CAPILLARY: 127 mg/dL — AB (ref 70–99)
GLUCOSE-CAPILLARY: 106 mg/dL — AB (ref 70–99)
GLUCOSE-CAPILLARY: 110 mg/dL — AB (ref 70–99)
Glucose-Capillary: 133 mg/dL — ABNORMAL HIGH (ref 70–99)
Glucose-Capillary: 107 mg/dL — ABNORMAL HIGH (ref 70–99)

## 2017-11-29 LAB — PROTIME-INR
INR: 3.95
PROTHROMBIN TIME: 38 s — AB (ref 11.4–15.2)

## 2017-11-29 LAB — LIPASE, BLOOD: LIPASE: 35 U/L (ref 11–51)

## 2017-11-29 MED ORDER — PANTOPRAZOLE SODIUM 40 MG PO TBEC: 40.0000 mg | DELAYED_RELEASE_TABLET | Freq: Every day | ORAL | 0 refills | Status: AC

## 2017-11-29 NOTE — Progress Notes (Signed)
ANTICOAGULATION CONSULT NOTE  Pharmacy Consult for warfarin Indication: afib, hx DVT/PE  Allergies  Allergen Reactions  . Prednisone     Other reaction(s): Other (See Comments) unknown  . Zithromax [Azithromycin] Other (See Comments)    Weak and flulike symptoms    Patient Measurements: Height: 5\' 4"  (162.6 cm) Weight: 154 lb (69.9 kg) IBW/kg (Calculated) : 59.2  Vital Signs: Temp: 98 F (36.7 C) (11/25 0629) Temp Source: Oral (11/25 0629) BP: 121/70 (11/25 0629) Pulse Rate: 81 (11/25 0629)  Labs: Recent Labs    11/28/17 0120 11/28/17 0537 11/29/17 0444  HGB 12.3*  --  10.6*  HCT 38.4*  --  33.2*  PLT 196  --  177  LABPROT  --  37.4* 38.0*  INR  --  3.87 3.95  CREATININE 0.98  --  0.89    Estimated Creatinine Clearance: 59.1 mL/min (by C-G formula based on SCr of 0.89 mg/dL).  Assessment: [age] yoM with PMH AFib, VTE on warfarin, admitted for  Pharmacy consulted to dose and monitor warfarin.   Home regimen (confirmed with patient): 4 mg Mon & Fri; 3 mg all other days; last dose 11/23 PM  INR = 3.87 is supratherapeutic on admission  Cirrhosis noted on imaging, but no ascites and Albumin/LFTs WNL on admission (INR confounded by warfarin)  Today, 11/29/17  Hgb down 2g, but still > 10; suspect dilutional  Plt stable WNL  INR remains SUPRAtherapeutic and slightly higher today  No signs/symptoms of bleeding documented  Goal of Therapy:  INR 2-3 Monitor platelets by anticoagulation protocol: Yes   Plan:   Continue to hold warfarin; may need to use heparin if INR drops below 2.0 while still NPO  Protime/INR daily  Monitor for signs/symptoms of bleeding or thrombosis  Bernadene Personrew Stephane Junkins, PharmD, BCPS [phone] 11/29/2017, 10:07 AM

## 2017-11-29 NOTE — Care Management Obs Status (Signed)
MEDICARE OBSERVATION STATUS NOTIFICATION   Patient Details  Name: [name] MRN: [id number] Date of Birth: Jan 17, 1940   Medicare Observation Status Notification Given:       Alexis Goodelleele, Daine Croker K, RN 11/29/2017, 11:57 AM

## 2017-11-29 NOTE — Discharge Summary (Signed)
Physician Discharge Summary  Dustin GilfordJackie Harmon [name]:[id number]:[id number] DOB: [birth year] DOA: 11/28/2017  PCP: Daisy Florooss, Charles Alan, MD  Admit date: 11/28/2017 Discharge date: 11/29/2017  Time spent: 40 minutes  Recommendations for Outpatient Follow-up:  1. Follow up outpatient CBC/CMP 2. Follow up for possibility of chronic pancreatitis 3. Follow up INR as outpatient, pt instructed to hold warfarin until appointment with Dr. Tenny Crawoss on Wednesday  4. Pt with cirrhosis and needs to establish with gastroenterology 5. Follow up imaging as noted below (annual MRI for subcapsular R hepatic lobe hyperechoic area noted on US)   Discharge Diagnoses:  Principal Problem:   Acute pancreatitis Active Problems:   Diabetes mellitus type II, non insulin dependent (HCC)   Cirrhosis of liver (HCC)   AF (atrial fibrillation) (HCC)   History of CVA (cerebrovascular accident)   Discharge Condition: stable  Diet recommendation: heart healthy  Filed Weights   11/28/17 0013  Weight: 69.9 kg    History of present illness:  [name] Webbis [name] 77 y.o.malewith medical history significant forliver cirrhosis, type 2 diabetes mellitus, atrial fibrillation on warfarin, history of DVT and PE, and history of CVA, now presenting to the emergency department for evaluation of severe abdominal pain. Patient reports some intermittent abdominal pain over the past week that has become more persistent and severe over the past couple days. Pain is mainly in the mid and right abdomen, associated with nausea but no vomiting, and without any appreciable alleviating or exacerbating factors identified. He denies any fevers, chills, or diarrhea. Denies indigestion, melena, or hematochezia. He hadhad [name] ureteral stent placed last month that has since been removed. He denies any dysuria or hematuria recently. Denies any significant alcohol abuse, denies history of hepatitis. Does not believe that he has had pancreatitis  before.  Hospital Course:  He was admitted for possible pancreatitis vs peptic ulcer disease.  He improved with bowel rest and PPI.  On the day of discharge he was tolerating [name] normal diet.  His INR was noted to be elevated on presentation and persistent on discharge.  He was instructed to hold his warfarin until follow up with his outpatient provider (he scheduled this for Wednesday at 11 am).  See below for additional details  1.Acute abdominal pain -Presented with severe abdominal pain and nausea without fever, vomiting, or diarrhea -CT reveals inflammation about the pancreas and 2nd portion of duodenum with duodenal wall-thickening - Pain has resolved at time of discharge -Could be acute pancreatitis or PUD - he also had findings consistent with chronic pancreatitis  - RUQ US negative for gallstones or biliary dilatation -Denies any significant EtOH use, GB and bile ducts appear normal on CT -Bowel-rest, check triglyceride level (108) - Discharged with protonix and plan for outpatient follow up  2.Paroxysmal atrial fibrillation -CHADS-VASc is at least 635 (age x2, CVA x2, DM); he also has hx of LLE DVT and PE - INR is supratherapeutic, of note, this was also the case during his last hospitalization (? If this may be related to his cirrhosis below).  He was instructed to hold his warfarin until he saw his primary care doctor.   3.Type II DM -A1c was 8.4% in October '19 -resume home meds at discharge  4.Liver cirrhosis -Uncertain etiology, denies hx of significant EtOH use or hx of viral hepatitis  - Appears compensated -Esophageal varices noted on CT but no ascites -Started on PPI as above  - needs GI follow up outpatient, discussed with patient  5. History of CVA  -  resume statin, hold warfarin for now  # Indeterminate subcapsular R hepatic lobe 2.3 cm hyperechoic area by [place]: recommending MRI annually    Procedures:  none  Consultations:  none  Discharge Exam: Vitals:   11/28/17 2057 11/29/17 0629  BP: 139/83 121/70  Pulse: 80 81  Resp: 20 18  Temp: 98.3 F (36.8 C) 98 F (36.7 C)  SpO2: 98% 97%   Feeling better.  Ready to go home Pain is resolved.  General: No acute distress. Cardiovascular: Heart sounds show [name] regular rate, and rhythm Lungs: Clear to auscultation bilaterally  Abdomen: Soft, nontender, nondistended Neurological: Alert and oriented 3. Moves all extremities 4. Cranial nerves II through XII grossly intact. Skin: Warm and dry. No rashes or lesions. Extremities: No clubbing or cyanosis. No edema. Psychiatric: Mood and affect are normal. Insight and judgment are appropriate.  Discharge Instructions   Discharge Instructions    Call MD for:  difficulty breathing, headache or visual disturbances   Complete by:  As directed    Call MD for:  extreme fatigue   Complete by:  As directed    Call MD for:  persistant dizziness or light-headedness   Complete by:  As directed    Call MD for:  persistant nausea and vomiting   Complete by:  As directed    Call MD for:  redness, tenderness, or signs of infection (pain, swelling, redness, odor or green/yellow discharge around incision site)   Complete by:  As directed    Call MD for:  severe uncontrolled pain   Complete by:  As directed    Call MD for:  temperature >100.4   Complete by:  As directed    Diet - low sodium heart healthy   Complete by:  As directed    Discharge instructions   Complete by:  As directed    You were seen for possible pancreatitis and peptic ulcer disease.  You improved with IV fluids and an acid blocker.  You may have some chronic pancreatitis.  We'll start you on an acid blocker.    We are going to send you home on protonix (acid blocker) to take daily.    Please follow up with Dr. Tenny Craw (you scheduled an appointment for this Wednesday at 11).  Your INR was high, you need to  follow up with Dr. Tenny Craw for [name] repeat INR check.  You also need to see [name] gastroenterologist for your cirrhosis.  Please discuss seeing [name] GI doctor with Dr. Tenny Craw.  You had Ozelle Brubacher liver lesion seen on imaging, this should be followed yearly.  Return for new, recurrent, or worsening symptoms.  Please ask your PCP to request records from this hospitalization so they know what was done and what the next steps will be.   Increase activity slowly   Complete by:  As directed      Allergies as of 11/29/2017      Reactions   Prednisone    Other reaction(s): Other (See Comments) unknown   Zithromax [azithromycin] Other (See Comments)   Weak and flulike symptoms      Medication List    TAKE these medications   glipiZIDE 2.5 MG 24 hr tablet Commonly known as:  GLUCOTROL XL Take 2.5 mg by mouth daily after breakfast.   losartan 100 MG tablet Commonly known as:  COZAAR Take 100 mg by mouth every morning.   metFORMIN 500 MG 24 hr tablet Commonly known as:  GLUCOPHAGE-XR Take 1,000 mg by mouth 2 (two)  times daily.   ondansetron 4 MG tablet Commonly known as:  ZOFRAN Take 1 tablet (4 mg total) by mouth every 8 (eight) hours as needed for nausea or vomiting.   pantoprazole 40 MG tablet Commonly known as:  PROTONIX Take 1 tablet (40 mg total) by mouth daily.   simvastatin 40 MG tablet Commonly known as:  ZOCOR Take 40 mg by mouth every evening.   warfarin 2 MG tablet Commonly known as:  COUMADIN Take 2-3 mg by mouth See admin instructions. 2 mg on Mon & Fri, 3 mg all other days      Allergies  Allergen Reactions  . Prednisone     Other reaction(s): Other (See Comments) unknown  . Zithromax [Azithromycin] Other (See Comments)    Weak and flulike symptoms   Follow-up Information    [name], MD Follow up.   Specialty:  Family Medicine Why:  Follow up with Dr. Tenny Craw for repeat INR check (don't take your warfarin until you follow up with Dr. Tenny Craw).    Ask about seeing Brick Ketcher  gastroenterologist for your cirrhosis. Contact information: [address] [place] [address] [phone]        [facility] Follow up.   Specialty:  Home Health Services Why:  nurse and physical therapy Contact information: [address] [place] [address] [phone]            The results of significant diagnostics from this hospitalization (including imaging, microbiology, ancillary and laboratory) are listed below for reference.    Significant Diagnostic Studies: Mr Abdomen W Wo Contrast  Result Date: 11/02/2017 CLINICAL DATA:  Abdominal infection. EXAM: MRI ABDOMEN WITHOUT AND WITH CONTRAST TECHNIQUE: Multiplanar multisequence MR imaging of the abdomen was performed both before and after the administration of intravenous contrast. CONTRAST:  10 cc Gadavist COMPARISON:  Multiple exams, including 05/29/2016 and 10/26/2017 CT scan FINDINGS: Despite efforts by the technologist and patient, motion artifact is present on today's exam and could not be eliminated. This reduces exam sensitivity and specificity. This is Alaisha Eversley common result when MRI abdomen is attempted in the inpatient setting where patients are less likely to be able to breath hold and cooperate in controlling motion. Lower chest: Trace right pleural effusion. Hepatobiliary: Nodular hepatic morphology compatible with cirrhosis. The peripheral triangular lesion in segment 7 of the liver posterolaterally measures 1.8 by 1.9 cm and has low T1 and equivocally increased T2 signal, and generally hypo enhances with respect to the rest of the liver although does have some internal enhancement which becomes isointense to the rest of the liver on delayed images. This lesion was documented is measuring 3.5 cm on the prior MRI abdomen from 05/17/2002, and measured 3.5 cm on the CT scan from 11/27/2006. Given the long-term lack of progression this lesion is felt to be benign. Tiny cyst in segment  3 of the liver, image 59/1003. Gallbladder unremarkable.  No appreciable biliary dilatation. Pancreas: 1.2 by 1.1 cm cystic lesion in the tail the pancreas, not changed from 05/29/2016. Otherwise unremarkable. Spleen:  Unremarkable Adrenals/Urinary Tract: Adrenal glands normal. Non rotated left kidney with Azzure Garabedian left kidney lower pole Bosniak category 1 cyst. There is abnormal right perirenal stranding along with accentuated enhancement along the right collecting system and right proximal ureter as well as mild right hydronephrosis. The patient has Somya Jauregui ureteral stent in place and there is Trang Bouse filling defect posteriorly in the collecting system measuring 8 mm in diameter on image 50/6 compatible  with Arvis Zwahlen collecting system calculus. There is also Sunshyne Horvath right kidney lower pole calculus as shown on recent CT. Low signal intensity along the posterior margin of the left kidney corresponds to the high density in this vicinity on recent CT. Stomach/Bowel: Mild wall thickening and potential fold effacement in the descending duodenum and proximal transverse duodenum, suspicious for duodenitis. Vascular/Lymphatic: IVC filter noted. Aortoiliac atherosclerotic vascular disease. Uphill varices. Patent portal vein and splenic vein. Other: Perihepatic and perisplenic ascites along with ascites tracking in both paracolic gutters. Musculoskeletal: L2 compression fracture. Large Schmorl's node along the superior endplates of T12 and L1. IMPRESSION: 1. Cirrhosis and ascites. Uphill varices indicating portal venous hypertension. Patent portal vein and splenic vein. 2. Mild right hydronephrosis despite the right ureteral stent. Accentuated enhancement along the collecting system in ureteral wall is likely due to irritation. There [name] 8 mm calculus posteriorly in the right collecting system and Jessen Siegman calculus in the right kidney lower pole. 3. 1.2 by 1.1 cm cystic lesion in the tail the pancreas, stable from 05/29/2016. This documents 17 months of stability.  Follow up pancreatic protocol MRI with and without contrast is recommended in 2 years time. This recommendation follows ACR consensus guidelines: Management of Incidental Pancreatic Cysts: Maximillion Gill White Paper of the ACR Incidental Findings Committee. J Am Coll Radiol 2017;14:911-923. 4. Trace right pleural effusion. 5. 1.9 cm peripheral triangular lesion in segment 7 of the liver has reduced in size back through 2004. This is felt to be Esai Stecklein benign lesion such as atypical hemangioma. Given the long-term lack of progression this lesion is considered benign. 6.  Aortic Atherosclerosis (ICD10-I70.0). 7. Stable appearance of the L2 compression fracture. 8. Non rotated left kidney with low signal material along the posterior margin corresponding with the calcifications in this vicinity. Known left nephrolithiasis. Electronically Signed   By: Gaylyn Rong M.D.   On: 11/02/2017 10:40   Ct Abdomen Pelvis W Contrast  Result Date: 11/28/2017 CLINICAL DATA:  Abdominal pain and nausea. EXAM: CT ABDOMEN AND PELVIS WITH CONTRAST TECHNIQUE: Multidetector CT imaging of the abdomen and pelvis was performed using the standard protocol following bolus administration of intravenous contrast. CONTRAST:  ISOVUE-300 IOPAMIDOL (ISOVUE-300) INJECTION 61% COMPARISON:  None. FINDINGS: Lower chest: Lung bases are clear. Esophageal varices. Coronary artery calcifications. Hepatobiliary: Hepatic cirrhosis with enlargement of the lateral segment left and caudate lobes as well as nodular contour. Focal wedge-shaped peripheral low-attenuation lesion in the right lobe of the liver is unchanged since prior studies dating back to 07/12/2009. Long-term stability suggests benign etiology. Gallbladder and bile ducts are unremarkable. Pancreas: Fatty infiltration of the pancreas. Cystic lesion in the tail of the pancreas measuring 1.8 cm diameter. No change since MRI abdomen 05/29/2016. No pancreatic ductal dilatation. Calcifications in the head  of the pancreas consistent with chronic pancreatitis. Infiltration in the fat around the head and body of the pancreas extending around the celiac axis and around the duodenum to the right anterior pararenal space. There is associated duodenal wall thickening. This could represent acute pancreatitis associated with duodenitis or peptic ulcer disease. Scattered peripancreatic lymph nodes are probably reactive. Spleen: Spleen is not significantly enlarged.  No focal lesions. Adrenals/Urinary Tract: No adrenal gland nodules. Stones in both kidneys, largest in the left lower pole measures 13 mm diameter. Cyst in the lower pole left kidney is unchanged. Low-attenuation lesion with calcification in the posterior inferior left kidney is unchanged since prior study and looks postoperative. Could also represent dystrophic calcification. No hydronephrosis or hydroureter.  Cyst in the lower pole left kidney. Bladder is unremarkable. Stomach/Bowel: Wall thickening and inflammatory infiltration around the second portion of the duodenum. Stomach, small bowel, and colon are not abnormally distended. Scattered stool throughout the colon. Appendix is not identified. Vascular/Lymphatic: Scattered calcifications in the abdominal aorta. No aortic aneurysm. No significant lymphadenopathy. Inferior vena caval filter. Reproductive: Prostate gland is diffusely enlarged, measuring 5.3 cm in diameter. Prostate calcifications are present. Other: Postoperative changes consistent with ventral abdominal wall hernia repair. No free air or free fluid in the abdomen. Musculoskeletal: Degenerative changes in the spine. Old compression of L1. Schmorl's nodes at T11 and T12. No change since prior study. Geographic sclerosis in the superior aspect of the right femoral head may represent avascular necrosis. IMPRESSION: 1. Inflammatory infiltration around the head and body of the pancreas and of the second portion the duodenum with duodenal wall  thickening. Changes likely represent acute pancreatitis associated with duodenitis or peptic ulcer disease. 2. Calcifications in the pancreas consistent with chronic pancreatitis. 3. 1.8 cm diameter cystic lesion in the tail of the pancreas is unchanged since prior study. 4. Hepatic cirrhosis with evidence of portal venous hypertension including upper abdominal and esophageal varices. Focal lesion in segment 7 of the liver is unchanged since prior study of 2011 suggesting benign etiology. 5. Aortic atherosclerosis. 6. Bilateral nonobstructing renal stones. 7. Enlarged prostate gland. Electronically Signed   By: Burman Nieves M.D.   On: 11/28/2017 02:52   Dg C-arm 1-60 Min-no Report  Result Date: 11/04/2017 Fluoroscopy was utilized by the requesting physician.  No radiographic interpretation.   US Abdomen Limited Ruq  Result Date: 11/28/2017 CLINICAL DATA:  Acute pancreatitis EXAM: ULTRASOUND ABDOMEN LIMITED RIGHT UPPER QUADRANT COMPARISON:  11/28/2017 CT with contrast FINDINGS: Gallbladder: Negative for gallstones. Gallbladder nondistended likely accounting for slight wall prominence. Wall thickness measures 2.4 mm. No gallstones visualized. No pericholecystic fluid. Common bile duct: Diameter: 1.3 mm Liver: Mild heterogeneous echotexture. Hepatic cirrhosis noted. No biliary dilatation. Nonspecific subcapsular hyperechoic area in the right lobe measures 2.3 x 1.2 x 1.6 cm. This remains indeterminate by ultrasound. Recommend attention on his routine annual MRI follow-up. Portal vein is patent on color Doppler imaging with normal direction of blood flow towards the liver. No ascites IMPRESSION: Negative for gallstones or biliary dilatation Hepatic cirrhosis Indeterminate subcapsular right hepatic lobe 2.3 cm hyperechoic area by ultrasound. Recommend attention on continued surveillance MRI annually. Electronically Signed   By: Judie Petit.  Shick M.D.   On: 11/28/2017 10:32    Microbiology: No results found for  this or any previous visit (from the past 240 hour(s)).   Labs: Basic Metabolic Panel: Recent Labs  Lab 11/28/17 0120 11/29/17 0444  NA 137 138  K 4.4 4.0  CL 104 108  CO2 22 24  GLUCOSE 157* 124*  BUN 13 12  CREATININE 0.98 0.89  CALCIUM 10.2 8.4*   Liver Function Tests: Recent Labs  Lab 11/28/17 0120 11/29/17 0444  AST 41 27  ALT 27 20  ALKPHOS 90 68  BILITOT 1.1 1.1  PROT 7.7 6.2*  ALBUMIN 3.8 2.9*   Recent Labs  Lab 11/28/17 0120 11/28/17 0537 11/29/17 0444  LIPASE 46  --  35  AMYLASE  --  119*  --    No results for input(s): AMMONIA in the last 168 hours. CBC: Recent Labs  Lab 11/28/17 0120 11/29/17 0444  WBC 11.2* 6.0  NEUTROABS 8.6* 4.0  HGB 12.3* 10.6*  HCT 38.4* 33.2*  MCV 95.3 93.5  PLT  196 177   Cardiac Enzymes: No results for input(s): CKTOTAL, CKMB, CKMBINDEX, TROPONINI in the last 168 hours. BNP: BNP (last 3 results) No results for input(s): BNP in the last 8760 hours.  ProBNP (last 3 results) No results for input(s): PROBNP in the last 8760 hours.  CBG: Recent Labs  Lab 11/28/17 2053 11/29/17 0106 11/29/17 0442 11/29/17 0750 11/29/17 1203  GLUCAP 98 106* 110* 133* 107*       Signed:  Lacretia Nicks MD.  [facility] 11/29/2017, 5:18 PM

## 2017-11-29 NOTE — Care Management Obs Status (Signed)
MEDICARE OBSERVATION STATUS NOTIFICATION   Patient Details  Name: Dustin GilfordJackie L Carpenter MRN: [id number] Date of Birth: 08/17/1940   Medicare Observation Status Notification Given:  Yes    Alexis Goodelleele, Irmalee Riemenschneider K, RN 11/29/2017, 12:00 PM

## 2017-11-29 NOTE — Plan of Care (Signed)

## 2017-11-29 NOTE — Plan of Care (Signed)
Patient discharged home. Discharge instructions given to patient and his girlfriend, both verbalized understanding.

## 2017-11-29 NOTE — Progress Notes (Signed)
Patient active with [facility] but can't remember who. Wants to get home then he will call me with the agency. Provided by number for call back

## 2017-11-29 NOTE — Care Management Note (Signed)
Case Management Note  Patient Details  Name: [name] MRN: [id number] Date of Birth: 05-26-1940  Subjective/Objective:  Discharge planning, spoke with patient at bedside. [facility] and treat and RN  Action/Plan: Contacted [facility] for referral, they have accepted.    Expected Discharge Date:  11/29/17               Expected Discharge Plan:  Home w Home Health Services  In-House Referral:  NA  Discharge planning Services  CM Consult  Post Acute Care Choice:  Home Health Choice offered to:  Patient  DME Arranged:  N/A DME Agency:  NA  HH Arranged:  PT, RN, Disease Management HH Agency:  [facility]  Status of Service:  Completed, signed off  If discussed at Long Length of Stay Meetings, dates discussed:    Additional Comments:  Alexis Goodelleele, Bridgitte Felicetti K, RN 11/29/2017, 4:56 PM

## 2017-12-09 ENCOUNTER — Other Ambulatory Visit: Payer: Self-pay | Admitting: Gastroenterology

## 2018-01-18 ENCOUNTER — Encounter (HOSPITAL_COMMUNITY): Payer: Self-pay | Admitting: *Deleted

## 2018-01-18 ENCOUNTER — Other Ambulatory Visit: Payer: Self-pay

## 2018-01-20 ENCOUNTER — Encounter (HOSPITAL_COMMUNITY)
Admission: RE | Disposition: A | Discharge status: Home / Self Care | Payer: Self-pay | Source: Home / Self Care | Attending: Gastroenterology

## 2018-01-20 ENCOUNTER — Ambulatory Visit (HOSPITAL_COMMUNITY): Payer: Medicare Other | Admitting: Anesthesiology

## 2018-01-20 ENCOUNTER — Ambulatory Visit (HOSPITAL_COMMUNITY)
Admission: RE | Admit: 2018-01-20 | Discharge: 2018-01-20 | Disposition: A | Discharge status: Home / Self Care | Payer: Medicare Other | Attending: Gastroenterology | Admitting: Gastroenterology

## 2018-01-20 ENCOUNTER — Other Ambulatory Visit: Payer: Self-pay

## 2018-01-20 ENCOUNTER — Encounter (HOSPITAL_COMMUNITY): Payer: Self-pay | Admitting: *Deleted

## 2018-01-20 DIAGNOSIS — E785 Hyperlipidemia, unspecified: Secondary | ICD-10-CM | POA: Diagnosis not present

## 2018-01-20 DIAGNOSIS — K766 Portal hypertension: Secondary | ICD-10-CM | POA: Insufficient documentation

## 2018-01-20 DIAGNOSIS — K2950 Unspecified chronic gastritis without bleeding: Secondary | ICD-10-CM | POA: Diagnosis not present

## 2018-01-20 DIAGNOSIS — I10 Essential (primary) hypertension: Secondary | ICD-10-CM | POA: Diagnosis not present

## 2018-01-20 DIAGNOSIS — Z86711 Personal history of pulmonary embolism: Secondary | ICD-10-CM | POA: Diagnosis not present

## 2018-01-20 DIAGNOSIS — K3189 Other diseases of stomach and duodenum: Secondary | ICD-10-CM | POA: Diagnosis not present

## 2018-01-20 DIAGNOSIS — Z86718 Personal history of other venous thrombosis and embolism: Secondary | ICD-10-CM | POA: Diagnosis not present

## 2018-01-20 DIAGNOSIS — I8510 Secondary esophageal varices without bleeding: Secondary | ICD-10-CM | POA: Insufficient documentation

## 2018-01-20 DIAGNOSIS — Z7901 Long term (current) use of anticoagulants: Secondary | ICD-10-CM | POA: Insufficient documentation

## 2018-01-20 DIAGNOSIS — E119 Type 2 diabetes mellitus without complications: Secondary | ICD-10-CM | POA: Diagnosis not present

## 2018-01-20 DIAGNOSIS — Z8673 Personal history of transient ischemic attack (TIA), and cerebral infarction without residual deficits: Secondary | ICD-10-CM | POA: Insufficient documentation

## 2018-01-20 DIAGNOSIS — K7460 Unspecified cirrhosis of liver: Secondary | ICD-10-CM | POA: Insufficient documentation

## 2018-01-20 DIAGNOSIS — Z7984 Long term (current) use of oral hypoglycemic drugs: Secondary | ICD-10-CM | POA: Diagnosis not present

## 2018-01-20 DIAGNOSIS — G4700 Insomnia, unspecified: Secondary | ICD-10-CM | POA: Diagnosis not present

## 2018-01-20 DIAGNOSIS — Z79899 Other long term (current) drug therapy: Secondary | ICD-10-CM | POA: Diagnosis not present

## 2018-01-20 HISTORY — PX: BIOPSY: SHX5522

## 2018-01-20 HISTORY — PX: ESOPHAGEAL BANDING: SHX5518

## 2018-01-20 HISTORY — PX: ESOPHAGOGASTRODUODENOSCOPY (EGD) WITH PROPOFOL: SHX5813

## 2018-01-20 LAB — GLUCOSE, CAPILLARY
Glucose-Capillary: 180 mg/dL — ABNORMAL HIGH (ref 70–99)
Glucose-Capillary: 242 mg/dL — ABNORMAL HIGH (ref 70–99)

## 2018-01-20 SURGERY — ESOPHAGOGASTRODUODENOSCOPY (EGD) WITH PROPOFOL
Anesthesia: Monitor Anesthesia Care

## 2018-01-20 MED ORDER — LACTATED RINGERS IV SOLN
INTRAVENOUS | Status: DC
  Administered 2018-01-20: 1000 mL via INTRAVENOUS

## 2018-01-20 MED ORDER — PROPOFOL 500 MG/50ML IV EMUL
INTRAVENOUS | Status: DC | PRN
  Administered 2018-01-20: 100 ug/kg/min via INTRAVENOUS

## 2018-01-20 MED ORDER — LIDOCAINE HCL (CARDIAC) PF 100 MG/5ML IV SOSY
PREFILLED_SYRINGE | INTRAVENOUS | Status: DC | PRN
  Administered 2018-01-20: 100 mg via INTRAVENOUS

## 2018-01-20 MED ORDER — METFORMIN HCL 500 MG PO TABS: 1000.0000 mg | ORAL_TABLET | Freq: Once | ORAL | Status: DC

## 2018-01-20 MED ORDER — SODIUM CHLORIDE 0.9 % IV SOLN: INTRAVENOUS | Status: DC

## 2018-01-20 MED ORDER — METFORMIN HCL ER 500 MG PO TB24
1000.0000 mg | ORAL_TABLET | Freq: Once | ORAL | Status: AC
  Administered 2018-01-20: 1000 mg via ORAL
  Filled 2018-01-20: qty 2

## 2018-01-20 MED ORDER — PROPOFOL 10 MG/ML IV BOLUS
INTRAVENOUS | Status: AC
  Filled 2018-01-20: qty 60

## 2018-01-20 MED ORDER — LACTATED RINGERS IV SOLN
INTRAVENOUS | Status: DC | PRN
  Administered 2018-01-20: 08:00:00 via INTRAVENOUS

## 2018-01-20 SURGICAL SUPPLY — 15 items

## 2018-01-20 NOTE — H&P (Signed)
Primary Care Physician:  Daisy Florooss, Charles Alan, MD Primary Gastroenterologist:  Dr. Levora AngelBrahmbhatt  Reason for Visit : Cirrhosis  HPI: [name] is a 78 y.o. male  With PMH of DVT, PE on coumadin , other medical conditions as mentioned below was recently diagnosed with cirrhosis. Here for EGD to screen for esophageal varices and possible band ligation . No acute GI symptoms. Coumadin on hold for 5 days.   Past Medical History:  Diagnosis Date  . Cirrhosis (HCC)    noted on CT 05/28/16  . Closed compression fracture of L1 lumbar vertebra 05/2016  . Diabetes mellitus    type 2  . Diverticulitis   . DVT (deep venous thrombosis) (HCC) 2002  . Dyspnea    with exertion  . History of kidney stones   . Hyperlipidemia   . Hypertension   . Insomnia   . Kidney stones    hx of  . Liver mass, right lobe    noted on CT 05/28/16  . Pancreatic cyst    noted on CT 05/28/16  . Perforated diverticulum   . Presence of inferior vena cava filter 2002  . Pulmonary embolism (HCC) 2002  . Secondary hypercoagulability disorder (HCC)   . Status post placement of ureteral stent:R 10/15/2017 10/26/2017  . Stroke [facility](HCC) 2002   right eye no peripheral vision, short term memory loss    Past Surgical History:  Procedure Laterality Date  . APPENDECTOMY  1949  . BACK SURGERY     lower  . CARPAL TUNNEL RELEASE  2002 both wrists  . COLON SURGERY  07/19/2009   sigmoid colectomy   . CYSTOSCOPY WITH BIOPSY N/A 01/26/2017   Procedure: CYSTOSCOPY;  Surgeon: Bjorn PippinWrenn, John, MD;  [facility] [place];  Service: Urology;  Laterality: N/A;  . CYSTOSCOPY WITH STENT PLACEMENT Right 10/15/2017   Procedure: CYSTOSCOPY WITH STENT PLACEMENT;  Surgeon: Heloise PurpuraBorden, Lester, MD;  Location: [facility];  Service: Urology;  Laterality: Right;  . CYSTOSCOPY/URETEROSCOPY/HOLMIUM LASER/STENT PLACEMENT Right 11/04/2017   Procedure: CYSTOSCOPY/RIGHT URETEROSCOPY/HOLMIUM LASER/STENT EXCHANGE;  Surgeon: Bjorn PippinWrenn, John, MD;  Location:  [facility];  Service: Urology;  Laterality: Right;  . HERNIA REPAIR   1983, 1983, 02/03/2010   lap ventral hernia repair  . KIDNEY STONE SURGERY  1977, 1983  . LITHOTRIPSY  2010   bleeding in kidney after  . surgery for diverticulosis  2011  . TEE WITHOUT CARDIOVERSION N/A 11/03/2017   Procedure: TRANSESOPHAGEAL ECHOCARDIOGRAM (TEE);  Surgeon: Jodelle Redhristopher, Bridgette, MD;  Location[facility];  Service: Cardiovascular;  Laterality: N/A;  . TONSILLECTOMY  1947  . TONSILLECTOMY AND ADENOIDECTOMY    . VENTRAL HERNIA REPAIR  09/11/2011   Procedure: LAPAROSCOPIC VENTRAL HERNIA;  Surgeon: Kandis Cockingavid H Newman, MD;  Location: [facility];  Service: General;  Laterality: N/A;  Lapaaroscopic Repair Ventral Incisional Hernias x 2    Prior to Admission medications   Medication Sig Start Date End Date Taking? Authorizing Provider  B Complex-C (B-COMPLEX WITH VITAMIN C) tablet Take 1 tablet by mouth daily.   Yes [provider]  ferrous sulfate 325 (65 FE) MG tablet Take 325 mg by mouth daily with breakfast.   Yes [provider]  glipiZIDE (GLUCOTROL XL) 2.5 MG 24 hr tablet Take 2.5 mg by mouth every evening.  10/18/17  Yes [provider]  losartan (COZAAR) 100 MG tablet Take 100 mg by mouth every evening.    Yes [provider]  metFORMIN (GLUCOPHAGE-XR) 500 MG 24 hr tablet Take 1,000 mg by  mouth 2 (two) times daily. 10/16/17  Yes [provider]  ondansetron (ZOFRAN) 4 MG tablet Take 1 tablet (4 mg total) by mouth every 8 (eight) hours as needed for nausea or vomiting. 10/15/17  Yes Heloise Purpura, MD  pantoprazole (PROTONIX) 40 MG tablet Take 1 tablet (40 mg total) by mouth daily. 11/29/17 01/15/19 Yes Zigmund Daniel., MD  simvastatin (ZOCOR) 40 MG tablet Take 40 mg by mouth every evening.    Yes [provider]  warfarin (COUMADIN) 2 MG tablet Take 3-4 mg by mouth See admin instructions. Take 2 tablets (4 mg) by mouth on Mondays & Fridays, then take 1.5  tablets (3 mg) by mouth on all other days.   Yes [provider]    Scheduled Meds: Continuous Infusions: . sodium chloride    . sodium chloride    . lactated ringers 1,000 mL (01/20/18 0808)   PRN Meds:.  Allergies as of 12/09/2017 - Review Complete 11/28/2017  Allergen Reaction Noted  . Prednisone  01/29/2016  . Zithromax [azithromycin] Other (See Comments) 08/27/2011    Family History  Problem Relation Age of Onset  . Lung disease Father   . Cancer Neg Hx     Social History   Socioeconomic History  . Marital status: Divorced    Spouse name: Not on file  . Number of children: Not on file  . Years of education: Not on file  . Highest education level: Not on file  Occupational History  . Not on file  Social Needs  . Financial resource strain: Not on file  . Food insecurity:    Worry: Not on file    Inability: Not on file  . Transportation needs:    Medical: Not on file    Non-medical: Not on file  Tobacco Use  . Smoking status: Never Smoker  . Smokeless tobacco: Never Used  Substance and Sexual Activity  . Alcohol use: No  . Drug use: No  . Sexual activity: Not on file  Lifestyle  . Physical activity:    Days per week: Not on file    Minutes per session: Not on file  . Stress: Not on file  Relationships  . Social connections:    Talks on phone: Not on file    Gets together: Not on file    Attends religious service: Not on file    Active member of club or organization: Not on file    Attends meetings of clubs or organizations: Not on file    Relationship status: Not on file  . Intimate partner violence:    Fear of current or ex partner: Not on file    Emotionally abused: Not on file    Physically abused: Not on file    Forced sexual activity: Not on file  Other Topics Concern  . Not on file  Social History Narrative  . Not on file    Review of Systems: All negative except as stated above in HPI.  Physical Exam: Vital signs: Vitals:    01/20/18 0758  BP: (!) 148/85  Pulse: 88  Resp: (!) 23  Temp: 98 F (36.7 C)  SpO2: 98%     General:   Alert,  Well-developed, well-nourished, pleasant and cooperative in NAD Lungs:  Clear throughout to auscultation.   No wheezes, crackles, or rhonchi. No acute distress. Heart:  Regular rate and rhythm; no murmurs, clicks, rubs,  or gallops. Abdomen:  Mild distended, NT, BS + ,  Rectal:  Deferred  GI:  Lab Results: No results for input(s): WBC, HGB, HCT, PLT in the last 72 hours. BMET No results for input(s): NA, K, CL, CO2, GLUCOSE, BUN, CREATININE, CALCIUM in the last 72 hours. LFT No results for input(s): PROT, ALBUMIN, AST, ALT, ALKPHOS, BILITOT, BILIDIR, IBILI in the last 72 hours. PT/INR No results for input(s): LABPROT, INR in the last 72 hours.   Studies/Results: No results found.  Impression/Plan:  Cirrhosis  DVT and PE - Coumadin on hold   PLAN -------- - EGD with possible band ligation today.   Risks (bleeding, infection, bowel perforation that could require surgery, sedation-related changes in cardiopulmonary systems), benefits (identification and possible treatment of source of symptoms, exclusion of certain causes of symptoms), and alternatives (watchful waiting, radiographic imaging studies, empiric medical treatment)  were explained to patient in detail and patient wishes to proceed.    LOS: 0 days   Kathi Der  MD, FACP 01/20/2018, 8:12 AM  Contact #  [phone]

## 2018-01-20 NOTE — Anesthesia Postprocedure Evaluation (Signed)
Anesthesia Post Note  Patient: [name]  Procedure(s) Performed: ESOPHAGOGASTRODUODENOSCOPY (EGD) WITH PROPOFOL (N/A ) ESOPHAGEAL BANDING (N/A )     Patient location during evaluation: PACU Anesthesia Type: MAC Level of consciousness: awake and alert Pain management: pain level controlled Vital Signs Assessment: post-procedure vital signs reviewed and stable Respiratory status: spontaneous breathing, nonlabored ventilation, respiratory function stable and patient connected to nasal cannula oxygen Cardiovascular status: stable and blood pressure returned to baseline Postop Assessment: no apparent nausea or vomiting Anesthetic complications: no    Last Vitals:  Vitals:   01/20/18 0920 01/20/18 0930  BP: (!) 153/79 (!) 163/87  Pulse: 84 78  Resp: 18 16  Temp:    SpO2: 98% 100%    Last Pain:  Vitals:   01/20/18 0903  TempSrc: Oral  PainSc:                  [name] P Taesean Reth

## 2018-01-20 NOTE — Op Note (Addendum)
[facility] Patient Name: Dustin AugustaJackie Lavergne Procedure Date: 01/20/2018 MRN: [id number] Attending MD: Kathi DerParag Audriana Aldama , MD Date of Birth: July 12, 1940 CSN: [id number] Age: [age] Admit Type: Outpatient Procedure:                Upper GI endoscopy Indications:              Cirrhosis rule out esophageal varices Providers:                Kathi DerParag Prachi Oftedahl, MD, Jacquiline DoeJennifer Zhu, RN, Arlee Muslimhris                            Chandler Tech., Technician, Phillips GroutJoanne Gray, CRNA Referring MD:              Medicines:                Sedation Administered by an Anesthesia Professional Complications:            No immediate complications. Estimated Blood Loss:     Estimated blood loss was minimal. Procedure:                Pre-Anesthesia Assessment:                           - Prior to the procedure, a History and Physical                            was performed, and patient medications and                            allergies were reviewed. The patient's tolerance of                            previous anesthesia was also reviewed. The risks                            and benefits of the procedure and the sedation                            options and risks were discussed with the patient.                            All questions were answered, and informed consent                            was obtained. Prior Anticoagulants: The patient has                            taken Coumadin (warfarin), last dose was 6 days                            prior to procedure. ASA Grade Assessment: IV - A                            patient with severe systemic disease that is a  constant threat to life. After reviewing the risks                            and benefits, the patient was deemed in                            satisfactory condition to undergo the procedure.                           After obtaining informed consent, the endoscope was                            passed under direct  vision. Throughout the                            procedure, the patient's blood pressure, pulse, and                            oxygen saturations were monitored continuously. The                            GIF-H190 ([id number] Olympus gastroscope was                            introduced through the mouth, and advanced to the                            second part of duodenum. The upper GI endoscopy was                            accomplished without difficulty. The patient                            tolerated the procedure well. Scope In: Scope Out: Findings:      Two columns of non-bleeding large (> 5 mm) varices were found in the       distal esophagus,. No stigmata of recent bleeding were evident and no       red wale signs were present. Four bands were successfully placed with       complete eradication, resulting in deflation of varices. There was no       bleeding during and at the end of the procedure.      Severe portal hypertensive gastropathy was found in the gastric body.       Biopsies were taken with a cold forceps for histology.      The cardia and gastric fundus were normal on retroflexion.      Diffuse moderately erythematous mucosa was found in the duodenal bulb.      The first portion of the duodenum and second portion of the duodenum       were normal. Impression:               - Non-bleeding large (> 5 mm) esophageal varices.                            Completely eradicated. Banded.                           -  Portal hypertensive gastropathy. Biopsied.                           - Erythematous duodenopathy.                           - Normal first portion of the duodenum and second                            portion of the duodenum. Moderate Sedation:      Moderate (conscious) sedation was personally administered by an       anesthesia professional. The following parameters were monitored: oxygen       saturation, heart rate, blood pressure, and response to  care. Recommendation:           - Patient has a contact number available for                            emergencies. The signs and symptoms of potential                            delayed complications were discussed with the                            patient. Return to normal activities tomorrow.                            Written discharge instructions were provided to the                            patient.                           - Resume previous diet.                           - Continue present medications.                           - Repeat upper endoscopy in 2 months for                            retreatment.                           - Return to my office as previously scheduled.                           - Resume Coumadin (warfarin) at prior dose in 3                            days. Procedure Code(s):        --- Professional ---                           [phone], Esophagogastroduodenoscopy, flexible,  transoral; with band ligation of esophageal/gastric                            varices                           43239, Esophagogastroduodenoscopy, flexible,                            transoral; with biopsy, single or multiple Diagnosis Code(s):        --- Professional ---                           K74.60, Unspecified cirrhosis of liver                           I85.10, Secondary esophageal varices without                            bleeding                           K76.6, Portal hypertension                           K31.89, Other diseases of stomach and duodenum CPT copyright 2018 [employer] Association. All rights reserved. The codes documented in this report are preliminary and upon coder review may  be revised to meet current compliance requirements. Kathi Der, MD Kathi Der, MD 01/20/2018 9:00:51 AM Number of Addenda: 0

## 2018-01-20 NOTE — Anesthesia Preprocedure Evaluation (Addendum)
Anesthesia Evaluation  Patient identified by MRN, date of birth, ID band Patient awake    Reviewed: Allergy & Precautions, NPO status , Patient's Chart, lab work & pertinent test results  Airway Mallampati: II  TM Distance: >3 FB Neck ROM: Full    Dental no notable dental hx.    Pulmonary neg pulmonary ROS,    Pulmonary exam normal breath sounds clear to auscultation       Cardiovascular hypertension, Pt. on medications + DVT  Normal cardiovascular exam Rhythm:Regular Rate:Normal  ECG: rate 101, Sinus tachycardia Left axis deviation. Right bundle branch block  ECHO: LV EF: 60% - 65%   Neuro/Psych Right eye no peripheral vision, short term memory loss CVA, Residual Symptoms negative psych ROS   GI/Hepatic GERD  Medicated and Controlled,(+) Cirrhosis       ,   Endo/Other  diabetes, Oral Hypoglycemic Agents  Renal/GU negative Renal ROS     Musculoskeletal negative musculoskeletal ROS (+)   Abdominal   Peds  Hematology HLD Elevated INR   Anesthesia Other Findings Cirrhosis of the liver  Reproductive/Obstetrics                            Anesthesia Physical Anesthesia Plan  ASA: IV  Anesthesia Plan: MAC   Post-op Pain Management:    Induction:   PONV Risk Score and Plan: 1 and Propofol infusion and Treatment may vary due to age or medical condition  Airway Management Planned: Nasal Cannula  Additional Equipment:   Intra-op Plan:   Post-operative Plan:   Informed Consent: I have reviewed the patients History and Physical, chart, labs and discussed the procedure including the risks, benefits and alternatives for the proposed anesthesia with the patient or authorized representative who has indicated his/her understanding and acceptance.     Dental advisory given  Plan Discussed with: CRNA  Anesthesia Plan Comments:        Anesthesia Quick Evaluation

## 2018-01-20 NOTE — Discharge Instructions (Signed)
Resume coumadin on Saturday, January 18 if no further bleeding. Call MD with any further questions.    YOU HAD AN ENDOSCOPIC PROCEDURE TODAY: Refer to the procedure report and other information in the discharge instructions given to you for any specific questions about what was found during the examination. If this information does not answer your questions, please call [facility] office at [phone] to clarify.   YOU SHOULD EXPECT: Some feelings of bloating in the abdomen. Passage of more gas than usual. Walking can help get rid of the air that was put into your GI tract during the procedure and reduce the bloating. If you had a lower endoscopy (such as a colonoscopy or flexible sigmoidoscopy) you may notice spotting of blood in your stool or on the toilet paper. Some abdominal soreness may be present for a day or two, also.  DIET: Your first meal following the procedure should be a light meal and then it is ok to progress to your normal diet. A half-sandwich or bowl of soup is an example of a good first meal. Heavy or fried foods are harder to digest and may make you feel nauseous or bloated. Drink plenty of fluids but you should avoid alcoholic beverages for 24 hours. If you had a esophageal dilation, please see attached instructions for diet.   ACTIVITY: Your care partner should take you home directly after the procedure. You should plan to take it easy, moving slowly for the rest of the day. You can resume normal activity the day after the procedure however YOU SHOULD NOT DRIVE, use power tools, machinery or perform tasks that involve climbing or major physical exertion for 24 hours (because of the sedation medicines used during the test).   SYMPTOMS TO REPORT IMMEDIATELY: A gastroenterologist can be reached at any hour. Please call [phone]  for any of the following symptoms:    Following upper endoscopy (EGD, EUS, ERCP, esophageal dilation) Vomiting of blood or coffee ground material    New, significant abdominal pain  New, significant chest pain or pain under the shoulder blades  Painful or persistently difficult swallowing  New shortness of breath  Black, tarry-looking or red, bloody stools  FOLLOW UP:  If any biopsies were taken you will be contacted by phone or by letter within the next 1-3 weeks. Call [phone]  if you have not heard about the biopsies in 3 weeks.  Please also call with any specific questions about appointments or follow up tests.

## 2018-01-20 NOTE — Transfer of Care (Signed)
Immediate Anesthesia Transfer of Care Note  Patient: [name]  Procedure(s) Performed: ESOPHAGOGASTRODUODENOSCOPY (EGD) WITH PROPOFOL (N/A ) ESOPHAGEAL BANDING (N/A )  Patient Location: PACU and Endoscopy Unit  Anesthesia Type:MAC  Level of Consciousness: awake, oriented, drowsy and patient cooperative  Airway & Oxygen Therapy: Patient Spontanous Breathing and Patient connected to nasal cannula oxygen  Post-op Assessment: Report given to RN, Post -op Vital signs reviewed and stable and Patient moving all extremities  Post vital signs: Reviewed and stable  Last Vitals:  Vitals Value Taken Time  BP 168/90 01/20/2018  9:00 AM  Temp    Pulse 95 01/20/2018  9:01 AM  Resp 26 01/20/2018  9:01 AM  SpO2 100 % 01/20/2018  9:01 AM  Vitals shown include unvalidated device data.  Last Pain:  Vitals:   01/20/18 0758  TempSrc: Oral  PainSc: 0-No pain         Complications: No apparent anesthesia complications

## 2018-01-24 ENCOUNTER — Encounter (HOSPITAL_COMMUNITY): Payer: Self-pay | Admitting: Gastroenterology

## 2018-05-25 IMAGING — MR MR ABDOMEN WO/W CM
11 of 17 series · 21 of 48 positions shown · IV contrast (Y MH)
Comparison: No prior abdominal MRI.  Abdominal CT scan 05/28/2016.

CLINICAL DATA: 75-year-old male with history of diabetes,
hyperlipidemia and hypertension with history of trauma from a recent
fall off of a ladder. L1 vertebral compression fracture.
Indeterminate liver lesion noted on prior CT examination.

EXAM:
MRI ABDOMEN WITHOUT AND WITH CONTRAST
TECHNIQUE: Multiplanar multisequence MR imaging of the abdomen was performed
both before and after the administration of intravenous contrast.
CONTRAST:  15mL MULTIHANCE GADOBENATE DIMEGLUMINE 529 MG/ML IV SOLN

[Series 3: cor ssfse nav · coronal · 6.0mm · 0.78mm/px · 1 of 38 slices shown]
[im 1/38]
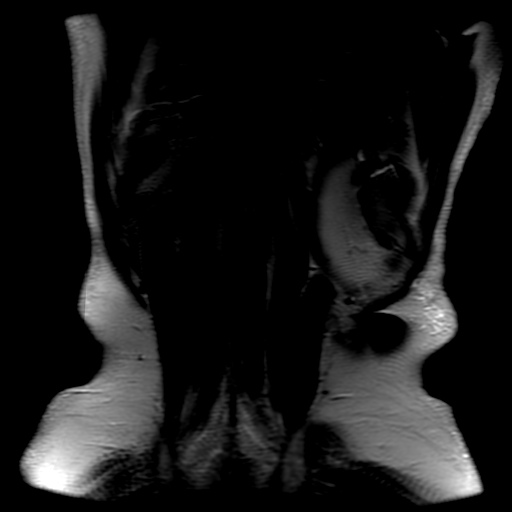

[Series 5: ax ssfse nav · axial · 6.0mm · 0.74mm/px · 1 of 48 slices shown]
[im 1/48]
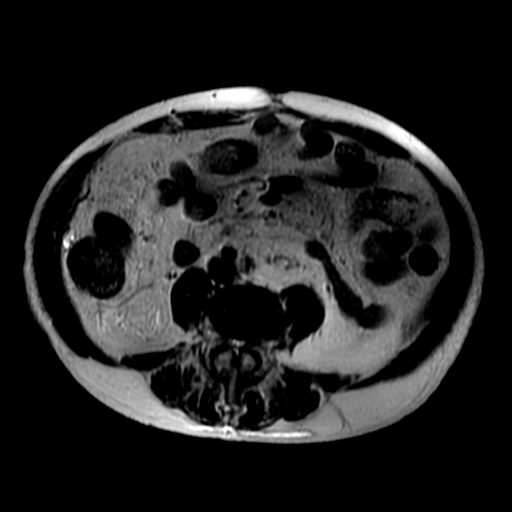

[Series 6: DWI b500 · axial · 8.0mm · 1.95mm/px · 1 of 60 slices shown]
[im 1/60]
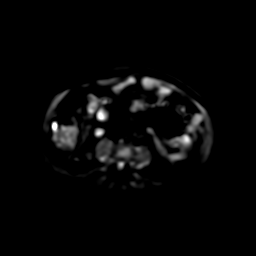

[Series 7: T2 fat-sat · axial · 6.2mm · 0.78mm/px · 1 of 39 slices shown]
[im 1/39]
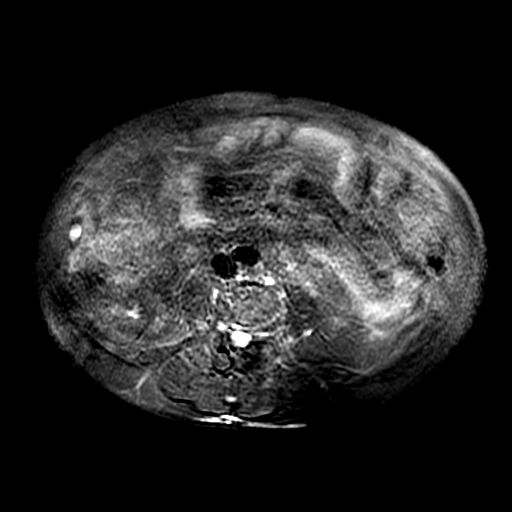

[Series 8: ax ssfse fs · axial · 6.0mm · 0.74mm/px · 1 of 48 slices shown]
[im 1/48]
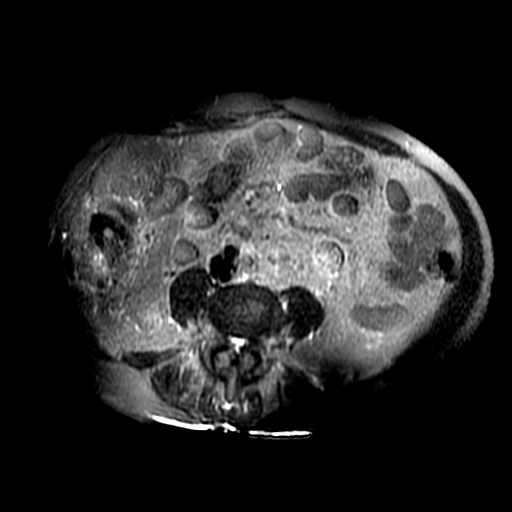

[Series 11: T1 dynamic · coronal · 3.4mm · 1.56mm/px · 4 of 136 slices shown (1 of 3)]
[im 1/136]
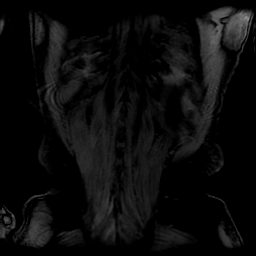
[im 46/136]
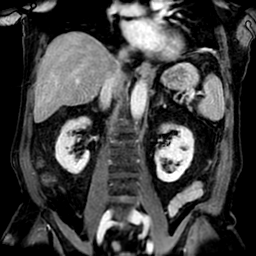
[im 91/136]
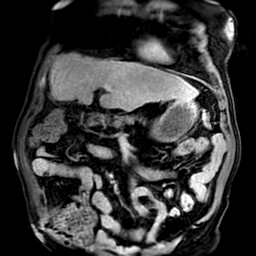
[im 136/136]
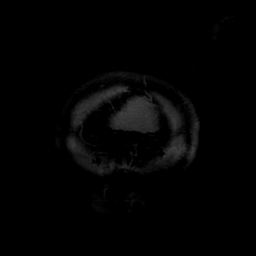

[Series 650: ADC · axial · 8.0mm · 1.95mm/px · 1 of 30 slices shown]
[im 1/30]
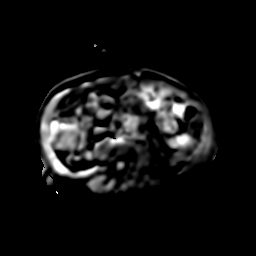

[Series 901: T1 dynamic · axial · 5.0mm · 0.82mm/px · z∈[-65,+213]mm · 3 of 112 slices shown (2 of 3)]
[im 1/112]
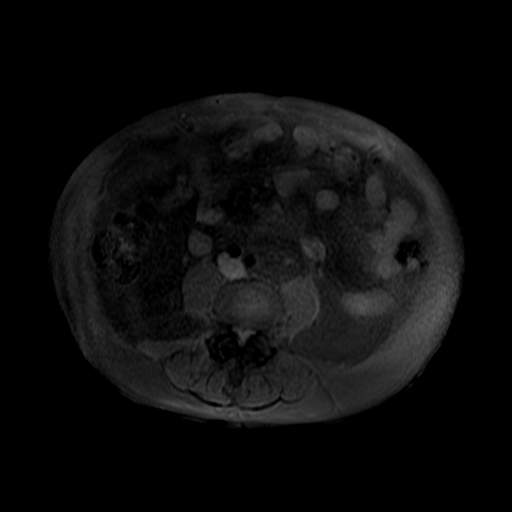
[im 56/112]
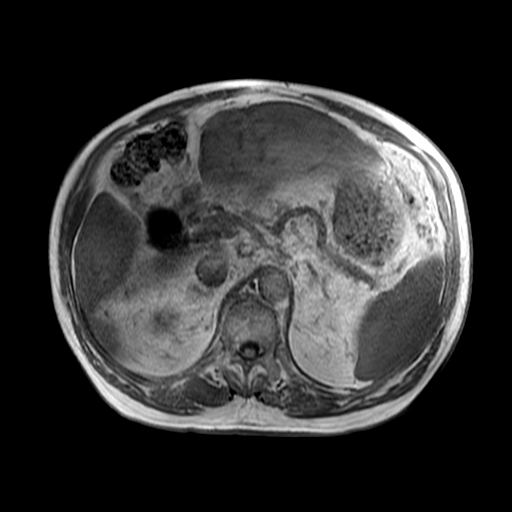
[im 112/112]
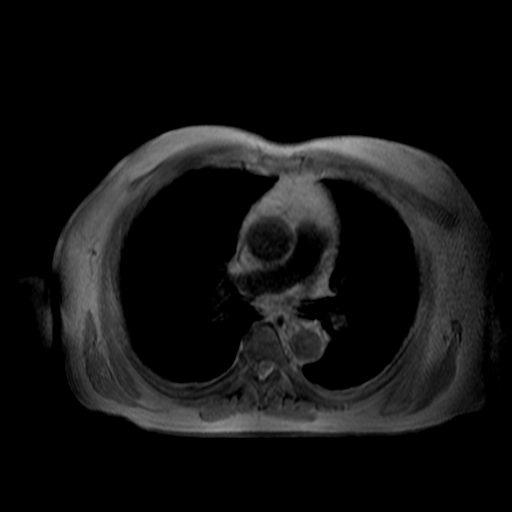

[Series 902: T1 dynamic · axial · 5.0mm · 0.82mm/px · z∈[-65,+213]mm · 3 of 112 slices shown (3 of 3)]
[im 1/112]
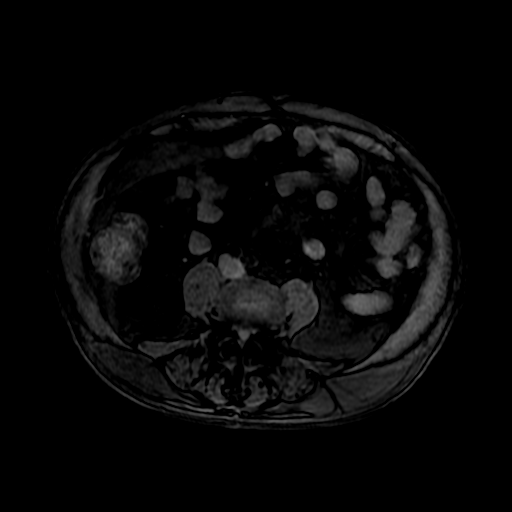
[im 56/112]
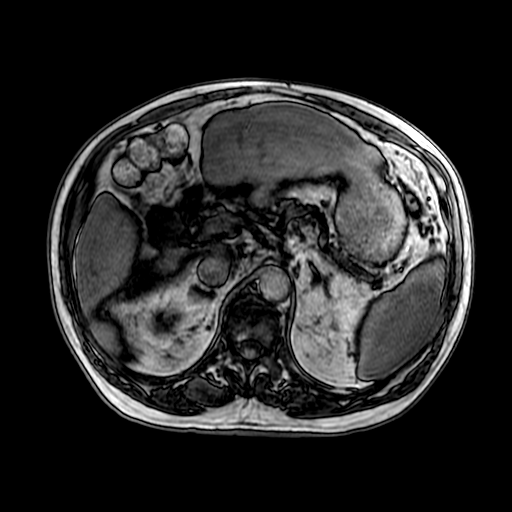
[im 112/112]
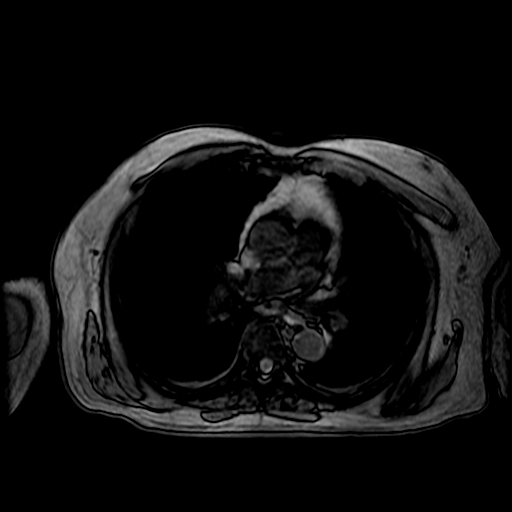

[Series 1001: T1 dynamic post-contrast · axial · non-contrast · 4.0mm · 0.86mm/px · z∈[-65,+213]mm · 4 of 140 slices shown (1 of 2)]
[im 1/140]
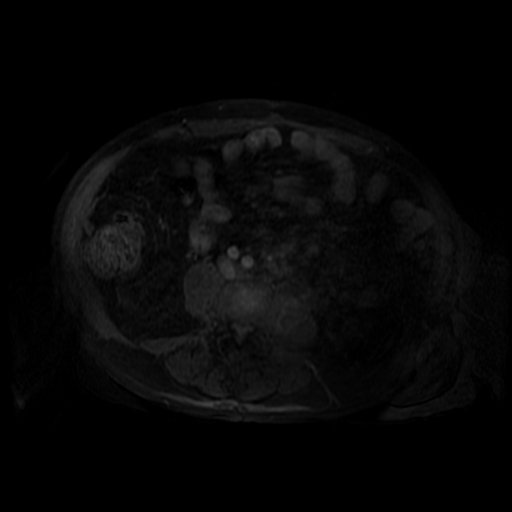
[im 47/140]
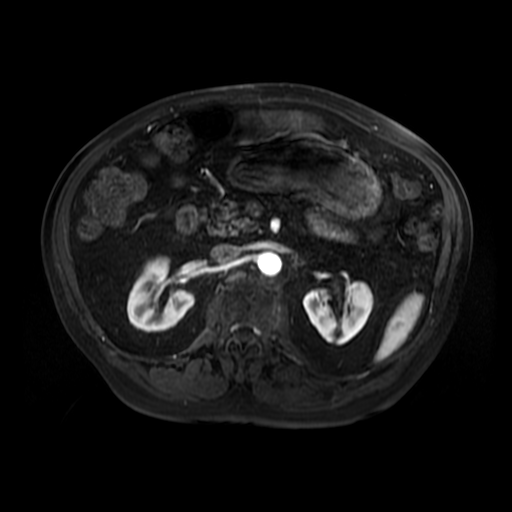
[im 93/140]
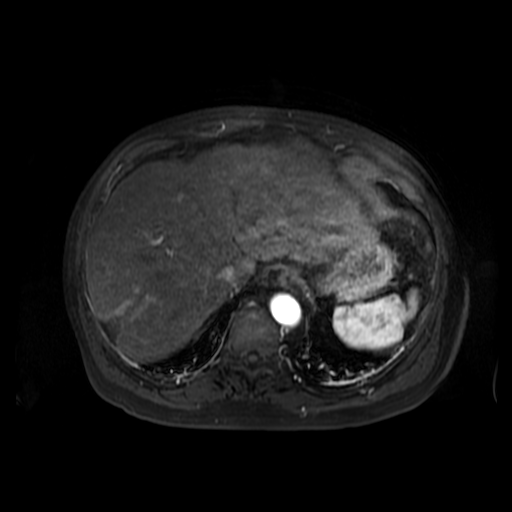
[im 140/140]
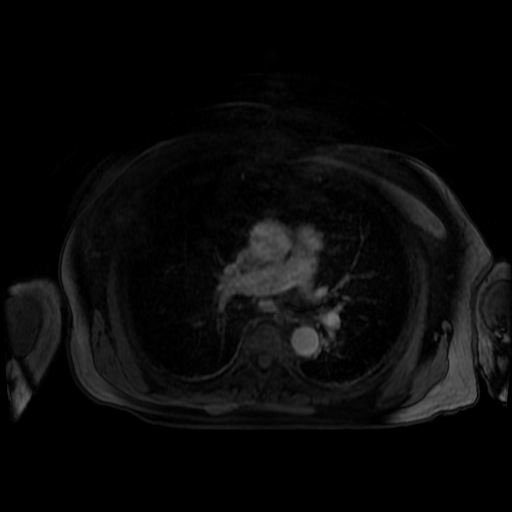

[Series 1002: T1 dynamic post-contrast · axial · non-contrast · 4.0mm · 0.86mm/px · 1 of 140 slices shown (2 of 2)]
[im 1/140]
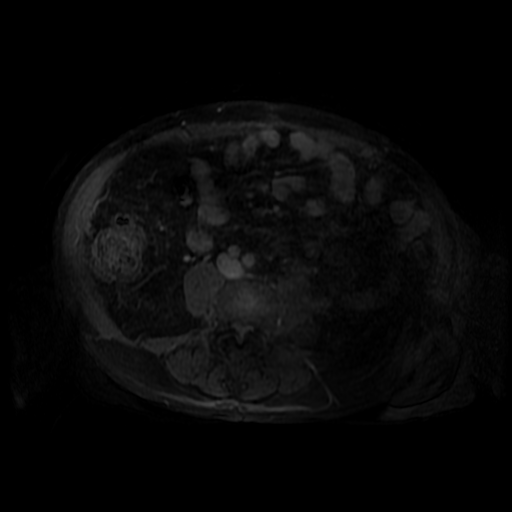

[21 of 48 positions shown; findings below may reference images not displayed]

FINDINGS: Lower chest: Unremarkable.

Hepatobiliary: Liver has a shrunken appearance and nodular contour
with hypertrophy of the caudate lobe, indicative of underlying
cirrhosis. In the lateral aspect of segment 7 of the liver (image 13
of series 5) there is a 2.2 x 2.3 cm lesion that is T1 hypointense,
slightly T2 hyperintense, does not restrict diffusion, is
hypovascular, but demonstrates progressive delayed enhancement
(without characteristic early peripheral nodular enhancement and
centripetal filling as seen with most hemangiomas). 8 mm T1
hypointense, T2 hyperintense, nonenhancing lesion in the inferior
aspect of segment 3 of the liver, compatible with a tiny simple
cysts. No other suspicious hepatic lesions are noted. No intra or
extrahepatic biliary ductal dilatation. Gallbladder is normal in
appearance.

Pancreas: In the tail of the pancreas there is a 1.3 x 1.0 cm
well-defined T1 hypointense, T2 hyperintense lesion which does not
restrict diffusion, and demonstrates no internal enhancement on post
gadolinium imaging, favored to represent a tiny pancreatic
pseudocyst. No other larger more suspicious appearing pancreatic
mass. No pancreatic ductal dilatation. No pancreatic or
peripancreatic fluid or inflammatory changes.

Spleen:  Unremarkable.

Adrenals/Urinary Tract: 3.1 cm exophytic T1 hypointense, T2
hyperintense, nonenhancing lesion in the lower pole of the left
kidney compatible with a simple cyst. No other suspicious renal
lesions. No hydroureteronephrosis in the visualized abdomen.
Bilateral adrenal glands are normal in appearance.

Stomach/Bowel: Visualized portions are unremarkable.

Vascular/Lymphatic: No aneurysm identified in the visualized
abdominal vasculature. Portal vein is mildly dilated measuring 16 mm
in diameter, suggesting underlying portal hypertension. Several
small distal esophageal varices are noted. IVC filter in position
with tip terminating immediately below the level of the renal veins.
No lymphadenopathy noted in the abdomen.

Other: No significant volume of ascites in the visualized portions
of the peritoneal cavity.

Musculoskeletal: No aggressive osseous lesions are noted in the
visualized portions of the skeleton. Compression fracture of
superior endplate of L1 again noted, with very mild compression on
the left side (approximately 10%).
IMPRESSION: 1. The lesion of concern in segment 7 of the liver has indeterminate
imaging characteristics. Overall, this lesion is favored to be
benign, likely an atypical hemangioma, however, a repeat MRI of the
abdomen with and without IV gadolinium is recommended in 3 months to
ensure the stability of this lesion.
2. Stigmata of cirrhosis redemonstrated. Mild dilatation of the
portal vein, indicative of portal venous hypertension. Small distal
esophageal varices are also noted.
3. 1.3 x 1.0 cm cystic lesion in the tail of the pancreas. This is
nonspecific, but strongly favored to be benign. Repeat abdominal MRI
with and without IV gadolinium with MRCP is recommended in 2 years
to ensure the stability or resolution of this finding. This
recommendation follows ACR consensus guidelines: Management of
Incidental Pancreatic Cysts: A White Paper of the ACR Incidental
Findings Committee. [HOSPITAL] 9844;[DATE].
4. Additional incidental findings, as above.

## 2018-05-25 IMAGING — CT CT HEAD W/O CM
5 of 8 series · 16 of 47 positions shown, 17 images · non-contrast
Comparison: None.

CLINICAL DATA: Status post fall backwards off 2-step ladder, with
concern for head or cervical spine injury. Initial encounter.

EXAM:
CT HEAD WITHOUT CONTRAST
CT CERVICAL SPINE WITHOUT CONTRAST
TECHNIQUE: Multidetector CT imaging of the head and cervical spine was
performed following the standard protocol without intravenous
contrast. Multiplanar CT image reconstructions of the cervical spine
were also generated.

[Series 4: head without · axial · non-contrast · 0.45mm/px · z∈[+1148,+1293]mm · 3 of 30 slices shown, 4 images]
[im 1/30  brain]
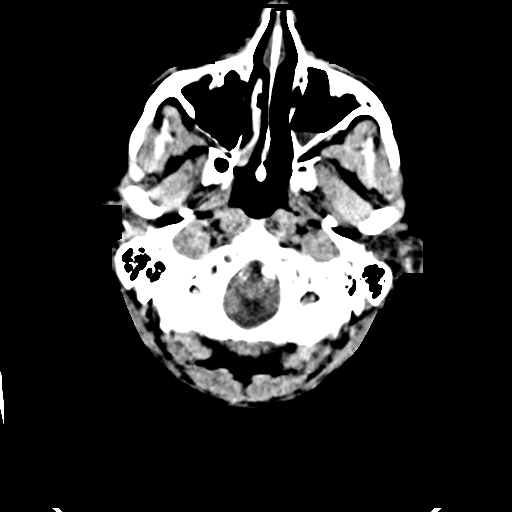
[im 1/30  bone]
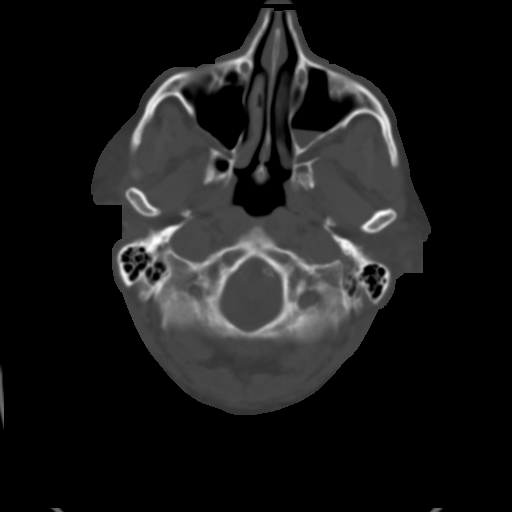
[im 15/30  brain]
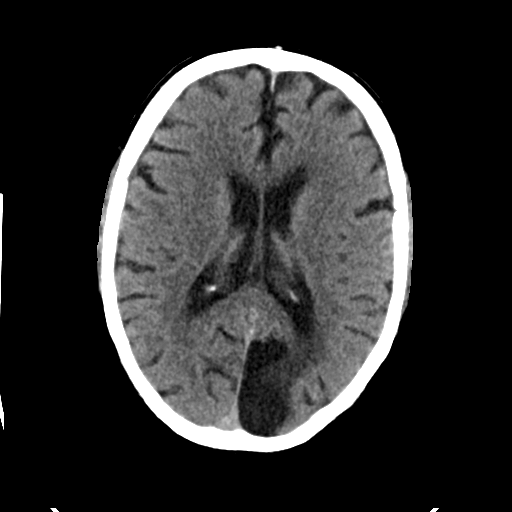
[im 30/30  brain]
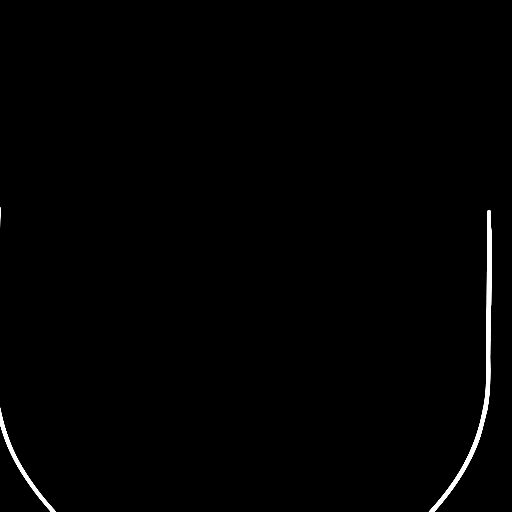

[Series 5: head bone · axial · 0.45mm/px · z∈[+1168,+1270]mm · 6 of 73 slices shown]
[im 11/73  bone]
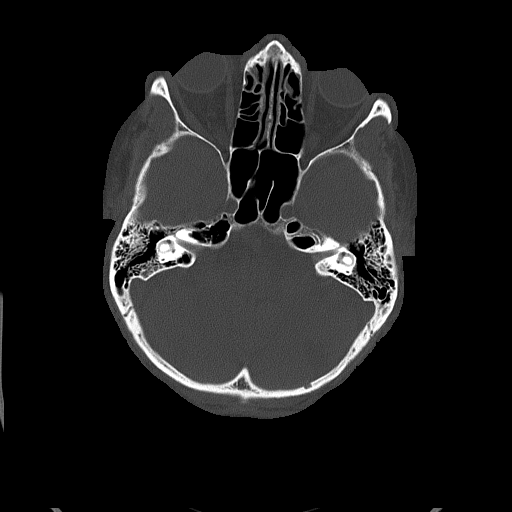
[im 21/73  bone]
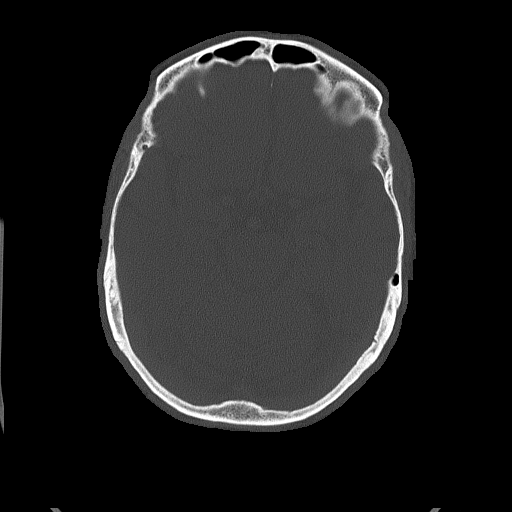
[im 31/73  bone]
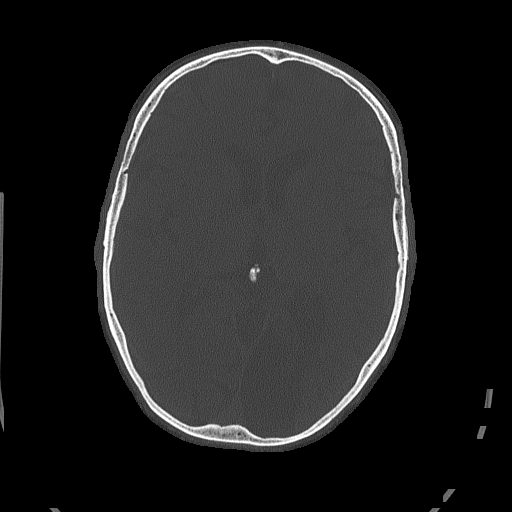
[im 42/73  bone]
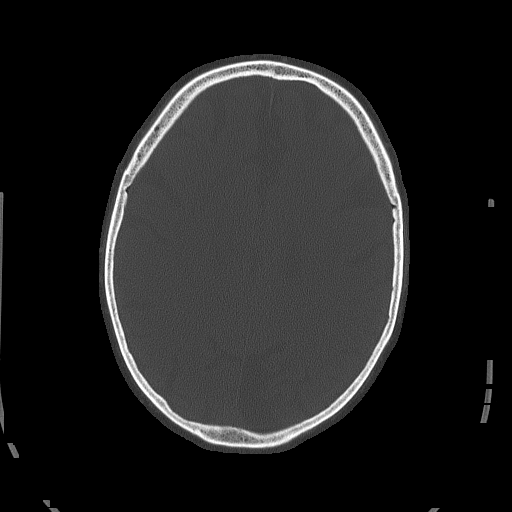
[im 52/73  bone]
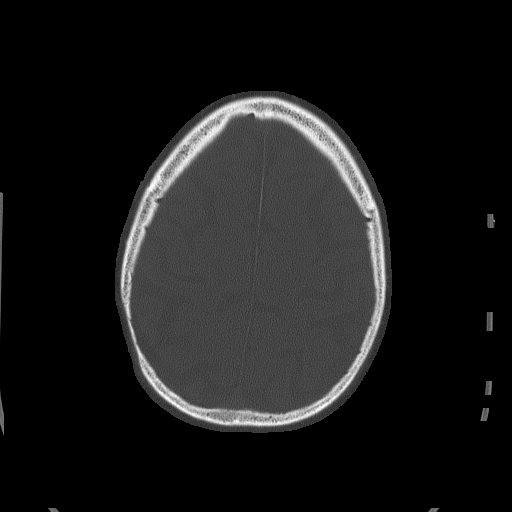
[im 62/73  bone]
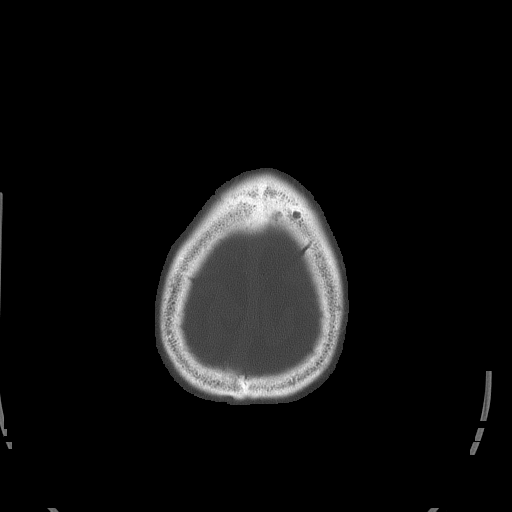

[Series 6: head without cor · coronal · non-contrast · 0.28mm/px · 3 of 70 slices shown]
[im 18/70  brain]
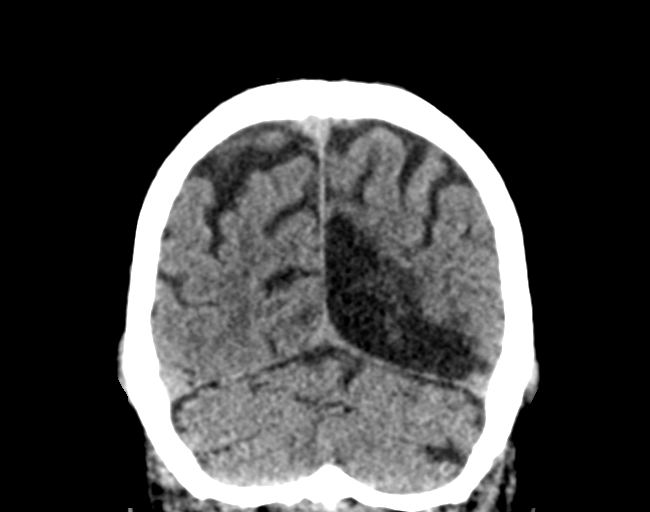
[im 35/70  brain]
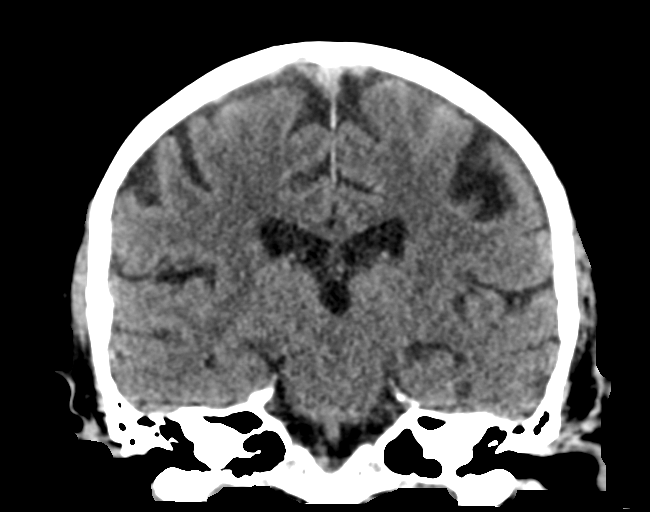
[im 52/70  brain]
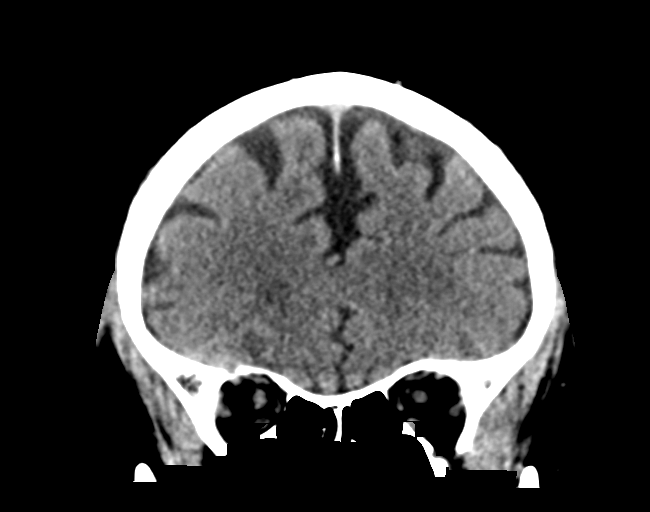

[Series 7: head without sag · sagittal · non-contrast · 0.38mm/px · 2 of 62 slices shown]
[im 21/62  brain]
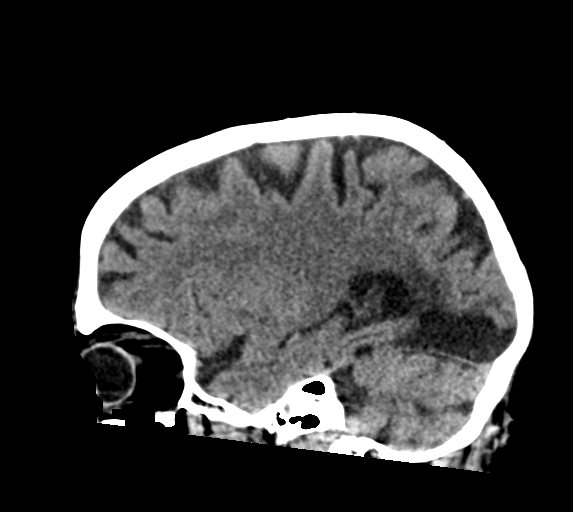
[im 41/62  brain]
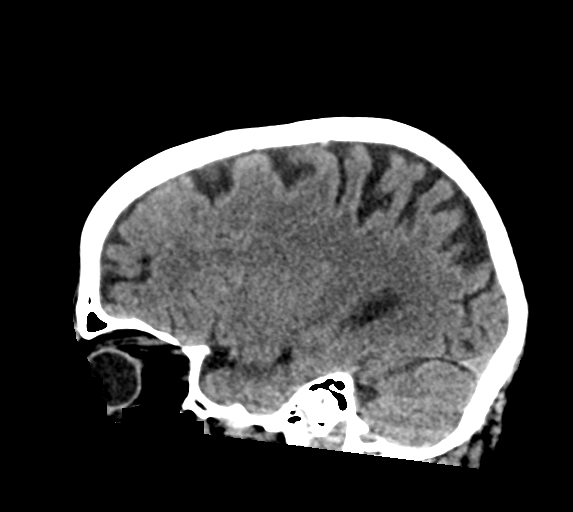

[Series 8: c_spine 2.0 st · axial · 0.33mm/px · z∈[+985,+1005]mm · 2 of 90 slices shown]
[im 10/90  brain]
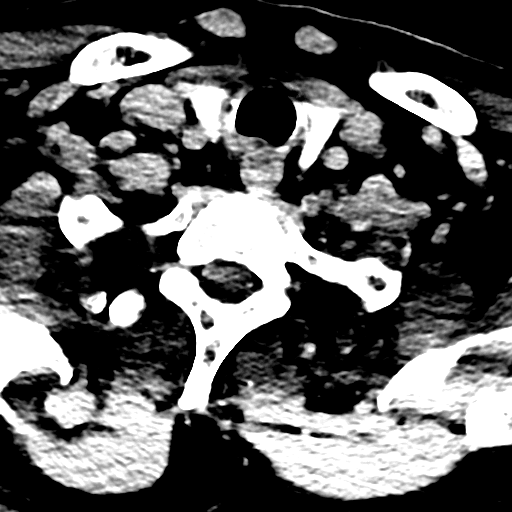
[im 20/90  brain]
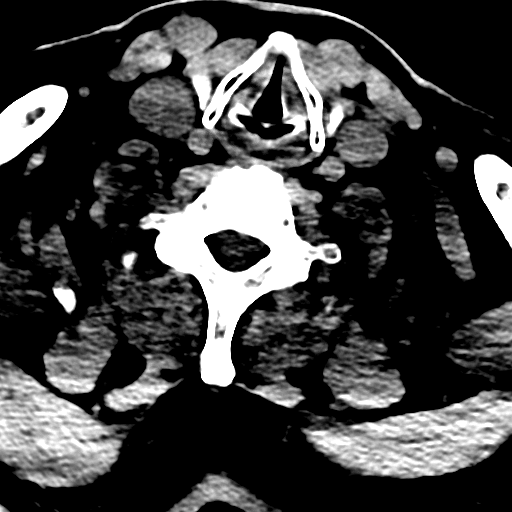

[16 of 47 positions shown; findings below may reference images not displayed]

FINDINGS: CT HEAD FINDINGS

Brain: No evidence of acute infarction, hemorrhage, hydrocephalus,
extra-axial collection or mass lesion/mass effect.

Prominence of the ventricles and sulci reflects mild to moderate
cortical volume loss. Chronic encephalomalacia is noted at the left
occipital lobe, reflecting remote infarct. Mild cerebellar atrophy
is noted. Small chronic infarcts are noted at the left cerebellar
hemisphere. A small chronic lacunar infarct is noted at the right
basal ganglia. Mild periventricular white matter change likely
reflects small vessel ischemic microangiopathy.

The brainstem and fourth ventricle are within normal limits. No mass
effect or midline shift is seen.

Vascular: No hyperdense vessel or unexpected calcification.

Skull: There is no evidence of fracture; visualized osseous
structures are unremarkable in appearance.

Sinuses/Orbits: The orbits are within normal limits. There is mild
partial opacification of the left maxillary sinus. The remaining
paranasal sinuses and mastoid air cells are well-aerated.

Other: No significant soft tissue abnormalities are seen.

CT CERVICAL SPINE FINDINGS

Alignment: Normal.

Skull base and vertebrae: No acute fracture. No primary bone lesion
or focal pathologic process.

Soft tissues and spinal canal: No prevertebral fluid or swelling. No
visible canal hematoma.

Disc levels: There is mild disc space narrowing at C3-C4, with
scattered anterior and posterior disc osteophyte complexes along the
cervical spine.

Upper chest: The visualized lung apices are clear. The thyroid gland
is unremarkable in appearance.

Other: No additional soft tissue abnormalities are seen.
IMPRESSION: 1. No evidence of traumatic intracranial injury or fracture.
2. No evidence of fracture or subluxation along the cervical spine.
3. Mild to moderate cortical volume loss and scattered small vessel
ischemic microangiopathy.
4. Chronic encephalomalacia at the left occipital lobe, reflecting
remote infarct. Small chronic infarcts at the left cerebellar
hemisphere, and small chronic lacunar infarct at the right basal
ganglia.
5. Mild partial opacification of the left maxillary sinus.
6. Mild degenerative change along the cervical spine.

## 2018-10-21 ENCOUNTER — Emergency Department (HOSPITAL_COMMUNITY)
Admission: EM | Admit: 2018-10-21 | Discharge: 2018-10-21 | Disposition: A | Discharge status: Home / Self Care | Payer: Medicare Other | Attending: Emergency Medicine | Admitting: Emergency Medicine

## 2018-10-21 ENCOUNTER — Other Ambulatory Visit: Payer: Self-pay

## 2018-10-21 ENCOUNTER — Emergency Department (HOSPITAL_COMMUNITY): Payer: Medicare Other

## 2018-10-21 ENCOUNTER — Encounter (HOSPITAL_COMMUNITY): Payer: Self-pay | Admitting: Emergency Medicine

## 2018-10-21 DIAGNOSIS — R531 Weakness: Secondary | ICD-10-CM

## 2018-10-21 DIAGNOSIS — Z7984 Long term (current) use of oral hypoglycemic drugs: Secondary | ICD-10-CM | POA: Insufficient documentation

## 2018-10-21 DIAGNOSIS — I69311 Memory deficit following cerebral infarction: Secondary | ICD-10-CM | POA: Diagnosis not present

## 2018-10-21 DIAGNOSIS — E119 Type 2 diabetes mellitus without complications: Secondary | ICD-10-CM | POA: Insufficient documentation

## 2018-10-21 DIAGNOSIS — I10 Essential (primary) hypertension: Secondary | ICD-10-CM | POA: Insufficient documentation

## 2018-10-21 DIAGNOSIS — R0602 Shortness of breath: Secondary | ICD-10-CM | POA: Diagnosis not present

## 2018-10-21 DIAGNOSIS — Z79899 Other long term (current) drug therapy: Secondary | ICD-10-CM | POA: Diagnosis not present

## 2018-10-21 DIAGNOSIS — Z7901 Long term (current) use of anticoagulants: Secondary | ICD-10-CM | POA: Insufficient documentation

## 2018-10-21 DIAGNOSIS — U071 COVID-19: Secondary | ICD-10-CM | POA: Diagnosis not present

## 2018-10-21 DIAGNOSIS — Z20822 Contact with and (suspected) exposure to covid-19: Secondary | ICD-10-CM

## 2018-10-21 DIAGNOSIS — Z20828 Contact with and (suspected) exposure to other viral communicable diseases: Secondary | ICD-10-CM

## 2018-10-21 LAB — CBC WITH DIFFERENTIAL/PLATELET
Abs Immature Granulocytes: 0.06 10*3/uL (ref 0.00–0.07)
Basophils Absolute: 0 10*3/uL (ref 0.0–0.1)
Basophils Relative: 0 %
Eosinophils Absolute: 0.1 10*3/uL (ref 0.0–0.5)
Eosinophils Relative: 3 %
HCT: 42.3 % (ref 39.0–52.0)
Hemoglobin: 13.9 g/dL (ref 13.0–17.0)
Immature Granulocytes: 1 %
Lymphocytes Relative: 16 %
Lymphs Abs: 0.9 10*3/uL (ref 0.7–4.0)
MCH: 29.9 pg (ref 26.0–34.0)
MCHC: 32.9 g/dL (ref 30.0–36.0)
MCV: 91 fL (ref 80.0–100.0)
Monocytes Absolute: 0.5 10*3/uL (ref 0.1–1.0)
Monocytes Relative: 9 %
Neutro Abs: 3.9 10*3/uL (ref 1.7–7.7)
Neutrophils Relative %: 71 %
Platelets: 233 10*3/uL (ref 150–400)
RBC: 4.65 MIL/uL (ref 4.22–5.81)
RDW: 14.6 % (ref 11.5–15.5)
WBC: 5.6 10*3/uL (ref 4.0–10.5)
nRBC: 0 % (ref 0.0–0.2)

## 2018-10-21 LAB — COMPREHENSIVE METABOLIC PANEL
ALT: 36 U/L (ref 0–44)
AST: 40 U/L (ref 15–41)
Albumin: 3.2 g/dL — ABNORMAL LOW (ref 3.5–5.0)
Alkaline Phosphatase: 87 U/L (ref 38–126)
Anion gap: 12 (ref 5–15)
BUN: 21 mg/dL (ref 8–23)
CO2: 18 mmol/L — ABNORMAL LOW (ref 22–32)
Calcium: 8.8 mg/dL — ABNORMAL LOW (ref 8.9–10.3)
Chloride: 105 mmol/L (ref 98–111)
Creatinine, Ser: 0.9 mg/dL (ref 0.61–1.24)
GFR calc Af Amer: >60 mL/min (ref >60)
GFR calc non Af Amer: >60 mL/min (ref >60)
Glucose, Bld: 138 mg/dL — ABNORMAL HIGH (ref 70–99)
Potassium: 4.1 mmol/L (ref 3.5–5.1)
Sodium: 135 mmol/L (ref 135–145)
Total Bilirubin: 1.1 mg/dL (ref 0.3–1.2)
Total Protein: 7.3 g/dL (ref 6.5–8.1)

## 2018-10-21 LAB — PROTIME-INR
INR: 1.7 — ABNORMAL HIGH (ref 0.8–1.2)
Prothrombin Time: 19.4 seconds — ABNORMAL HIGH (ref 11.4–15.2)

## 2018-10-21 LAB — TRIGLYCERIDES: Triglycerides: 149 mg/dL (ref <150)

## 2018-10-21 LAB — PROCALCITONIN: Procalcitonin: <0.1 ng/mL

## 2018-10-21 LAB — FIBRINOGEN: Fibrinogen: 743 mg/dL — ABNORMAL HIGH (ref 210–475)

## 2018-10-21 LAB — LACTIC ACID, PLASMA: Lactic Acid, Venous: 2.1 mmol/L (ref 0.5–1.9)

## 2018-10-21 LAB — D-DIMER, QUANTITATIVE: D-Dimer, Quant: 0.89 ug/mL-FEU — ABNORMAL HIGH (ref 0.00–0.50)

## 2018-10-21 LAB — LACTATE DEHYDROGENASE: LDH: 245 U/L — ABNORMAL HIGH (ref 98–192)

## 2018-10-21 LAB — C-REACTIVE PROTEIN: CRP: 10.4 mg/dL — ABNORMAL HIGH (ref <1.0)

## 2018-10-21 LAB — FERRITIN: Ferritin: 175 ng/mL (ref 24–336)

## 2018-10-21 MED ORDER — SODIUM CHLORIDE 0.9 % IV BOLUS
500.0000 mL | Freq: Once | INTRAVENOUS | Status: AC
  Administered 2018-10-21: 500 mL via INTRAVENOUS

## 2018-10-21 NOTE — Discharge Instructions (Addendum)
Stay hydrated.   Continue tylenol as needed for fevers   See your doctor   Your COVID is pending. Please quarantine at home until your results come back   Return to ER if you have worse chest pain, shortness of breath, cough, fever      Person Under Monitoring Name: [name]  Location: [place] [address]   Infection Prevention Recommendations for Individuals Confirmed to have, or Being Evaluated for, 2019 Novel Coronavirus (COVID-19) Infection Who Receive Care at Home  Individuals who are confirmed to have, or are being evaluated for, COVID-19 should follow the prevention steps below until a healthcare provider or local or state health department says they can return to normal activities.  Stay home except to get medical care You should restrict activities outside your home, except for getting medical care. Do not go to work, school, or public areas, and do not use public transportation or taxis.  Call ahead before visiting your doctor Before your medical appointment, call the healthcare provider and tell them that you have, or are being evaluated for, COVID-19 infection. This will help the healthcare providers office take steps to keep other people from getting infected. Ask your healthcare provider to call the local or state health department.  Monitor your symptoms Seek prompt medical attention if your illness is worsening (e.g., difficulty breathing). Before going to your medical appointment, call the healthcare provider and tell them that you have, or are being evaluated for, COVID-19 infection. Ask your healthcare provider to call the local or state health department.  Wear a facemask You should wear a facemask that covers your nose and mouth when you are in the same room with other people and when you visit a healthcare provider. People who live with or visit you should also wear a facemask while they are in the same room with  you.  Separate yourself from other people in your home As much as possible, you should stay in a different room from other people in your home. Also, you should use a separate bathroom, if available.  Avoid sharing household items You should not share dishes, drinking glasses, cups, eating utensils, towels, bedding, or other items with other people in your home. After using these items, you should wash them thoroughly with soap and water.  Cover your coughs and sneezes Cover your mouth and nose with a tissue when you cough or sneeze, or you can cough or sneeze into your sleeve. Throw used tissues in a lined trash can, and immediately wash your hands with soap and water for at least 20 seconds or use an alcohol-based hand rub.  Wash your [facility] your hands often and thoroughly with soap and water for at least 20 seconds. You can use an alcohol-based hand sanitizer if soap and water are not available and if your hands are not visibly dirty. Avoid touching your eyes, nose, and mouth with unwashed hands.   Prevention Steps for Caregivers and Household Members of Individuals Confirmed to have, or Being Evaluated for, COVID-19 Infection Being Cared for in the Home  If you live with, or provide care at home for, a person confirmed to have, or being evaluated for, COVID-19 infection please follow these guidelines to prevent infection:  Follow healthcare providers instructions Make sure that you understand and can help the patient follow any healthcare provider instructions for all care.  Provide for the patients basic needs You should help the patient with basic needs in  the home and provide support for getting groceries, prescriptions, and other personal needs.  Monitor the patients symptoms If they are getting sicker, call his or her medical provider and tell them that the patient has, or is being evaluated for, COVID-19 infection. This will help the healthcare providers office  take steps to keep other people from getting infected. Ask the healthcare provider to call the local or state health department.  Limit the number of people who have contact with the patient If possible, have only one caregiver for the patient. Other household members should stay in another home or place of residence. If this is not possible, they should stay in another room, or be separated from the patient as much as possible. Use a separate bathroom, if available. Restrict visitors who do not have an essential need to be in the home.  Keep older adults, very young children, and other sick people away from the patient Keep older adults, very young children, and those who have compromised immune systems or chronic health conditions away from the patient. This includes people with chronic heart, lung, or kidney conditions, diabetes, and cancer.  Ensure good ventilation Make sure that shared spaces in the home have good air flow, such as from an air conditioner or an opened window, weather permitting.  Wash your hands often Wash your hands often and thoroughly with soap and water for at least 20 seconds. You can use an alcohol based hand sanitizer if soap and water are not available and if your hands are not visibly dirty. Avoid touching your eyes, nose, and mouth with unwashed hands. Use disposable paper towels to dry your hands. If not available, use dedicated cloth towels and replace them when they become wet.  Wear a facemask and gloves Wear a disposable facemask at all times in the room and gloves when you touch or have contact with the patients blood, body fluids, and/or secretions or excretions, such as sweat, saliva, sputum, nasal mucus, vomit, urine, or feces.  Ensure the mask fits over your nose and mouth tightly, and do not touch it during use. Throw out disposable facemasks and gloves after using them. Do not reuse. Wash your hands immediately after removing your facemask and  gloves. If your personal clothing becomes contaminated, carefully remove clothing and launder. Wash your hands after handling contaminated clothing. Place all used disposable facemasks, gloves, and other waste in a lined container before disposing them with other household waste. Remove gloves and wash your hands immediately after handling these items.  Do not share dishes, glasses, or other household items with the patient Avoid sharing household items. You should not share dishes, drinking glasses, cups, eating utensils, towels, bedding, or other items with a patient who is confirmed to have, or being evaluated for, COVID-19 infection. After the person uses these items, you should wash them thoroughly with soap and water.  Wash laundry thoroughly Immediately remove and wash clothes or bedding that have blood, body fluids, and/or secretions or excretions, such as sweat, saliva, sputum, nasal mucus, vomit, urine, or feces, on them. Wear gloves when handling laundry from the patient. Read and follow directions on labels of laundry or clothing items and detergent. In general, wash and dry with the warmest temperatures recommended on the label.  Clean all areas the individual has used often Clean all touchable surfaces, such as counters, tabletops, doorknobs, bathroom fixtures, toilets, phones, keyboards, tablets, and bedside tables, every day. Also, clean any surfaces that may have blood, body fluids,  and/or secretions or excretions on them. Wear gloves when cleaning surfaces the patient has come in contact with. Use a diluted bleach solution (e.g., dilute bleach with 1 part bleach and 10 parts water) or a household disinfectant with a label that says EPA-registered for coronaviruses. To make a bleach solution at home, add 1 tablespoon of bleach to 1 quart (4 cups) of water. For a larger supply, add  cup of bleach to 1 gallon (16 cups) of water. Read labels of cleaning products and follow  recommendations provided on product labels. Labels contain instructions for safe and effective use of the cleaning product including precautions you should take when applying the product, such as wearing gloves or eye protection and making sure you have good ventilation during use of the product. Remove gloves and wash hands immediately after cleaning.  Monitor yourself for signs and symptoms of illness Caregivers and household members are considered close contacts, should monitor their health, and will be asked to limit movement outside of the home to the extent possible. Follow the monitoring steps for close contacts listed on the symptom monitoring form.   ? If you have additional questions, contact your local health department or call the epidemiologist on call at [phone] (available 24/7). ? This guidance is subject to change. For the most up-to-date guidance from [facility], please refer to their website: [email or web address]

## 2018-10-21 NOTE — ED Notes (Signed)
ED Provider at bedside. 

## 2018-10-21 NOTE — ED Notes (Signed)
Pt ambulated in room while monitoring oxygen level and heartrate. Oxygen maintained 93-95% and heartrate was 90-98 throughout.

## 2018-10-21 NOTE — ED Notes (Signed)
Patient's friend, Sharee Pimple, called and provided with update per patient request.

## 2018-10-21 NOTE — ED Notes (Signed)
XR at bedside

## 2018-10-21 NOTE — ED Notes (Signed)
Patient's friend, Sharee Pimple, is on the way to get patient.

## 2018-10-21 NOTE — ED Triage Notes (Signed)
Patient c/o cough, weakness, and diarrhea x3 weeks. Denies chest pain and SOB.

## 2018-10-21 NOTE — ED Provider Notes (Signed)
[facility]-EMERGENCY DEPT Provider Note   CSN: [id number] Arrival date & time: 10/21/18  1555     History   Chief Complaint Chief Complaint  Patient presents with  . Weakness  . Cough    HPI TIA GELB is a 78 y.o. male hx of DM, DVT, HL, HTN, here with possible COVID.  Patient currently lives with his ex-wife as well as ex-wife's husband.  Ex-wife Montel Clock recently died of Covid about a week ago.  Patient apparently has been having fevers for the last 3 weeks.  Over the last week or so and his fever has been around 102 every day.  Patient also has some progressive shortness of breath as well.  They have a pulse ox at home and his oxygen is somewhere around 91 to 92%.  Patient states that he has some short-term memory loss and asked me to call his ex-wife.  I talked to his ex-wife and she corroborated the same story.  She states that his primary care doctor sent him for lab work and a chest x-ray.  He has seen some loose stools but no vomiting.     The history is provided by the patient and a friend.    Past Medical History:  Diagnosis Date  . Cirrhosis (HCC)    noted on CT 05/28/16  . Closed compression fracture of L1 lumbar vertebra 05/2016  . Diabetes mellitus    type 2  . Diverticulitis   . DVT (deep venous thrombosis) (HCC) 2002  . Dyspnea    with exertion  . History of kidney stones   . Hyperlipidemia   . Hypertension   . Insomnia   . Kidney stones    hx of  . Liver mass, right lobe    noted on CT 05/28/16  . Pancreatic cyst    noted on CT 05/28/16  . Perforated diverticulum   . Presence of inferior vena cava filter 2002  . Pulmonary embolism (HCC) 2002  . Secondary hypercoagulability disorder (HCC)   . Status post placement of ureteral stent:R 10/15/2017 10/26/2017  . Stroke [facility]) 2002   right eye no peripheral vision, short term memory loss    Patient Active Problem List   Diagnosis Date Noted  . Acute pancreatitis 11/28/2017  .  Sepsis (HCC) 10/26/2017  . Severe sepsis with acute organ dysfunction (HCC) 10/26/2017  . Metabolic acidosis 10/26/2017  . Pyelonephritis 10/26/2017  . Status post placement of ureteral stent:R 10/15/2017 10/26/2017  . Dehydration 10/26/2017  . Right ureteral stone 10/15/2017  . Cirrhosis of liver (HCC) 06/07/2016  . Pancreatic cyst 06/07/2016  . AF (atrial fibrillation) (HCC) 06/07/2016  . Hyperlipidemia 06/07/2016  . History of CVA (cerebrovascular accident) 06/07/2016  . Thrombocytopenia (HCC) 06/07/2016  . Closed compression fracture of L1 lumbar vertebra 05/28/2016  . HTN (hypertension) 05/28/2016  . Diabetes mellitus type II, non insulin dependent (HCC) 05/28/2016  . Closed compression fracture of body of lumbar vertebra (HCC) 05/28/2016  . Incisional hernia x 2.  Repaired 9/60/2013. 08/21/2011  . Presence of inferior vena cava filter 01/06/2000    Past Surgical History:  Procedure Laterality Date  . APPENDECTOMY  1949  . BACK SURGERY     lower  . BIOPSY  01/20/2018   Procedure: BIOPSY;  Surgeon: Kathi Der, MD;  Location: [facility];  Service: Gastroenterology;;  . CARPAL TUNNEL RELEASE  2002 both wrists  . COLON SURGERY  07/19/2009   sigmoid colectomy   . CYSTOSCOPY WITH BIOPSY N/A  01/26/2017   Procedure: CYSTOSCOPY;  Surgeon: Irine Seal, MD;  Location[facility];  Service: Urology;  Laterality: N/A;  . CYSTOSCOPY WITH STENT PLACEMENT Right 10/15/2017   Procedure: CYSTOSCOPY WITH STENT PLACEMENT;  Surgeon: [name], MD;  Location: [facility];  Service: Urology;  Laterality: Right;  . CYSTOSCOPY/URETEROSCOPY/HOLMIUM LASER/STENT PLACEMENT Right 11/04/2017   Procedure: CYSTOSCOPY/RIGHT URETEROSCOPY/HOLMIUM LASER/STENT EXCHANGE;  Surgeon: Irine Seal, MD;  Location: [facility];  Service: Urology;  Laterality: Right;  . ESOPHAGEAL BANDING N/A 01/20/2018   Procedure: ESOPHAGEAL BANDING;  Surgeon: Otis Brace, MD;  Location: [facility];  Service:  Gastroenterology;  Laterality: N/A;  . ESOPHAGOGASTRODUODENOSCOPY (EGD) WITH PROPOFOL N/A 01/20/2018   Procedure: ESOPHAGOGASTRODUODENOSCOPY (EGD) WITH PROPOFOL;  Surgeon: Otis Brace, MD;  Location: [facility];  Service: Gastroenterology;  Laterality: N/A;  . [place], 02/03/2010   lap ventral hernia repair  . [place]  . LITHOTRIPSY  2010   bleeding in kidney after  . surgery for diverticulosis  2011  . TEE WITHOUT CARDIOVERSION N/A 11/03/2017   Procedure: TRANSESOPHAGEAL ECHOCARDIOGRAM (TEE);  Surgeon: Buford Dresser, MD;  Location: [facility] ENDOSCOPY;  Service: Cardiovascular;  Laterality: N/A;  . TONSILLECTOMY  1947  . TONSILLECTOMY AND ADENOIDECTOMY    . VENTRAL HERNIA REPAIR  09/11/2011   Procedure: LAPAROSCOPIC VENTRAL HERNIA;  Surgeon: Shann Medal, MD;  Location: [facility];  Service: General;  Laterality: N/A;  Lapaaroscopic Repair Ventral Incisional Hernias x 2        Home Medications    Prior to Admission medications   Medication Sig Start Date End Date Taking? Authorizing Provider  B Complex-C (B-COMPLEX WITH VITAMIN C) tablet Take 1 tablet by mouth daily.    [provider]  ferrous sulfate 325 (65 FE) MG tablet Take 325 mg by mouth daily with breakfast.    [provider]  glipiZIDE (GLUCOTROL XL) 2.5 MG 24 hr tablet Take 2.5 mg by mouth every evening.  10/18/17   [provider]  losartan (COZAAR) 100 MG tablet Take 100 mg by mouth every evening.     [provider]  metFORMIN (GLUCOPHAGE-XR) 500 MG 24 hr tablet Take 1,000 mg by mouth 2 (two) times daily. 10/16/17   [provider]  ondansetron (ZOFRAN) 4 MG tablet Take 1 tablet (4 mg total) by mouth every 8 (eight) hours as needed for nausea or vomiting. 10/15/17   [name], MD  pantoprazole (PROTONIX) 40 MG tablet Take 1 tablet (40 mg total) by mouth daily. 11/29/17 01/15/19  Elodia Florence., MD  simvastatin  (ZOCOR) 40 MG tablet Take 40 mg by mouth every evening.     [provider]  warfarin (COUMADIN) 2 MG tablet Take 3-4 mg by mouth See admin instructions. Take 2 tablets (4 mg) by mouth on Mondays & Fridays, then take 1.5 tablets (3 mg) by mouth on all other days.    [provider]    Family History Family History  Problem Relation Age of Onset  . Lung disease Father   . Cancer Neg Hx     Social History Social History   Tobacco Use  . Smoking status: Never Smoker  . Smokeless tobacco: Never Used  Substance Use Topics  . Alcohol use: No  . Drug use: No     Allergies   Prednisone and Zithromax [azithromycin]   Review of Systems Review of Systems  Constitutional: Positive for fever.  Respiratory: Positive for cough.  Neurological: Positive for weakness.  All other systems reviewed and are negative.    Physical Exam Updated Vital Signs BP 123/76   Pulse 80   Temp 98.7 F (37.1 C)   Resp (!) 22   SpO2 95%   Physical Exam Vitals signs and nursing note reviewed.  Constitutional:      Comments: Chronically ill but not acutely ill   HENT:     Head: Normocephalic.     Nose: Nose normal.     Mouth/Throat:     Mouth: Mucous membranes are dry.  Eyes:     Extraocular Movements: Extraocular movements intact.     Pupils: Pupils are equal, round, and reactive to light.  Neck:     Musculoskeletal: Normal range of motion.  Cardiovascular:     Rate and Rhythm: Normal rate and regular rhythm.     Pulses: Normal pulses.     Heart sounds: Normal heart sounds.  Pulmonary:     Effort: Pulmonary effort is normal.     Breath sounds: Normal breath sounds.  Abdominal:     General: Abdomen is flat.     Palpations: Abdomen is soft.  Musculoskeletal: Normal range of motion.  Skin:    General: Skin is warm.     Capillary Refill: Capillary refill takes less than 2 seconds.  Neurological:     General: No focal deficit present.     Mental Status: He is  oriented to person, place, and time.  Psychiatric:        Mood and Affect: Mood normal.      ED Treatments / Results  Labs (all labs ordered are listed, but only abnormal results are displayed) Labs Reviewed  LACTIC ACID, PLASMA - Abnormal; Notable for the following components:      Result Value   Lactic Acid, Venous 2.1 (*)    All other components within normal limits  COMPREHENSIVE METABOLIC PANEL - Abnormal; Notable for the following components:   CO2 18 (*)    Glucose, Bld 138 (*)    Calcium 8.8 (*)    Albumin 3.2 (*)    All other components within normal limits  D-DIMER, QUANTITATIVE (NOT AT [facility]) - Abnormal; Notable for the following components:   D-Dimer, Quant 0.89 (*)    All other components within normal limits  LACTATE DEHYDROGENASE - Abnormal; Notable for the following components:   LDH 245 (*)    All other components within normal limits  FIBRINOGEN - Abnormal; Notable for the following components:   Fibrinogen 743 (*)    All other components within normal limits  C-REACTIVE PROTEIN - Abnormal; Notable for the following components:   CRP 10.4 (*)    All other components within normal limits  PROTIME-INR - Abnormal; Notable for the following components:   Prothrombin Time 19.4 (*)    INR 1.7 (*)    All other components within normal limits  CULTURE, BLOOD (ROUTINE X 2)  CULTURE, BLOOD (ROUTINE X 2)  SARS CORONAVIRUS 2 (TAT 6-24 HRS)  CBC WITH DIFFERENTIAL/PLATELET  PROCALCITONIN  FERRITIN  TRIGLYCERIDES    EKG EKG Interpretation  Date/Time:  Friday October 21 2018 16:43:04 EDT Ventricular Rate:  92 PR Interval:    QRS Duration: 143 QT Interval:  387 QTC Calculation: 479 R Axis:   -43 Text Interpretation:  Sinus rhythm RBBB and LAFB No significant change since last tracing Confirmed by Richardean Canal ([id number]) on 10/21/2018 5:39:27 PM   Radiology Dg Chest [facility] 1 View  Result  Date: 10/21/2018 CLINICAL DATA:  Cough, weakness EXAM: PORTABLE CHEST 1  VIEW COMPARISON:  10/26/2017 FINDINGS: Heart size is within normal limits. Calcific aortic knob. There are patchy bilateral airspace opacities in a predominantly peripheral distribution, left worse than right. No pleural effusion. No pneumothorax. IMPRESSION: Multifocal airspace opacities with a predominantly peripheral distribution, which can be seen in the setting of multifocal atypical/viral pneumonia. Electronically Signed   By: Duanne GuessNicholas  Plundo M.D.   On: 10/21/2018 17:13    Procedures Procedures (including critical care time)  Medications Ordered in ED Medications  sodium chloride 0.9 % bolus 500 mL (0 mLs Intravenous Stopped 10/21/18 1752)     Initial Impression / Assessment and Plan / ED Course  I have reviewed the triage vital signs and the nursing notes.  Pertinent labs & imaging results that were available during my care of the patient were reviewed by me and considered in my medical decision making (see chart for details).       Lilyan GilfordJackie L Mcallister is a 78 y.o. male here with possible COVID exposure. O2 is 95% on room air. He appears chronically ill but not acutely ill. Patient not hypotensive. I talked to Noreene LarssonJill, his ex-wife. Will get COVID pre admission labs, test for COVID, get CXR.   6:41 PM  CXR showed possible viral pneumonia. COVID pending. Inflammatory markers slightly elevated consistent with possible COVID. Patient ambulated with pulse ox 93-95%. Doesn't qualify for admission. I updated his ex-wife and told him to quarantine at home   Final Clinical Impressions(s) / ED Diagnoses   Final diagnoses:  None    ED Discharge Orders    None       Charlynne PanderYao, Deloyce Walthers Hsienta, MD 10/21/18 [id number]

## 2018-10-22 LAB — SARS CORONAVIRUS 2 (TAT 6-24 HRS): SARS Coronavirus 2: POSITIVE — AB

## 2018-10-26 LAB — CULTURE, BLOOD (ROUTINE X 2)
Culture: NO GROWTH
Culture: NO GROWTH
Special Requests: ADEQUATE

## 2018-11-08 ENCOUNTER — Other Ambulatory Visit: Payer: Self-pay | Admitting: Family Medicine

## 2018-11-08 DIAGNOSIS — R935 Abnormal findings on diagnostic imaging of other abdominal regions, including retroperitoneum: Secondary | ICD-10-CM

## 2018-11-30 ENCOUNTER — Ambulatory Visit
Admission: RE | Admit: 2018-11-30 | Discharge: 2018-11-30 | Disposition: A | Discharge status: Home / Self Care | Payer: Medicare Other | Source: Ambulatory Visit | Attending: Family Medicine | Admitting: Family Medicine

## 2018-11-30 DIAGNOSIS — R935 Abnormal findings on diagnostic imaging of other abdominal regions, including retroperitoneum: Secondary | ICD-10-CM

## 2018-11-30 MED ORDER — GADOBENATE DIMEGLUMINE 529 MG/ML IV SOLN
14.0000 mL | Freq: Once | INTRAVENOUS | Status: AC | PRN
  Administered 2018-11-30: 14 mL via INTRAVENOUS

## 2020-01-09 ENCOUNTER — Ambulatory Visit (INDEPENDENT_AMBULATORY_CARE_PROVIDER_SITE_OTHER): Payer: Medicare Other | Admitting: Otolaryngology

## 2020-01-09 ENCOUNTER — Other Ambulatory Visit: Payer: Self-pay

## 2020-01-09 DIAGNOSIS — H903 Sensorineural hearing loss, bilateral: Secondary | ICD-10-CM

## 2020-01-09 DIAGNOSIS — H6123 Impacted cerumen, bilateral: Secondary | ICD-10-CM

## 2020-01-09 NOTE — Progress Notes (Signed)
HPI: [name] is a 80 y.o. male who presents for evaluation of decreased hearing in his right ear that began about 2 months ago.  He first noticed this while in the hospital while getting a kidney stone removed and a stent placed.  He was subsequently discharged home and tried eardrops at home but could not improve the hearing in the right ear.  He has underlying bilateral hearing loss but is noticed significant drop in the hearing in the right ear since his hospitalization.  Denies any pain or drainage.  Past Medical History:  Diagnosis Date  . Cirrhosis (HCC)    noted on CT 05/28/16  . Closed compression fracture of L1 lumbar vertebra 05/2016  . Diabetes mellitus    type 2  . Diverticulitis   . DVT (deep venous thrombosis) (HCC) 2002  . Dyspnea    with exertion  . History of kidney stones   . Hyperlipidemia   . Hypertension   . Insomnia   . Kidney stones    hx of  . Liver mass, right lobe    noted on CT 05/28/16  . Pancreatic cyst    noted on CT 05/28/16  . Perforated diverticulum   . Presence of inferior vena cava filter 2002  . Pulmonary embolism (HCC) 2002  . Secondary hypercoagulability disorder (HCC)   . Status post placement of ureteral stent:R 10/15/2017 10/26/2017  . Stroke [facility]) 2002   right eye no peripheral vision, short term memory loss   Past Surgical History:  Procedure Laterality Date  . APPENDECTOMY  1949  . BACK SURGERY     lower  . BIOPSY  01/20/2018   Procedure: BIOPSY;  Surgeon: Kathi Der, MD;  Location: [facility];  Service: Gastroenterology;;  . CARPAL TUNNEL RELEASE  2002 both wrists  . COLON SURGERY  07/19/2009   sigmoid colectomy   . CYSTOSCOPY WITH BIOPSY N/A 01/26/2017   Procedure: CYSTOSCOPY;  Surgeon: Bjorn Pippin, MD;  Location: [facility];  Service: Urology;  Laterality: N/A;  . CYSTOSCOPY WITH STENT PLACEMENT Right 10/15/2017   Procedure: CYSTOSCOPY WITH STENT PLACEMENT;  Surgeon: Heloise Purpura, MD;  Location[facility];  Service: Urology;  Laterality: Right;  . CYSTOSCOPY/URETEROSCOPY/HOLMIUM LASER/STENT PLACEMENT Right 11/04/2017   Procedure: CYSTOSCOPY/RIGHT URETEROSCOPY/HOLMIUM LASER/STENT EXCHANGE;  Surgeon: Bjorn Pippin, MD;  [facility]: [facility];  Service: Urology;  Laterality: Right;  . ESOPHAGEAL BANDING N/A 01/20/2018   Procedure: ESOPHAGEAL BANDING;  Surgeon: Kathi Der, MD;  [facility];  Service: Gastroenterology;  Laterality: N/A;  . ESOPHAGOGASTRODUODENOSCOPY (EGD) WITH PROPOFOL N/A 01/20/2018   Procedure: ESOPHAGOGASTRODUODENOSCOPY (EGD) WITH PROPOFOL;  Surgeon: Kathi Der, MD;  [facility];  Service: Gastroenterology;  Laterality: N/A;  . HERNIA REPAIR   1983, 1983, 02/03/2010   lap ventral hernia repair  . KIDNEY STONE SURGERY  1977, 1983  . LITHOTRIPSY  2010   bleeding in kidney after  . surgery for diverticulosis  2011  . TEE WITHOUT CARDIOVERSION N/A 11/03/2017   Procedure: TRANSESOPHAGEAL ECHOCARDIOGRAM (TEE);  Surgeon: Jodelle Red, MD;  Location: [facility] END[facility]COPY;  Service: Cardiovascular;  Laterality: N/A;  . TONSILLECTOMY  1947  . TONSILLECTOMY AND ADENOIDECTOMY    . VENTRAL HERNIA REPAIR  09/11/2011   Procedure: LAPAROSCOPIC VENTRAL HERNIA;  Surgeon: Kandis Cocking, MD;  Location: [facility];  Service: General;  Laterality: N/A;  Lapaaroscopic Repair Ventral Incisional Hernias x 2   Social History   Socioeconomic History  . Marital status: Divorced    Spouse name:  Not on file  . Number of children: Not on file  . Years of education: Not on file  . Highest education level: Not on file  Occupational History  . Not on file  Tobacco Use  . Smoking status: Never Smoker  . Smokeless tobacco: Never Used  Vaping Use  . Vaping Use: Never used  Substance and Sexual Activity  . Alcohol use: No  . Drug use: No  . Sexual activity: Not on file  Other Topics Concern  . Not on file  Social History Narrative  . Not on file   Social Determinants  of Health   Financial Resource Strain: Not on file  Food Insecurity: Not on file  Transportation Needs: Not on file  Physical Activity: Not on file  Stress: Not on file  Social Connections: Not on file   Family History  Problem Relation Age of Onset  . Lung disease Father   . Cancer Neg Hx    Allergies  Allergen Reactions  . Prednisone     Other reaction(s): Other (See Comments) unknown  . Zithromax [Azithromycin] Other (See Comments)    Weak and flulike symptoms   Prior to Admission medications   Medication Sig Start Date End Date Taking? Authorizing Provider  B Complex-C (B-COMPLEX WITH VITAMIN C) tablet Take 1 tablet by mouth daily.    [provider]  ferrous sulfate 325 (65 FE) MG tablet Take 325 mg by mouth daily with breakfast.    [provider]  glipiZIDE (GLUCOTROL XL) 2.5 MG 24 hr tablet Take 2.5 mg by mouth every evening.  10/18/17   [provider]  losartan (COZAAR) 100 MG tablet Take 100 mg by mouth every evening.     [provider]  metFORMIN (GLUCOPHAGE-XR) 500 MG 24 hr tablet Take 1,000 mg by mouth 2 (two) times daily. 10/16/17   [provider]  ondansetron (ZOFRAN) 4 MG tablet Take 1 tablet (4 mg total) by mouth every 8 (eight) hours as needed for nausea or vomiting. 10/15/17   Heloise Purpura, MD  pantoprazole (PROTONIX) 40 MG tablet Take 1 tablet (40 mg total) by mouth daily. 11/29/17 01/15/19  Zigmund Daniel., MD  simvastatin (ZOCOR) 40 MG tablet Take 40 mg by mouth every evening.     [provider]  warfarin (COUMADIN) 2 MG tablet Take 3-4 mg by mouth See admin instructions. Take 2 tablets (4 mg) by mouth on Mondays & Fridays, then take 1.5 tablets (3 mg) by mouth on all other days.    [provider]     Positive ROS: Otherwise negative  All other systems have been reviewed and were otherwise negative with the exception of those mentioned in the HPI and as above.  Physical  Exam: Constitutional: Alert, well-appearing, no acute distress Ears: External ears without lesions or tenderness.  Left ear canal had minimal cerumen buildup that was removed with suction and forceps.  However the right ear canal was completely occluded with cerumen that was removed with hydroperoxide and suction.  TMs were clear bilaterally.  After cleaning the ear canals hearing screening with a tuning forks revealed symmetric hearing in both ears with a mild hearing loss in both ears with the 1024 tuning fork. Nasal: External nose without lesions. Septum with mild deformity.. Clear nasal passages Oral: Lips and gums without lesions. Tongue and palate mucosa without lesions. Posterior oropharynx clear. Neck: No palpable adenopathy or masses Respiratory: Breathing comfortably  Skin: No facial/neck lesions or rash noted.  Cerumen impaction removal  Date/Time: 01/09/2020 2:08 PM Performed by: Rozetta Nunnery, MD Authorized by: Rozetta Nunnery, MD   Consent:    Consent obtained:  Verbal   Consent given by:  Patient   Risks discussed:  Pain and bleeding Procedure details:    Location:  L ear and R ear   Procedure type: curette, suction and forceps   Post-procedure details:    Inspection:  TM intact and canal normal   Hearing quality:  Improved   Patient tolerance of procedure:  Tolerated well, no immediate complications Comments:     Right ear canal was completely occluded with cerumen that was removed with suction and hydroperoxide irrigation.  Left ear canal had some hairs and small amount of wax that was nonobstructing.  Both TMs were clear.    Assessment: Hearing problems with secondary to cerumen impaction on the right side. Patient has mild underlying bilateral sensorineural hearing loss which was symmetric.  Plan: Discussed with patient concerning obtaining audiogram however he did not want to have audiogram performed today unless it was absolutely necessary. I  discussed with him that he would probably benefit from hearing aids in the near future.  Radene Journey, MD

## 2020-01-10 DIAGNOSIS — I10 Essential (primary) hypertension: Secondary | ICD-10-CM | POA: Diagnosis not present

## 2020-01-10 DIAGNOSIS — E7800 Pure hypercholesterolemia, unspecified: Secondary | ICD-10-CM | POA: Diagnosis not present

## 2020-01-10 DIAGNOSIS — E1142 Type 2 diabetes mellitus with diabetic polyneuropathy: Secondary | ICD-10-CM | POA: Diagnosis not present

## 2020-01-10 DIAGNOSIS — M179 Osteoarthritis of knee, unspecified: Secondary | ICD-10-CM | POA: Diagnosis not present

## 2020-01-11 DIAGNOSIS — N200 Calculus of kidney: Secondary | ICD-10-CM | POA: Diagnosis not present

## 2020-01-11 DIAGNOSIS — Z87440 Personal history of urinary (tract) infections: Secondary | ICD-10-CM | POA: Diagnosis not present

## 2020-01-11 DIAGNOSIS — R8271 Bacteriuria: Secondary | ICD-10-CM | POA: Diagnosis not present

## 2020-01-11 DIAGNOSIS — R3129 Other microscopic hematuria: Secondary | ICD-10-CM | POA: Diagnosis not present

## 2020-01-11 DIAGNOSIS — R82998 Other abnormal findings in urine: Secondary | ICD-10-CM | POA: Diagnosis not present

## 2020-02-01 DIAGNOSIS — E113291 Type 2 diabetes mellitus with mild nonproliferative diabetic retinopathy without macular edema, right eye: Secondary | ICD-10-CM | POA: Diagnosis not present

## 2020-02-06 DIAGNOSIS — M179 Osteoarthritis of knee, unspecified: Secondary | ICD-10-CM | POA: Diagnosis not present

## 2020-02-06 DIAGNOSIS — I10 Essential (primary) hypertension: Secondary | ICD-10-CM | POA: Diagnosis not present

## 2020-02-06 DIAGNOSIS — E1142 Type 2 diabetes mellitus with diabetic polyneuropathy: Secondary | ICD-10-CM | POA: Diagnosis not present

## 2020-02-06 DIAGNOSIS — E7800 Pure hypercholesterolemia, unspecified: Secondary | ICD-10-CM | POA: Diagnosis not present

## 2020-04-04 DIAGNOSIS — I10 Essential (primary) hypertension: Secondary | ICD-10-CM | POA: Diagnosis not present

## 2020-04-04 DIAGNOSIS — M179 Osteoarthritis of knee, unspecified: Secondary | ICD-10-CM | POA: Diagnosis not present

## 2020-04-04 DIAGNOSIS — E1142 Type 2 diabetes mellitus with diabetic polyneuropathy: Secondary | ICD-10-CM | POA: Diagnosis not present

## 2020-04-04 DIAGNOSIS — E7800 Pure hypercholesterolemia, unspecified: Secondary | ICD-10-CM | POA: Diagnosis not present

## 2020-04-11 DIAGNOSIS — Z86711 Personal history of pulmonary embolism: Secondary | ICD-10-CM | POA: Diagnosis not present

## 2020-04-11 DIAGNOSIS — Z0000 Encounter for general adult medical examination without abnormal findings: Secondary | ICD-10-CM | POA: Diagnosis not present

## 2020-04-11 DIAGNOSIS — D6869 Other thrombophilia: Secondary | ICD-10-CM | POA: Diagnosis not present

## 2020-04-11 DIAGNOSIS — K7460 Unspecified cirrhosis of liver: Secondary | ICD-10-CM | POA: Diagnosis not present

## 2020-04-11 DIAGNOSIS — D6861 Antiphospholipid syndrome: Secondary | ICD-10-CM | POA: Diagnosis not present

## 2020-04-11 DIAGNOSIS — I10 Essential (primary) hypertension: Secondary | ICD-10-CM | POA: Diagnosis not present

## 2020-04-11 DIAGNOSIS — G479 Sleep disorder, unspecified: Secondary | ICD-10-CM | POA: Diagnosis not present

## 2020-04-11 DIAGNOSIS — Z862 Personal history of diseases of the blood and blood-forming organs and certain disorders involving the immune mechanism: Secondary | ICD-10-CM | POA: Diagnosis not present

## 2020-04-11 DIAGNOSIS — Z7901 Long term (current) use of anticoagulants: Secondary | ICD-10-CM | POA: Diagnosis not present

## 2020-04-11 DIAGNOSIS — E1142 Type 2 diabetes mellitus with diabetic polyneuropathy: Secondary | ICD-10-CM | POA: Diagnosis not present

## 2020-04-11 DIAGNOSIS — E7800 Pure hypercholesterolemia, unspecified: Secondary | ICD-10-CM | POA: Diagnosis not present

## 2020-05-01 DIAGNOSIS — E1142 Type 2 diabetes mellitus with diabetic polyneuropathy: Secondary | ICD-10-CM | POA: Diagnosis not present

## 2020-05-01 DIAGNOSIS — M89371 Hypertrophy of bone, right ankle and foot: Secondary | ICD-10-CM | POA: Diagnosis not present

## 2020-05-01 DIAGNOSIS — E119 Type 2 diabetes mellitus without complications: Secondary | ICD-10-CM | POA: Diagnosis not present

## 2020-05-01 DIAGNOSIS — M205X1 Other deformities of toe(s) (acquired), right foot: Secondary | ICD-10-CM | POA: Diagnosis not present

## 2020-05-09 DIAGNOSIS — Z7901 Long term (current) use of anticoagulants: Secondary | ICD-10-CM | POA: Diagnosis not present

## 2020-05-09 DIAGNOSIS — I10 Essential (primary) hypertension: Secondary | ICD-10-CM | POA: Diagnosis not present

## 2020-05-09 DIAGNOSIS — E1142 Type 2 diabetes mellitus with diabetic polyneuropathy: Secondary | ICD-10-CM | POA: Diagnosis not present

## 2020-05-09 DIAGNOSIS — E7800 Pure hypercholesterolemia, unspecified: Secondary | ICD-10-CM | POA: Diagnosis not present

## 2020-05-15 DIAGNOSIS — H2513 Age-related nuclear cataract, bilateral: Secondary | ICD-10-CM | POA: Diagnosis not present

## 2020-05-15 DIAGNOSIS — H25013 Cortical age-related cataract, bilateral: Secondary | ICD-10-CM | POA: Diagnosis not present

## 2020-05-15 DIAGNOSIS — H2512 Age-related nuclear cataract, left eye: Secondary | ICD-10-CM | POA: Diagnosis not present

## 2020-05-15 DIAGNOSIS — E119 Type 2 diabetes mellitus without complications: Secondary | ICD-10-CM | POA: Diagnosis not present

## 2020-05-15 DIAGNOSIS — H40013 Open angle with borderline findings, low risk, bilateral: Secondary | ICD-10-CM | POA: Diagnosis not present

## 2020-06-19 DIAGNOSIS — Z7901 Long term (current) use of anticoagulants: Secondary | ICD-10-CM | POA: Diagnosis not present

## 2020-07-23 DIAGNOSIS — B379 Candidiasis, unspecified: Secondary | ICD-10-CM | POA: Diagnosis not present

## 2020-07-23 DIAGNOSIS — E1142 Type 2 diabetes mellitus with diabetic polyneuropathy: Secondary | ICD-10-CM | POA: Diagnosis not present

## 2020-07-26 DIAGNOSIS — E7800 Pure hypercholesterolemia, unspecified: Secondary | ICD-10-CM | POA: Diagnosis not present

## 2020-07-26 DIAGNOSIS — E1142 Type 2 diabetes mellitus with diabetic polyneuropathy: Secondary | ICD-10-CM | POA: Diagnosis not present

## 2020-07-26 DIAGNOSIS — I10 Essential (primary) hypertension: Secondary | ICD-10-CM | POA: Diagnosis not present

## 2020-07-26 DIAGNOSIS — M179 Osteoarthritis of knee, unspecified: Secondary | ICD-10-CM | POA: Diagnosis not present

## 2020-08-14 DIAGNOSIS — H2513 Age-related nuclear cataract, bilateral: Secondary | ICD-10-CM | POA: Diagnosis not present

## 2020-08-14 DIAGNOSIS — H25013 Cortical age-related cataract, bilateral: Secondary | ICD-10-CM | POA: Diagnosis not present

## 2020-08-14 DIAGNOSIS — E119 Type 2 diabetes mellitus without complications: Secondary | ICD-10-CM | POA: Diagnosis not present

## 2020-08-14 DIAGNOSIS — H2512 Age-related nuclear cataract, left eye: Secondary | ICD-10-CM | POA: Diagnosis not present

## 2020-08-14 DIAGNOSIS — H40013 Open angle with borderline findings, low risk, bilateral: Secondary | ICD-10-CM | POA: Diagnosis not present

## 2020-08-15 DIAGNOSIS — Z7901 Long term (current) use of anticoagulants: Secondary | ICD-10-CM | POA: Diagnosis not present

## 2020-08-22 DIAGNOSIS — H25812 Combined forms of age-related cataract, left eye: Secondary | ICD-10-CM | POA: Diagnosis not present

## 2020-08-22 DIAGNOSIS — H2512 Age-related nuclear cataract, left eye: Secondary | ICD-10-CM | POA: Diagnosis not present

## 2020-08-23 DIAGNOSIS — H2511 Age-related nuclear cataract, right eye: Secondary | ICD-10-CM | POA: Diagnosis not present

## 2020-08-26 DIAGNOSIS — E1142 Type 2 diabetes mellitus with diabetic polyneuropathy: Secondary | ICD-10-CM | POA: Diagnosis not present

## 2020-08-26 DIAGNOSIS — M179 Osteoarthritis of knee, unspecified: Secondary | ICD-10-CM | POA: Diagnosis not present

## 2020-08-26 DIAGNOSIS — I10 Essential (primary) hypertension: Secondary | ICD-10-CM | POA: Diagnosis not present

## 2020-08-26 DIAGNOSIS — E119 Type 2 diabetes mellitus without complications: Secondary | ICD-10-CM | POA: Diagnosis not present

## 2020-08-26 DIAGNOSIS — E7800 Pure hypercholesterolemia, unspecified: Secondary | ICD-10-CM | POA: Diagnosis not present

## 2020-09-05 DIAGNOSIS — H25811 Combined forms of age-related cataract, right eye: Secondary | ICD-10-CM | POA: Diagnosis not present

## 2020-09-05 DIAGNOSIS — H2511 Age-related nuclear cataract, right eye: Secondary | ICD-10-CM | POA: Diagnosis not present

## 2020-09-16 DIAGNOSIS — Z7901 Long term (current) use of anticoagulants: Secondary | ICD-10-CM | POA: Diagnosis not present

## 2020-09-16 DIAGNOSIS — E1142 Type 2 diabetes mellitus with diabetic polyneuropathy: Secondary | ICD-10-CM | POA: Diagnosis not present

## 2020-09-16 DIAGNOSIS — Z86711 Personal history of pulmonary embolism: Secondary | ICD-10-CM | POA: Diagnosis not present

## 2020-09-16 DIAGNOSIS — R413 Other amnesia: Secondary | ICD-10-CM | POA: Diagnosis not present

## 2020-09-20 DIAGNOSIS — E7800 Pure hypercholesterolemia, unspecified: Secondary | ICD-10-CM | POA: Diagnosis not present

## 2020-09-20 DIAGNOSIS — E119 Type 2 diabetes mellitus without complications: Secondary | ICD-10-CM | POA: Diagnosis not present

## 2020-09-20 DIAGNOSIS — I10 Essential (primary) hypertension: Secondary | ICD-10-CM | POA: Diagnosis not present

## 2020-09-20 DIAGNOSIS — M179 Osteoarthritis of knee, unspecified: Secondary | ICD-10-CM | POA: Diagnosis not present

## 2020-09-20 DIAGNOSIS — E1142 Type 2 diabetes mellitus with diabetic polyneuropathy: Secondary | ICD-10-CM | POA: Diagnosis not present

## 2020-10-02 DIAGNOSIS — Z23 Encounter for immunization: Secondary | ICD-10-CM | POA: Diagnosis not present

## 2020-10-02 DIAGNOSIS — Z7901 Long term (current) use of anticoagulants: Secondary | ICD-10-CM | POA: Diagnosis not present

## 2020-11-01 DIAGNOSIS — E119 Type 2 diabetes mellitus without complications: Secondary | ICD-10-CM | POA: Diagnosis not present

## 2020-11-01 DIAGNOSIS — M179 Osteoarthritis of knee, unspecified: Secondary | ICD-10-CM | POA: Diagnosis not present

## 2020-11-01 DIAGNOSIS — I10 Essential (primary) hypertension: Secondary | ICD-10-CM | POA: Diagnosis not present

## 2020-11-01 DIAGNOSIS — E7800 Pure hypercholesterolemia, unspecified: Secondary | ICD-10-CM | POA: Diagnosis not present

## 2020-11-01 DIAGNOSIS — E1142 Type 2 diabetes mellitus with diabetic polyneuropathy: Secondary | ICD-10-CM | POA: Diagnosis not present

## 2020-11-04 DIAGNOSIS — Z7901 Long term (current) use of anticoagulants: Secondary | ICD-10-CM | POA: Diagnosis not present

## 2020-11-30 DIAGNOSIS — Z87442 Personal history of urinary calculi: Secondary | ICD-10-CM | POA: Diagnosis not present

## 2020-11-30 DIAGNOSIS — K0889 Other specified disorders of teeth and supporting structures: Secondary | ICD-10-CM | POA: Diagnosis not present

## 2020-11-30 DIAGNOSIS — K029 Dental caries, unspecified: Secondary | ICD-10-CM | POA: Diagnosis not present

## 2020-11-30 DIAGNOSIS — Z86718 Personal history of other venous thrombosis and embolism: Secondary | ICD-10-CM | POA: Diagnosis not present

## 2020-11-30 DIAGNOSIS — Z881 Allergy status to other antibiotic agents status: Secondary | ICD-10-CM | POA: Diagnosis not present

## 2020-11-30 DIAGNOSIS — E119 Type 2 diabetes mellitus without complications: Secondary | ICD-10-CM | POA: Diagnosis not present

## 2020-11-30 DIAGNOSIS — I10 Essential (primary) hypertension: Secondary | ICD-10-CM | POA: Diagnosis not present

## 2020-11-30 DIAGNOSIS — Z8673 Personal history of transient ischemic attack (TIA), and cerebral infarction without residual deficits: Secondary | ICD-10-CM | POA: Diagnosis not present

## 2020-11-30 DIAGNOSIS — Z8616 Personal history of COVID-19: Secondary | ICD-10-CM | POA: Diagnosis not present

## 2020-11-30 DIAGNOSIS — Z7984 Long term (current) use of oral hypoglycemic drugs: Secondary | ICD-10-CM | POA: Diagnosis not present

## 2020-11-30 DIAGNOSIS — Z888 Allergy status to other drugs, medicaments and biological substances status: Secondary | ICD-10-CM | POA: Diagnosis not present

## 2020-11-30 DIAGNOSIS — Z79899 Other long term (current) drug therapy: Secondary | ICD-10-CM | POA: Diagnosis not present

## 2020-11-30 DIAGNOSIS — Z7901 Long term (current) use of anticoagulants: Secondary | ICD-10-CM | POA: Diagnosis not present

## 2020-11-30 DIAGNOSIS — Z87891 Personal history of nicotine dependence: Secondary | ICD-10-CM | POA: Diagnosis not present

## 2020-11-30 DIAGNOSIS — D689 Coagulation defect, unspecified: Secondary | ICD-10-CM | POA: Diagnosis not present

## 2020-12-02 DIAGNOSIS — E1142 Type 2 diabetes mellitus with diabetic polyneuropathy: Secondary | ICD-10-CM | POA: Diagnosis not present

## 2020-12-02 DIAGNOSIS — M179 Osteoarthritis of knee, unspecified: Secondary | ICD-10-CM | POA: Diagnosis not present

## 2020-12-02 DIAGNOSIS — E7800 Pure hypercholesterolemia, unspecified: Secondary | ICD-10-CM | POA: Diagnosis not present

## 2020-12-02 DIAGNOSIS — I10 Essential (primary) hypertension: Secondary | ICD-10-CM | POA: Diagnosis not present

## 2020-12-03 DIAGNOSIS — Z7901 Long term (current) use of anticoagulants: Secondary | ICD-10-CM | POA: Diagnosis not present

## 2020-12-09 DIAGNOSIS — Z7901 Long term (current) use of anticoagulants: Secondary | ICD-10-CM | POA: Diagnosis not present

## 2020-12-11 ENCOUNTER — Other Ambulatory Visit: Payer: Self-pay | Admitting: Family Medicine

## 2020-12-11 DIAGNOSIS — R935 Abnormal findings on diagnostic imaging of other abdominal regions, including retroperitoneum: Secondary | ICD-10-CM

## 2020-12-24 DIAGNOSIS — D6861 Antiphospholipid syndrome: Secondary | ICD-10-CM | POA: Diagnosis not present

## 2020-12-24 DIAGNOSIS — E1142 Type 2 diabetes mellitus with diabetic polyneuropathy: Secondary | ICD-10-CM | POA: Diagnosis not present

## 2020-12-24 DIAGNOSIS — Z86711 Personal history of pulmonary embolism: Secondary | ICD-10-CM | POA: Diagnosis not present

## 2020-12-24 DIAGNOSIS — I10 Essential (primary) hypertension: Secondary | ICD-10-CM | POA: Diagnosis not present

## 2020-12-24 DIAGNOSIS — Z7901 Long term (current) use of anticoagulants: Secondary | ICD-10-CM | POA: Diagnosis not present

## 2021-01-01 ENCOUNTER — Ambulatory Visit
Admission: RE | Admit: 2021-01-01 | Discharge: 2021-01-01 | Disposition: A | Discharge status: Home / Self Care | Payer: Medicare Other | Source: Ambulatory Visit | Attending: Family Medicine | Admitting: Family Medicine

## 2021-01-01 DIAGNOSIS — R935 Abnormal findings on diagnostic imaging of other abdominal regions, including retroperitoneum: Secondary | ICD-10-CM

## 2021-01-01 DIAGNOSIS — N281 Cyst of kidney, acquired: Secondary | ICD-10-CM | POA: Diagnosis not present

## 2021-01-01 DIAGNOSIS — K862 Cyst of pancreas: Secondary | ICD-10-CM | POA: Diagnosis not present

## 2021-01-01 DIAGNOSIS — K7689 Other specified diseases of liver: Secondary | ICD-10-CM | POA: Diagnosis not present

## 2021-01-01 DIAGNOSIS — K7460 Unspecified cirrhosis of liver: Secondary | ICD-10-CM | POA: Diagnosis not present

## 2021-01-01 MED ORDER — GADOBENATE DIMEGLUMINE 529 MG/ML IV SOLN
10.0000 mL | Freq: Once | INTRAVENOUS | Status: AC | PRN
  Administered 2021-01-01: 14:00:00 10 mL via INTRAVENOUS

## 2021-01-31 DIAGNOSIS — K3189 Other diseases of stomach and duodenum: Secondary | ICD-10-CM | POA: Diagnosis not present

## 2021-01-31 DIAGNOSIS — Z7901 Long term (current) use of anticoagulants: Secondary | ICD-10-CM | POA: Diagnosis not present

## 2021-01-31 DIAGNOSIS — N200 Calculus of kidney: Secondary | ICD-10-CM | POA: Diagnosis not present

## 2021-01-31 DIAGNOSIS — R300 Dysuria: Secondary | ICD-10-CM | POA: Diagnosis not present

## 2021-01-31 DIAGNOSIS — R1084 Generalized abdominal pain: Secondary | ICD-10-CM | POA: Diagnosis not present

## 2021-01-31 DIAGNOSIS — Z87891 Personal history of nicotine dependence: Secondary | ICD-10-CM | POA: Diagnosis not present

## 2021-01-31 DIAGNOSIS — Z8616 Personal history of COVID-19: Secondary | ICD-10-CM | POA: Diagnosis not present

## 2021-01-31 DIAGNOSIS — Z881 Allergy status to other antibiotic agents status: Secondary | ICD-10-CM | POA: Diagnosis not present

## 2021-01-31 DIAGNOSIS — I10 Essential (primary) hypertension: Secondary | ICD-10-CM | POA: Diagnosis not present

## 2021-01-31 DIAGNOSIS — Z7984 Long term (current) use of oral hypoglycemic drugs: Secondary | ICD-10-CM | POA: Diagnosis not present

## 2021-01-31 DIAGNOSIS — R3915 Urgency of urination: Secondary | ICD-10-CM | POA: Diagnosis not present

## 2021-01-31 DIAGNOSIS — E119 Type 2 diabetes mellitus without complications: Secondary | ICD-10-CM | POA: Diagnosis not present

## 2021-01-31 DIAGNOSIS — Z86718 Personal history of other venous thrombosis and embolism: Secondary | ICD-10-CM | POA: Diagnosis not present

## 2021-01-31 DIAGNOSIS — K7460 Unspecified cirrhosis of liver: Secondary | ICD-10-CM | POA: Diagnosis not present

## 2021-01-31 DIAGNOSIS — U071 COVID-19: Secondary | ICD-10-CM | POA: Diagnosis not present

## 2021-01-31 DIAGNOSIS — R059 Cough, unspecified: Secondary | ICD-10-CM | POA: Diagnosis not present

## 2021-01-31 DIAGNOSIS — N281 Cyst of kidney, acquired: Secondary | ICD-10-CM | POA: Diagnosis not present

## 2021-01-31 DIAGNOSIS — Z86711 Personal history of pulmonary embolism: Secondary | ICD-10-CM | POA: Diagnosis not present

## 2021-01-31 DIAGNOSIS — M1990 Unspecified osteoarthritis, unspecified site: Secondary | ICD-10-CM | POA: Diagnosis not present

## 2021-01-31 DIAGNOSIS — Z8673 Personal history of transient ischemic attack (TIA), and cerebral infarction without residual deficits: Secondary | ICD-10-CM | POA: Diagnosis not present

## 2021-01-31 DIAGNOSIS — Z79899 Other long term (current) drug therapy: Secondary | ICD-10-CM | POA: Diagnosis not present

## 2021-01-31 DIAGNOSIS — Z888 Allergy status to other drugs, medicaments and biological substances status: Secondary | ICD-10-CM | POA: Diagnosis not present

## 2021-02-11 DIAGNOSIS — Z7901 Long term (current) use of anticoagulants: Secondary | ICD-10-CM | POA: Diagnosis not present

## 2021-03-21 DIAGNOSIS — I10 Essential (primary) hypertension: Secondary | ICD-10-CM | POA: Diagnosis not present

## 2021-03-21 DIAGNOSIS — E7800 Pure hypercholesterolemia, unspecified: Secondary | ICD-10-CM | POA: Diagnosis not present

## 2021-03-21 DIAGNOSIS — E1142 Type 2 diabetes mellitus with diabetic polyneuropathy: Secondary | ICD-10-CM | POA: Diagnosis not present

## 2021-04-08 DIAGNOSIS — Z7901 Long term (current) use of anticoagulants: Secondary | ICD-10-CM | POA: Diagnosis not present

## 2021-04-18 DIAGNOSIS — Z862 Personal history of diseases of the blood and blood-forming organs and certain disorders involving the immune mechanism: Secondary | ICD-10-CM | POA: Diagnosis not present

## 2021-04-18 DIAGNOSIS — E7800 Pure hypercholesterolemia, unspecified: Secondary | ICD-10-CM | POA: Diagnosis not present

## 2021-04-18 DIAGNOSIS — E1142 Type 2 diabetes mellitus with diabetic polyneuropathy: Secondary | ICD-10-CM | POA: Diagnosis not present

## 2021-04-18 DIAGNOSIS — I10 Essential (primary) hypertension: Secondary | ICD-10-CM | POA: Diagnosis not present

## 2021-05-05 DIAGNOSIS — Z862 Personal history of diseases of the blood and blood-forming organs and certain disorders involving the immune mechanism: Secondary | ICD-10-CM | POA: Diagnosis not present

## 2021-05-05 DIAGNOSIS — D6861 Antiphospholipid syndrome: Secondary | ICD-10-CM | POA: Diagnosis not present

## 2021-05-05 DIAGNOSIS — G479 Sleep disorder, unspecified: Secondary | ICD-10-CM | POA: Diagnosis not present

## 2021-05-05 DIAGNOSIS — I10 Essential (primary) hypertension: Secondary | ICD-10-CM | POA: Diagnosis not present

## 2021-05-05 DIAGNOSIS — E1142 Type 2 diabetes mellitus with diabetic polyneuropathy: Secondary | ICD-10-CM | POA: Diagnosis not present

## 2021-05-05 DIAGNOSIS — K7460 Unspecified cirrhosis of liver: Secondary | ICD-10-CM | POA: Diagnosis not present

## 2021-05-05 DIAGNOSIS — Z23 Encounter for immunization: Secondary | ICD-10-CM | POA: Diagnosis not present

## 2021-05-05 DIAGNOSIS — Z0000 Encounter for general adult medical examination without abnormal findings: Secondary | ICD-10-CM | POA: Diagnosis not present

## 2021-05-05 DIAGNOSIS — D6869 Other thrombophilia: Secondary | ICD-10-CM | POA: Diagnosis not present

## 2021-05-05 DIAGNOSIS — E7800 Pure hypercholesterolemia, unspecified: Secondary | ICD-10-CM | POA: Diagnosis not present

## 2021-06-05 DIAGNOSIS — Z7901 Long term (current) use of anticoagulants: Secondary | ICD-10-CM | POA: Diagnosis not present

## 2021-08-07 DIAGNOSIS — Z7901 Long term (current) use of anticoagulants: Secondary | ICD-10-CM | POA: Diagnosis not present

## 2021-09-04 DIAGNOSIS — E113211 Type 2 diabetes mellitus with mild nonproliferative diabetic retinopathy with macular edema, right eye: Secondary | ICD-10-CM | POA: Diagnosis not present

## 2021-11-10 DIAGNOSIS — Z23 Encounter for immunization: Secondary | ICD-10-CM | POA: Diagnosis not present

## 2021-11-10 DIAGNOSIS — M25512 Pain in left shoulder: Secondary | ICD-10-CM | POA: Diagnosis not present

## 2021-11-10 DIAGNOSIS — Z7901 Long term (current) use of anticoagulants: Secondary | ICD-10-CM | POA: Diagnosis not present

## 2021-11-10 DIAGNOSIS — D6869 Other thrombophilia: Secondary | ICD-10-CM | POA: Diagnosis not present

## 2021-11-10 DIAGNOSIS — E1142 Type 2 diabetes mellitus with diabetic polyneuropathy: Secondary | ICD-10-CM | POA: Diagnosis not present

## 2022-02-20 DIAGNOSIS — Z7984 Long term (current) use of oral hypoglycemic drugs: Secondary | ICD-10-CM | POA: Diagnosis not present

## 2022-02-20 DIAGNOSIS — I10 Essential (primary) hypertension: Secondary | ICD-10-CM | POA: Diagnosis not present

## 2022-02-20 DIAGNOSIS — Z8673 Personal history of transient ischemic attack (TIA), and cerebral infarction without residual deficits: Secondary | ICD-10-CM | POA: Diagnosis not present

## 2022-02-20 DIAGNOSIS — Z7901 Long term (current) use of anticoagulants: Secondary | ICD-10-CM | POA: Diagnosis not present

## 2022-02-20 DIAGNOSIS — L03032 Cellulitis of left toe: Secondary | ICD-10-CM | POA: Diagnosis not present

## 2022-02-20 DIAGNOSIS — E1140 Type 2 diabetes mellitus with diabetic neuropathy, unspecified: Secondary | ICD-10-CM | POA: Diagnosis not present

## 2022-02-23 DIAGNOSIS — L03032 Cellulitis of left toe: Secondary | ICD-10-CM | POA: Diagnosis not present

## 2022-02-23 DIAGNOSIS — M79675 Pain in left toe(s): Secondary | ICD-10-CM | POA: Diagnosis not present

## 2022-02-23 DIAGNOSIS — E1140 Type 2 diabetes mellitus with diabetic neuropathy, unspecified: Secondary | ICD-10-CM | POA: Diagnosis not present

## 2022-02-23 DIAGNOSIS — S91202A Unspecified open wound of left great toe with damage to nail, initial encounter: Secondary | ICD-10-CM | POA: Diagnosis not present

## 2022-02-23 DIAGNOSIS — L600 Ingrowing nail: Secondary | ICD-10-CM | POA: Diagnosis not present

## 2022-03-02 DIAGNOSIS — E13621 Other specified diabetes mellitus with foot ulcer: Secondary | ICD-10-CM | POA: Diagnosis not present

## 2022-03-02 DIAGNOSIS — L03032 Cellulitis of left toe: Secondary | ICD-10-CM | POA: Diagnosis not present

## 2022-03-06 DIAGNOSIS — E1140 Type 2 diabetes mellitus with diabetic neuropathy, unspecified: Secondary | ICD-10-CM | POA: Diagnosis not present

## 2022-03-06 DIAGNOSIS — L03032 Cellulitis of left toe: Secondary | ICD-10-CM | POA: Diagnosis not present

## 2022-03-06 DIAGNOSIS — T8140XA Infection following a procedure, unspecified, initial encounter: Secondary | ICD-10-CM | POA: Diagnosis not present

## 2022-03-09 DIAGNOSIS — E13621 Other specified diabetes mellitus with foot ulcer: Secondary | ICD-10-CM | POA: Diagnosis not present

## 2022-03-09 DIAGNOSIS — E1140 Type 2 diabetes mellitus with diabetic neuropathy, unspecified: Secondary | ICD-10-CM | POA: Diagnosis not present

## 2022-03-09 DIAGNOSIS — L03032 Cellulitis of left toe: Secondary | ICD-10-CM | POA: Diagnosis not present

## 2022-03-09 DIAGNOSIS — M79675 Pain in left toe(s): Secondary | ICD-10-CM | POA: Diagnosis not present

## 2022-03-16 DIAGNOSIS — L03032 Cellulitis of left toe: Secondary | ICD-10-CM | POA: Diagnosis not present

## 2022-03-16 DIAGNOSIS — E1140 Type 2 diabetes mellitus with diabetic neuropathy, unspecified: Secondary | ICD-10-CM | POA: Diagnosis not present

## 2022-03-30 DIAGNOSIS — L03032 Cellulitis of left toe: Secondary | ICD-10-CM | POA: Diagnosis not present

## 2022-03-30 DIAGNOSIS — E1140 Type 2 diabetes mellitus with diabetic neuropathy, unspecified: Secondary | ICD-10-CM | POA: Diagnosis not present

## 2022-03-30 DIAGNOSIS — E13621 Other specified diabetes mellitus with foot ulcer: Secondary | ICD-10-CM | POA: Diagnosis not present

## 2022-05-26 DIAGNOSIS — R413 Other amnesia: Secondary | ICD-10-CM | POA: Diagnosis not present

## 2022-05-26 DIAGNOSIS — E7800 Pure hypercholesterolemia, unspecified: Secondary | ICD-10-CM | POA: Diagnosis not present

## 2022-05-26 DIAGNOSIS — D6861 Antiphospholipid syndrome: Secondary | ICD-10-CM | POA: Diagnosis not present

## 2022-05-26 DIAGNOSIS — D696 Thrombocytopenia, unspecified: Secondary | ICD-10-CM | POA: Diagnosis not present

## 2022-05-26 DIAGNOSIS — G479 Sleep disorder, unspecified: Secondary | ICD-10-CM | POA: Diagnosis not present

## 2022-05-26 DIAGNOSIS — Z7901 Long term (current) use of anticoagulants: Secondary | ICD-10-CM | POA: Diagnosis not present

## 2022-05-26 DIAGNOSIS — Z0000 Encounter for general adult medical examination without abnormal findings: Secondary | ICD-10-CM | POA: Diagnosis not present

## 2022-05-26 DIAGNOSIS — E1142 Type 2 diabetes mellitus with diabetic polyneuropathy: Secondary | ICD-10-CM | POA: Diagnosis not present

## 2022-05-26 DIAGNOSIS — K7460 Unspecified cirrhosis of liver: Secondary | ICD-10-CM | POA: Diagnosis not present

## 2022-05-26 DIAGNOSIS — I10 Essential (primary) hypertension: Secondary | ICD-10-CM | POA: Diagnosis not present

## 2022-09-01 DIAGNOSIS — E113293 Type 2 diabetes mellitus with mild nonproliferative diabetic retinopathy without macular edema, bilateral: Secondary | ICD-10-CM | POA: Diagnosis not present

## 2022-11-10 DIAGNOSIS — R413 Other amnesia: Secondary | ICD-10-CM | POA: Diagnosis not present

## 2022-11-10 DIAGNOSIS — Z23 Encounter for immunization: Secondary | ICD-10-CM | POA: Diagnosis not present

## 2022-11-10 DIAGNOSIS — I10 Essential (primary) hypertension: Secondary | ICD-10-CM | POA: Diagnosis not present

## 2022-11-10 DIAGNOSIS — I499 Cardiac arrhythmia, unspecified: Secondary | ICD-10-CM | POA: Diagnosis not present

## 2022-11-10 DIAGNOSIS — E1142 Type 2 diabetes mellitus with diabetic polyneuropathy: Secondary | ICD-10-CM | POA: Diagnosis not present

## 2022-11-10 DIAGNOSIS — D6861 Antiphospholipid syndrome: Secondary | ICD-10-CM | POA: Diagnosis not present

## 2022-11-10 DIAGNOSIS — E7800 Pure hypercholesterolemia, unspecified: Secondary | ICD-10-CM | POA: Diagnosis not present

## 2023-04-01 DIAGNOSIS — U071 COVID-19: Secondary | ICD-10-CM | POA: Diagnosis not present

## 2023-04-01 DIAGNOSIS — E119 Type 2 diabetes mellitus without complications: Secondary | ICD-10-CM | POA: Diagnosis not present

## 2023-04-01 DIAGNOSIS — R059 Cough, unspecified: Secondary | ICD-10-CM | POA: Diagnosis not present

## 2023-04-01 DIAGNOSIS — R0981 Nasal congestion: Secondary | ICD-10-CM | POA: Diagnosis not present

## 2023-06-09 DIAGNOSIS — D696 Thrombocytopenia, unspecified: Secondary | ICD-10-CM | POA: Diagnosis not present

## 2023-06-09 DIAGNOSIS — Z862 Personal history of diseases of the blood and blood-forming organs and certain disorders involving the immune mechanism: Secondary | ICD-10-CM | POA: Diagnosis not present

## 2023-06-09 DIAGNOSIS — Z0000 Encounter for general adult medical examination without abnormal findings: Secondary | ICD-10-CM | POA: Diagnosis not present

## 2023-06-09 DIAGNOSIS — I10 Essential (primary) hypertension: Secondary | ICD-10-CM | POA: Diagnosis not present

## 2023-06-09 DIAGNOSIS — D6869 Other thrombophilia: Secondary | ICD-10-CM | POA: Diagnosis not present

## 2023-06-09 DIAGNOSIS — Z23 Encounter for immunization: Secondary | ICD-10-CM | POA: Diagnosis not present

## 2023-06-09 DIAGNOSIS — E7800 Pure hypercholesterolemia, unspecified: Secondary | ICD-10-CM | POA: Diagnosis not present

## 2023-06-09 DIAGNOSIS — E1142 Type 2 diabetes mellitus with diabetic polyneuropathy: Secondary | ICD-10-CM | POA: Diagnosis not present

## 2023-06-09 DIAGNOSIS — G479 Sleep disorder, unspecified: Secondary | ICD-10-CM | POA: Diagnosis not present

## 2023-12-22 ENCOUNTER — Other Ambulatory Visit: Payer: Self-pay | Admitting: Family Medicine

## 2023-12-22 DIAGNOSIS — R935 Abnormal findings on diagnostic imaging of other abdominal regions, including retroperitoneum: Secondary | ICD-10-CM
